# Patient Record
Sex: Female | Born: 1980 | Race: White | Hispanic: No | Smoking: Never smoker
Health system: Southern US, Community
[De-identification: ages and names within clinical notes are randomized; demographics above are authoritative.]

## PROBLEM LIST (undated history)

## (undated) DIAGNOSIS — M722 Plantar fascial fibromatosis: Secondary | ICD-10-CM

## (undated) DIAGNOSIS — R112 Nausea with vomiting, unspecified: Secondary | ICD-10-CM

## (undated) DIAGNOSIS — Z9889 Other specified postprocedural states: Secondary | ICD-10-CM

## (undated) DIAGNOSIS — K5792 Diverticulitis of intestine, part unspecified, without perforation or abscess without bleeding: Secondary | ICD-10-CM

## (undated) DIAGNOSIS — K219 Gastro-esophageal reflux disease without esophagitis: Secondary | ICD-10-CM

## (undated) DIAGNOSIS — N3010 Interstitial cystitis (chronic) without hematuria: Secondary | ICD-10-CM

## (undated) DIAGNOSIS — J45909 Unspecified asthma, uncomplicated: Secondary | ICD-10-CM

## (undated) DIAGNOSIS — Z87440 Personal history of urinary (tract) infections: Secondary | ICD-10-CM

## (undated) DIAGNOSIS — Z8619 Personal history of other infectious and parasitic diseases: Secondary | ICD-10-CM

## (undated) DIAGNOSIS — R17 Unspecified jaundice: Secondary | ICD-10-CM

## (undated) DIAGNOSIS — R519 Headache, unspecified: Secondary | ICD-10-CM

## (undated) DIAGNOSIS — Z8669 Personal history of other diseases of the nervous system and sense organs: Secondary | ICD-10-CM

## (undated) DIAGNOSIS — A1801 Tuberculosis of spine: Secondary | ICD-10-CM

## (undated) DIAGNOSIS — R51 Headache: Secondary | ICD-10-CM

## (undated) DIAGNOSIS — M797 Fibromyalgia: Secondary | ICD-10-CM

## (undated) DIAGNOSIS — M7072 Other bursitis of hip, left hip: Secondary | ICD-10-CM

## (undated) HISTORY — DX: Other bursitis of hip, left hip: M70.72

## (undated) HISTORY — DX: Unspecified jaundice: R17

## (undated) HISTORY — DX: Unspecified asthma, uncomplicated: J45.909

## (undated) HISTORY — DX: Other specified postprocedural states: Z98.890

## (undated) HISTORY — DX: Headache: R51

## (undated) HISTORY — DX: Personal history of urinary (tract) infections: Z87.440

## (undated) HISTORY — PX: CYSTOSCOPY WITH HYDRODISTENSION AND BIOPSY: SHX5127

## (undated) HISTORY — DX: Tuberculosis of spine: A18.01

## (undated) HISTORY — DX: Plantar fascial fibromatosis: M72.2

## (undated) HISTORY — DX: Diverticulitis of intestine, part unspecified, without perforation or abscess without bleeding: K57.92

## (undated) HISTORY — DX: Personal history of other infectious and parasitic diseases: Z86.19

## (undated) HISTORY — DX: Headache, unspecified: R51.9

## (undated) HISTORY — DX: Personal history of other diseases of the nervous system and sense organs: Z86.69

## (undated) HISTORY — DX: Fibromyalgia: M79.7

## (undated) HISTORY — DX: Interstitial cystitis (chronic) without hematuria: N30.10

## (undated) HISTORY — DX: Gastro-esophageal reflux disease without esophagitis: K21.9

---

## 2003-02-16 HISTORY — PX: DIAGNOSTIC LAPAROSCOPY: SUR761

## 2003-12-27 ENCOUNTER — Ambulatory Visit: Payer: Self-pay

## 2004-01-03 ENCOUNTER — Ambulatory Visit: Payer: Self-pay | Admitting: Urology

## 2004-01-03 DIAGNOSIS — N3010 Interstitial cystitis (chronic) without hematuria: Secondary | ICD-10-CM

## 2004-01-03 HISTORY — DX: Interstitial cystitis (chronic) without hematuria: N30.10

## 2004-05-21 ENCOUNTER — Ambulatory Visit: Payer: Self-pay

## 2005-12-16 ENCOUNTER — Ambulatory Visit: Payer: Self-pay

## 2006-01-13 ENCOUNTER — Inpatient Hospital Stay (HOSPITAL_COMMUNITY): Admission: AD | Admit: 2006-01-13 | Discharge: 2006-01-15 | Payer: Self-pay | Admitting: Neurology

## 2006-01-29 ENCOUNTER — Ambulatory Visit: Payer: Self-pay | Admitting: Obstetrics and Gynecology

## 2006-01-29 DIAGNOSIS — Z9889 Other specified postprocedural states: Secondary | ICD-10-CM

## 2006-01-29 HISTORY — DX: Other specified postprocedural states: Z98.890

## 2007-12-14 ENCOUNTER — Ambulatory Visit: Payer: Self-pay | Admitting: Family Medicine

## 2007-12-20 ENCOUNTER — Ambulatory Visit: Payer: Self-pay | Admitting: Nurse Practitioner

## 2008-01-10 ENCOUNTER — Ambulatory Visit: Payer: Self-pay | Admitting: Nurse Practitioner

## 2008-03-06 ENCOUNTER — Ambulatory Visit: Payer: Self-pay | Admitting: Nurse Practitioner

## 2008-05-15 ENCOUNTER — Ambulatory Visit: Payer: Self-pay | Admitting: Nurse Practitioner

## 2008-05-22 ENCOUNTER — Ambulatory Visit: Payer: Self-pay | Admitting: Family Medicine

## 2008-06-12 ENCOUNTER — Ambulatory Visit: Payer: Self-pay | Admitting: Nurse Practitioner

## 2008-10-09 ENCOUNTER — Ambulatory Visit: Payer: Self-pay | Admitting: Family Medicine

## 2008-10-09 ENCOUNTER — Encounter: Payer: Self-pay | Admitting: Family Medicine

## 2008-11-08 ENCOUNTER — Ambulatory Visit: Payer: Self-pay | Admitting: Rheumatology

## 2008-12-25 ENCOUNTER — Ambulatory Visit: Payer: Self-pay | Admitting: Obstetrics & Gynecology

## 2010-04-10 ENCOUNTER — Ambulatory Visit: Payer: Self-pay

## 2010-04-10 DIAGNOSIS — T192XXA Foreign body in vulva and vagina, initial encounter: Secondary | ICD-10-CM

## 2010-05-09 NOTE — Assessment & Plan Note (Signed)
NAME:  Margaret, Silva          ACCOUNT NO.:  [id number]  MEDICAL RECORD NO.:  [id number]           PATIENT TYPE:  LOCATION:  [facility] at [facility]           FACILITY:  PHYSICIAN:  Catalina Antigua, MD          DATE OF BIRTH:  DATE OF SERVICE:  04/10/2010                                 CLINIC NOTE  This is a 30 year old who presents to the office today for removal of a condom.  The patient states that after having intercourse yesterday evening they could not find condom and it seems that it was dropped in the vagina.  The patient was not able to palpate it or feel to remove the condom and thus presents today.  The patient is without any other complaints.  She is using IUD for birth control.  PHYSICAL EXAMINATION:  Blood pressure is 88/66, pulse of 112, weight of 174 pounds.  On pelvic exam, she had normal vaginal mucosa and normal- appearing cervix.  No abnormal bleeding or discharge and condom was visualized in the posterior vaginal fornix and removed with a ring forceps without any difficulty.  The patient tolerated procedure well. The patient is due for her annual exam in August 2012.          ______________________________ Catalina Antigua, MD    PC/MEDQ  D:  04/10/2010  T:  04/11/2010  Job:  [id number]

## 2010-05-26 HISTORY — PX: SIGMOIDOSCOPY: SUR1295

## 2010-07-01 NOTE — Assessment & Plan Note (Signed)
Margaret Silva, FAROOQ          ACCOUNT NO.:  [id number]   MEDICAL RECORD NO.:  [id number]          PATIENT TYPE:  POB   LOCATION:  [facility] at [facility]         FACILITY:  [facility]   PHYSICIAN:  Johnella Moloney, MD        DATE OF BIRTH:  [birth year]   DATE OF SERVICE:  03/06/2008                                  CLINIC NOTE   HISTORY OF PRESENT ILLNESS:  The patient comes to the office today,  followup up on her migraine headaches.  The patient has been going to  integrative therapy approximately 1-2 times per week.  She has yet not  seen a Veterinary surgeon.  She does feel that she has more depression that work  is more stressful and she is more easily irritated with people at work.  She does feel that she has some seasonal depression.  She does not again  see a counselor or psychiatrist.  Approximately 4-6 weeks ago, her  mother called in saying that the patient was having sad headache.  She  was called in prednisone as well as a Vicodin and Phenergan.  The  patient did not respond to that.  Mother called back several times.  The  patient had been out of work for 1 week.  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  She did see Dr.  Corliss Skains.  She did get her liver tests done on February 22, 2008.  She  has not gotten those results.  She feels that the Norflex that she has  been taking for the tension-type headaches are causing her to be  nauseated.  She does feel that they further help with her headaches.  We  did review her medications, which are rather extensive.  I did want to  have a formalized listing of all the people that she sees and  medications that are given by these people.  She sees Dr. Corliss Skains who  gives her Provigil, Depakote, Darvocet, and Vicodin.  She sees Dr. Achilles Dunk  for Urology who gives her the Elmiron.  She sees a PA named Technical sales engineer at [facility] for her IBS and gets the  Cymbalta from her.  She has also  gotten that prescription from me.  She sees Dr. Gwen Pounds at [facility] for the doxycycline.  She sees Dr. Sheran Spine at [facility] for the  Claritin and her allergy injections.  She was being seen at [facility].  She is no longer being seen at [facility].  She was  getting the Provigil from them.  She also sees Dr. Dewaine Oats at  [facility] and gets Ambien and Valtrex.   ASSESSMENT AND PLAN:  1. Depression, worsening.  2. Migraine.  3. Irritable bowel syndrome.  4. Fibromyalgia.  5. Anxiety.  6. Insomnia.  7. Herpes simplex virus.  8. Attention deficit hyperactivity disorder.   ASSESSMENT AND PLAN:  We had a lengthy discussion concerning counseling.  The patient has not made an appointment to see a counselor, though she  realizes that her depression is worsening.  She  has not been seen by a  psychiatrist.  It is my opinion that she needs to be evaluated for a  worsening of her depression and her other psychiatric disorders by a  psychiatrist.  1. She is referred today to Dr. Emerson Monte to be seen as soon as      possible for depression as well as medication review and      management.  2. We will discontinue the Norflex, start her on Robaxin 750 one p.o.      t.i.d. #60 with 2 refills.  3. The patient has denied any short-term disability.  4. We do need to look for those results from her liver test to note      liver toxicity with the Depakote.  The patient is asked to return      in 2 months or sooner.      Remonia Richter, NP    ______________________________  Johnella Moloney, MD    LR/MEDQ  D:  03/06/2008  T:  03/06/2008  Job:  [id number]

## 2010-07-01 NOTE — Assessment & Plan Note (Signed)
NAME:  Margaret Silva, Margaret Silva          ACCOUNT NO.:  [id number]   MEDICAL RECORD NO.:  [id number]          PATIENT TYPE:  POB   LOCATION:  [facility] at [facility]         FACILITY:  [facility]   PHYSICIAN:  Argentina Donovan, MD        DATE OF BIRTH:  [birth year]   DATE OF SERVICE:                                  CLINIC NOTE   HISTORY OF PRESENT ILLNESS:  The patient comes to the office today for  headache consultation.  The patient is currently being seen at [facility].  She is not unhappy with her care there, but there is a  significant time trouble for her.  The patient has been having headaches  since she was approximately 62 or 30 years old.  She does not have aura  with her headache.  She currently has headaches located in her right or  left temple.  They can go to her right or left occipital region and then  over her entire head.  She currently has 5 mild headaches per month, 20  moderate headaches per month, and 4 severe headaches per month.  Coexisting illnesses include occipital neuralgia, muscle spasm, IBS,  fibromyalgia, depression, anxiety, seasonal allergies, insomnia, HSV,  ADD, and ADHD.  The patient triggers as far as she knows are mostly  involved with stress.  She is currently in some financial difficulty of  approximately 30,000 dollars, this is related to past hospitalizations  and she is currently living at home with her parents attempting to pay  off her debt.  She currently works at [employer].  She is divorced.  No  children.  Dating the same man for almost 1 year.  She denies any  problems at home.  In fact, she is doing better now with her living  situation than she has been.  She is currently using Mirena IUD since  December 2007.  Dr. Yetta Barre had put that in.  She does see Dr. Corliss Skains  for her fibromyalgia.  She does believe that she has had recent liver  checks on her Depakote.   CURRENT MEDICATIONS:  The patient is currently taking multiple  medications just to  break this down from the headache perspective.  She  is taking Depakote and Cymbalta and Frova, Vicodin and Darvocet.  For an  IBS standpoint, she takes Librarian, academic which is an over-the-counter medicine.  For her fibromyalgia, she takes Provigil.  For depression, she is on  Cymbalta.  She was taking Wellbutrin and is currently not taking it due  to money issues.  For her allergies, she takes Claritin as well as  allergy shots.  For her acne, she takes doxycycline.  For sleep, she  takes Ambien.  Over-the-counter medicines, she takes 3-4 Tylenol or  ibuprofen two times per day.  Her last Vicodin was on Halloween, she  took one.  She admits that she takes this on a relatively rare basis.  In the past, she has taken Topamax, imipramine, Relpax, and lidocaine  patches.  Medication management has included counseling, icy hot  massage, and exercise.   ALLERGIES:  The patient does not have any allergies to any medications.   MENSTRUAL HISTORY:  The patient is on  IUD.   OBSTETRICAL HISTORY:  G0, P0.   GYN HISTORY:  She has had an abnormal Pap in the past and that was in  2007.  She has had a colposcopy and a repeat Pap.  She does have HSV.   SURGICAL HISTORY:  She has had a colonoscopy.  She has had  hydrodistillation.  She has had a laparoscopy, it looks like twice.   PERSONAL MEDICAL HISTORY:  The patient has had jaundice, kidney  problems, asthma, high cholesterol, fibromyalgia, allergies, migraines,  and IBS.   SOCIAL HISTORY:  She lives at home with her mother, father, younger  sister who is 91 years old, and another sister who is currently  pregnant.  She does work outside of the home.  She does not smoke.  She  drinks one alcohol beverage per week.  She does drink caffeinated  beverages if she gets a migraine that will not go away.  She is not  being sexually abused.   REVIEW OF SYSTEMS:  The patient admits to bruising, muscle aches,  fevers, fatigue, weight gain, frequent headaches,  dizzy spells, cough,  problems with breathing, nausea, vomiting.  The patient did have the flu  last week.   PHYSICAL EXAMINATION:  VITAL SIGNS:  Blood pressure is 118/73, pulse is  98, and weight is 157.   ASSESSMENT:  1. Migraine without aura.  2. Occipital neuralgia.  3. Muscle spasm.  4. Irritable bowel syndrome.  5. Fibromyalgia.  6. Depression.  7. Anxiety.  8. Insomnia.   PLAN:  1. We spent well over 1 hour together today.  We reviewed all of her      above information and made some changes to her medication regime.      We will go upon her Depakote from 500 mg to 75 mg.  She got #45      with 3 refills.  She will contact Dr. Corliss Skains to make sure that      she had a liver panel done with her last blood draw.  2. The patient is asked eliminate all of her over-the-counter      medications.  She will substitute Norflex 100 mg one p.o. b.i.d.      p.r.n. muscle spasm, #60 with 2 refills.  The patient is given a      prescription for a TENS unit to use on her neck for muscle spasm,      this is something that she can use at work.  She is encouraged not      to use it at bedtime.  The patient is also given a prescription to      [facility] to [facility].  At [facility], they will      evaluate and treat her for any of the modalities including      biofeedback and massage or physical therapy.  We did have a      discussion about this patient's anxiety, and appears that she has a      longstanding history of some mental issues including attention      deficit disorder, attention deficit hyperactivity disorder, and      learning disability as a child.  She was asked to revisit her      counselor and consider returning to her Wellbutrin.  She will      return to this office in 2 weeks, and we will consider alternative      therapy at that time.  Remonia Richter, NP    ______________________________  Argentina Donovan, MD    LR/MEDQ  D:  12/20/2007  T:   12/21/2007  Job:  [id number]

## 2010-07-01 NOTE — Assessment & Plan Note (Signed)
NAME:  Margaret Silva, Margaret Silva          ACCOUNT NO.:  [id number]   MEDICAL RECORD NO.:  [id number]          PATIENT TYPE:  POB   LOCATION:  [facility] at [facility]         FACILITY:  [facility]   PHYSICIAN:  Argentina Donovan, MD        DATE OF BIRTH:  1980/09/13   DATE OF SERVICE:                                  CLINIC NOTE   The patient comes to office today for a followup on her migraine  headaches.  The patient has been to the counselor at [facility].  She met with her several times over several weeks.  They  discussed her job and boyfriend and home problems.  She and her  boyfriend did break up for a while related to his drinking, his temper,  and his controlling issues.  She said that things have improved.  She  also feels that things have improved at work.  She has gotten better  hours in general.  She has not seen a psychiatrist.  The patient states  that she is off the Vicodin and Darvocet.  She overall feels better.  She is going to be going out of the country in a few months with her  church, do a medical mission, and needs her tetanus and Twinrix for  hepatitis A, hepatitis B.   PHYSICAL EXAMINATION:  GENERAL:  Well-developed, well-nourished, 30-year-  old Caucasian female in no acute distress.  VITAL SIGNS:  Blood pressure is 113/78, pulse is 111, weight is 162, and  height is 5 feet 4 inches.  HEENT:  Head is normocephalic and atraumatic.  NEUROLOGIC:  The patient is alert and oriented.  She seems less sleepy  today and more alert and functioning   ASSESSMENT:  1. Migraine headache.  The patient is doing well.  She is off the      narcotics, hopefully things will improve for her.  She has not      requested any new medications nor is she asking for refill on her      narcotic.  2. Travel out of the country, the patient today was given tetanus and      Twinrix.  She will return to the office in 3 months and she was      wished good luck with her mission.      Remonia Richter, NP    ______________________________  Argentina Donovan, MD    LR/MEDQ  D:  05/15/2008  T:  05/16/2008  Job:  [id number]

## 2010-07-01 NOTE — Assessment & Plan Note (Signed)
NAME:  Margaret Silva, Margaret Silva          ACCOUNT NO.:  [id number]   MEDICAL RECORD NO.:  [id number]          PATIENT TYPE:  POB   LOCATION:  [facility] at [facility]         FACILITY:  [facility]   PHYSICIAN:  Johnella Moloney, MD        DATE OF BIRTH:  Oct 28, 1980   DATE OF SERVICE:  01/10/2008                                  CLINIC NOTE   The patient comes to the office today for followup on her migraine  headaches.  Since her last office visit, she has done significantly  better.  She has only had 1 severe headache to moderate headaches and no  mild.  She has gotten off all of her over-the-counter medications.  She  has been using the Norflex, which does seem to be helpful.  She has also  gone up on her Depakote to 750 mg and that has been helpful.  She did  get a TENS unit.  She uses that at work and she likes it very much.  She  also went to integrative therapy and they are doing an evaluation and  will start her therapy including counseling 2 times per week.  She has  not yet asked Dr. Dr. Corliss Skains about her liver panel, but she has made  herself a note and she will do that.  She feels overall that she is  doing better with her life stressors.  She met with her parents as well  as a Psychologist, counselling and they did discuss her outstanding  debts and she is on a repayment plan that she feels that she can handle  and she is in emotionally a better place with that.  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  She is asking for a new  prescription for Cymbalta.  Currently, she has 2 prescriptions for  Cymbalta, one for 30 mg, one for 60 mg.  We will combine that for a 1  prescription to read Cymbalta 30 mg 1 p.o. t.i.d. #90 with 11 refills.  The patient was asked to return in 2 months or sooner if need be.      Remonia Richter, NP    ______________________________  Johnella Moloney, MD    LR/MEDQ  D:   01/10/2008  T:  01/11/2008  Job:  [id number]

## 2010-07-01 NOTE — Assessment & Plan Note (Signed)
NAME:  Margaret Silva, Margaret Silva          ACCOUNT NO.:  [id number]   MEDICAL RECORD NO.:  [id number]          PATIENT TYPE:  POB   LOCATION:  [facility]         FACILITY:  [facility]   PHYSICIAN:  Scheryl Darter, MD       DATE OF BIRTH:  1980-04-13   DATE OF SERVICE:  12/25/2008                                  CLINIC NOTE   The patient comes to office today for IUD check.  The patient states  that she is unable to feel the strength.  She has had her IUD placed in  2007 without any difficulties.   PHYSICAL EXAMINATION:  GENERAL:  Well-developed, well-nourished 30-year-  old Caucasian female, in no acute distress.  VITAL SIGNS:  Blood pressure is 118/85, pulse is 76, weight 159, height  is 5 feet 4 inches.  GENITALIA:  Externally, no lesions or discharges.  Internally, there is  a small amount of white vaginal discharge.  The cervix is closed.  The  strings are only apparent by a half an inch.  They are visible though.  Bimanual exam, no cervical motion tenderness.  No adnexal mass.   ALLERGIES:  No known allergies.   ASSESSMENT:  Contraception.   PLAN:  IUD in place.  Follow up as necessary.      Remonia Richter, NP    ______________________________  Scheryl Darter, MD    LR/MEDQ  D:  12/25/2008  T:  12/26/2008  Job:  [id number]

## 2010-07-01 NOTE — Assessment & Plan Note (Signed)
NAME:  Margaret Silva, Margaret Silva          ACCOUNT NO.:  [id number]   MEDICAL RECORD NO.:  [id number]          PATIENT TYPE:  POB   LOCATION:  [facility]         FACILITY:  [facility]   PHYSICIAN:  Tinnie Gens, MD        DATE OF BIRTH:  08/26/1980   DATE OF SERVICE:                                  CLINIC NOTE   The patient comes to the office today for her yearly Pap, pelvic, and  breast exam.  Overall, the patient is doing very well.  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  She is  considering attempting to adopt one of these children.  She and her  boyfriend became engaged upon her return from [place].  She is currently  using the Mirena IUD for birth control.  She will have that changed out  in 2012.  She has not had any complaints of any vaginal discharge or any  difficulties with the Mirena, she does have a menstrual cycle at this  point.  She does have some problems with her sleep.  She has difficulty  falling asleep.  She has been taking Ambien, which does not seem to be  working that well for her.  She was taking Provigil at one point during  the day and her insurance company has denied this as she has not had a  routine sleep study.  With combination of these two issues, she is  requesting a sleep study today.   ALLERGIES:  No known drug allergies.   PHYSICAL EXAMINATION:  VITAL SIGNS:  Blood pressure is 103/71, pulse is  90, weight is 160, height is 5 feet 4 inches.  HEENT:  Head is normocephalic and atraumatic.  Pupils equal, round, and  reactive.  NECK:  Thyroid is not enlarged.  There are no nodules.  CARDIAC:  Regular rate and rhythm.  LUNGS:  Clear bilaterally without rales, rhonchi, wheezes.  BREASTS:  Symmetrical.  There is no skin dimpling.  There is no  discharge from the nipples.  There is no mass appreciated.  ABDOMEN:  Soft, nontender.  No organomegaly noted.  EXTREMITIES:  Warm, dry with good pulses.  GENITALIA:  Externally were are no lesions or discharges.  The mons was  shaved.  Internally, vaginal vault has a scant amount of white discharge  only.  CERVIX:  The IUD strings are present, otherwise negative.   Bimanual Exam:  There is no cervical motion tenderness.  No adnexal  mass.   ASSESSMENT:  1. Yearly Pap, pelvic, and breast exam.  2. Insomnia.  Consider difficulty falling asleep and maintaining      sleep.  3. Migraine headaches.   PLAN:  The patient was encouraged to continue with her routine  medications that she has been placed on in the past for migraine  prevention.  She will also have a sleep study ordered in [place].  She will continue with the Mirena  IUD for contraception.  She will continue to practice good health  habits.  She will return in 1 year or sooner as needed.  Remonia Richter, NP    ______________________________  Tinnie Gens, MD    LR/MEDQ  D:  10/09/2008  T:  10/10/2008  Job:  [id number]

## 2010-07-04 NOTE — Discharge Summary (Signed)
NAMESHERITTA, DEEG          ACCOUNT NO.:  [id number]   MEDICAL RECORD NO.:  [id number]          PATIENT TYPE:  INP   LOCATION:  3004                         FACILITY:  [facility]   PHYSICIAN:  Marlan Palau, M.D.  DATE OF BIRTH:  1980/07/21   DATE OF ADMISSION:  01/13/2006  DATE OF DISCHARGE:                               DISCHARGE SUMMARY   ADMISSION DIAGNOSIS:  1. Status migrainosus.  2. Fibromyalgia.  3. Attention deficit hyperactivity disorder.   DISCHARGE DIAGNOSES:  1. Status migrainosus, resolved with D.H.E. 45 protocol.  2. Interstitial cystitis.  3. Attention deficit hyperactivity disorder.  4. Fibromyalgia.   PROCEDURES:  During this admission included an MRI scan of the brain.  Complications from the above procedures:  None.   HISTORY OF PRESENT ILLNESS:  Margaret Silva is a 30 year old, right-  handed, white female born 05-19-1980, with a history of  intractable headaches.  The patient has had headaches dating back more  than 10 years, but the headaches have been relatively infrequent on  average once a month.  Since the beginning of October2007, the  patient has had converted migraine.  The patient has had daily headache  events.  Headaches are worse when she wakes up from sleep or from a nap.  The patient recently received a Depo-Provera shot.  The patient's  headaches have been persistent associated nausea, occasional vomiting.  The patient has not been able to work.  The patient has been given very  brief trials on Topamax, Depakote, Relpax, prednisone.  The patient only  took medications such as Depakote for 2 days so before stopping them.  The patient denies any focal numbness, weakness on the face, arms, or  legs.  The patient was admitted for D.H.E. 45 protocol.   PAST MEDICAL HISTORY:  1. History of migraine headaches with converted migraine.  2. Interstitial cystitis.  3. ADHD.  4. Fibromyalgia.  5. History of pyelonephritis in the  past.   MEDICATIONS:  1. Wellbutrin 150 mg every other day.  2. Cymbalta 60 mg daily.  3. Claritin 10 mg day.  4. Ambien 10 mg day.  5. Depo-Provera shots every 3 months.  6. Valtrex 500 mg daily.   THE PATIENT HAS AN ALLERGY TO CIPRO.   Does not smoke.  Does drink alcohol on occasion.   Please refer to history and physical dictation summary for social  history, family history, review of systems, physical examination.   LABORATORY VALUES:  Notable for sodium 135, potassium 4.1, chloride 105,  CO2 23, glucose of 82, BUN 12, creatinine 0.9, calcium 9.4, total  protein 6.4, albumin of 3.8, AST of 16, ALT of 33, alkaline phosphatase  of 72, total bili 0.5.  White count of 5.7, hemoglobin 14, hematocrit  40.6, MCV of 93.8, platelets of 269.  TSH 2.497.  Sed rate of 4.   HOSPITAL COURSE:  The patient was admitted [facility].  An IV  was placed.  The patient was started on D.H.E. 45 protocol consisting of  1 mg IV q.8 h for nine doses, pre-medicated with Reglan 10 mg IV 30  minutes prior  to the D.H.E. 45 dosing, and with Solu-Medrol 100 mg IV  q.8 h for nine doses.   The patient was sent for an MRI scan of the brain.  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  Otherwise the brain was normal.  The  patient has gained good improvement with the D.H.E. 45.  Within the  first 24 hours of admission the headache was gone.  The patient has had  no headache now for 18 hours.  At this time, the patient will be  discharged to home.   DISCHARGE MEDICATION:  1. Claritin 10 mg day.  2. Ambien 10 mg at night if needed.  3. Cymbalta 60 mg one at night.  4. Valtrex 500 mg daily.  5. Elmiron 100 mg one three times daily.  6. Depo-Provera shots every 3 months.  7. Wellbutrin 150 mg one every other day.  8. Migranal nasal spray, up to 4 sprays daily if needed.  9. Amitriptyline was started 25 mg, one at night.   The patient will follow up with Dr. Vickey Huger within the next  four to six  weeks.   The patient may return to work if she is feeling well by early December,  around the 3rd of 2007.   The patient will contact our office if she has any further problems.      Marlan Palau, M.D.  Electronically Signed     CKW/MEDQ  D:  01/15/2006  T:  01/15/2006  Job:  [id number]

## 2010-07-04 NOTE — H&P (Signed)
NAMEMIRYAM, MCELHINNEY          ACCOUNT NO.:  [id number]   MEDICAL RECORD NO.:  [id number]          PATIENT TYPE:  INP   LOCATION:  3004                         FACILITY:  [facility]   PHYSICIAN:  Marlan Palau, M.D.  DATE OF BIRTH:  [birth year]   DATE OF ADMISSION:  01/13/2006  DATE OF DISCHARGE:                              HISTORY & PHYSICAL   HISTORY OF PRESENT ILLNESS:  Margaret Silva is a 30 year old right-  handed white female, born 01-05-1981, with a history of  intractable headaches.  This patient has had a history of migraines  dating back greater than a decade, but the headaches generally have been  on average once a month.  Towards the beginning of October, 2007,  patient had converted migraine to daily events.  The headaches generally  are worse when she first wakes up from a nap.  Patient had recently  received a Depo-Provera shot.  Patient's headaches have been persistent,  associated with some nausea, occasional vomiting.  Patient has not been  able to work, and patient has not responded to various medications,  including Topamax, Depakote, Relpax, prednisone.  Patient denies any  focal numbness or weakness on face, arms, or legs.  Has not had a recent  head scan other than a sinus CT evaluation.  Patient is brought in to  the hospital at this point for a DHEA-45 protocol for status  migrainosus.   PAST MEDICAL HISTORY:  Significant for:  1. A history of migraine headaches who have converted to migraine.  2. Interstitial headaches.  3. ADHD.  4. Fibromyalgia.  5. History of pyelonephritis in the past.   MEDICATIONS:  At this time include:  1. Wellbutrin 150 mg every other day.  2. Cymbalta 60 mg daily.  3. Claritin 10 mg a day.  4. Ambien 10 mg at night.  5. Depo-Provera shots every 3 months.  6. Valtrex 500 mg a day.   ALLERGIES:  Patient states an ALLERGY to CIPRO.   Does not currently smoke, drinks alcohol on occasion.   SOCIAL HISTORY:  This  patient is divorced, lives with her mother in [place], currently is on disability from her job  because of the headaches.   FAMILY MEDICAL HISTORY:  Notable for diabetes in a maternal grandmother,  hypertension in father, stroke in a maternal grandfather.   REVIEW OF SYSTEMS:  Notable for no recent fevers, chills.  Patient does  note the headache, dizziness, numbness in the feet at times, slow  thinking, poor memory, difficulty sleeping, blurred vision,  constipation, diarrhea, nausea, stomach upset, fatigue, neck pain,  history of asthma.  Otherwise 12-point review of systems is  unremarkable.   PHYSICAL EXAMINATION:  VITALS:  Blood pressure is 108/76, heart rate is  84 and regular, afebrile.  GENERAL:  This patient is a well-developed white female who is alert and  cooperative at the time of examination.  HEENT:  Head is atraumatic.  Eyes:  Pupils equal, round, react to light.  Disks are flat bilaterally.  NECK:  Supple.  No carotid bruits noted.  RESPIRATORY:  Clear.  CARDIOVASCULAR:  Reveals a regular rate and rhythm.  No obvious murmurs  or rubs noted.  EXTREMITIES:  Without significant edema.  NEUROLOGIC:  Cranial nerves as above.  Facial asymmetry is present.  Patient has good sensation of the face to pinprick, soft touch  bilaterally.  Full extraocular movements are noted.  Visual fields are  full, dull to simultaneous stimulation.  Speech is well enunciated and  not aphasic.  Pupils are round and react to light.  Disks are flat.  Motor test reveals 5/5 strength in all fours.  Good symmetric motor  tones are noted throughout.  Sensory testing intact to pinprick, soft  touch throughout.  Patient has good finger-to-nose-to-finger, heel-to -  shin.  Gait normal.  Tandem gait normal.  Romberg negative.  No evidence  of pronator drift is seen.  Deep tendon reflexes are symmetrical.  Normal toes downgoing bilaterally.  Range of movement in the neck is   fairly full and normal.   IMPRESSION:  1. History of status migrainosus.  2. Fibromyalgia.  3. Interstitial cystitis.   Patient likely has converted migraine.  Will admit this patient for IV  DHEA-45 protocol at this time.  Patient is currently incapacitated from  her headaches and not able to work.   PLAN:  1. Admission to [facility].  2. IV  fluids, normal saline.  3. DHEA-45, 1 mg IV q.8h. for 9 doses.  4. Reglan 10 mg IV prior to the DHEA-45 dose.  5. IV methylprednisolone 100 mg q.8h. for 9 doses.  6. Will obtain an MRI scan of brain during this admission and get      admission blood work.      Marlan Palau, M.D.  Electronically Signed     CKW/MEDQ  D:  01/13/2006  T:  01/14/2006  Job:  [id number]

## 2010-10-29 ENCOUNTER — Ambulatory Visit (INDEPENDENT_AMBULATORY_CARE_PROVIDER_SITE_OTHER): Payer: BC Managed Care – PPO | Admitting: Obstetrics & Gynecology

## 2010-10-29 ENCOUNTER — Encounter: Payer: Self-pay | Admitting: Obstetrics & Gynecology

## 2010-10-29 VITALS — BP 92/69 | HR 92 | Ht 64.0 in | Wt 160.0 lb

## 2010-10-29 DIAGNOSIS — Z30433 Encounter for removal and reinsertion of intrauterine contraceptive device: Secondary | ICD-10-CM

## 2010-10-29 NOTE — Progress Notes (Signed)
Patient here today to have Mirena removed and replaced; placed in 2007 for endometriosis.  Started having increased irregular bleeding and pain even though the Mirena is due to be removed in 01/2011.  She figured she does not have adequate levels of hormones; so she wants it replaced today.  No other symptoms.  Last pap smear was in January 2012 at [facility] and was normal.    The following portions of the patient's history were reviewed and updated as appropriate: allergies, current medications, past family history, past medical history, past social history, past surgical history and problem list.  IUD Procedure Note Patient identified, informed consent performed, signed copy in chart, time out was performed.   Patient was placed  in the dorsal lithotomy position, normal external genitalia was noted.  A speculum was placed in the patient's vagina, normal discharge was noted, no lesions. The multiparous cervix was visualized, no lesions, no abnormal discharge.  The strings of the IUD was grasped and pulled using ring forceps.  The IUD was successfully removed in its entirety.  Patient tolerated the procedure well.    Cervix was cleaned with Betadine x 2.  Grasped anteriorly with a single tooth tenaculum.  Uterus sounded to 7 cm.  Mirena IUD placed per manufacturer's recommendations.  Strings trimmed to 2 cm. Tenaculum was removed, good hemostasis noted.  Patient tolerated procedure well.   Patient given post procedure instructions and Mirena care card with expiration date.  Patient is asked to check IUD strings periodically and follow up in 4-6 weeks for IUD check; or earlier if needed for any concerning symptoms.

## 2010-12-03 ENCOUNTER — Encounter: Payer: Self-pay | Admitting: Family Medicine

## 2010-12-03 ENCOUNTER — Ambulatory Visit (INDEPENDENT_AMBULATORY_CARE_PROVIDER_SITE_OTHER): Payer: BC Managed Care – PPO | Admitting: Family Medicine

## 2010-12-03 DIAGNOSIS — I951 Orthostatic hypotension: Secondary | ICD-10-CM

## 2010-12-03 DIAGNOSIS — K5792 Diverticulitis of intestine, part unspecified, without perforation or abscess without bleeding: Secondary | ICD-10-CM | POA: Insufficient documentation

## 2010-12-03 DIAGNOSIS — N809 Endometriosis, unspecified: Secondary | ICD-10-CM | POA: Insufficient documentation

## 2010-12-03 DIAGNOSIS — G2581 Restless legs syndrome: Secondary | ICD-10-CM | POA: Insufficient documentation

## 2010-12-03 DIAGNOSIS — IMO0001 Reserved for inherently not codable concepts without codable children: Secondary | ICD-10-CM

## 2010-12-03 DIAGNOSIS — K5732 Diverticulitis of large intestine without perforation or abscess without bleeding: Secondary | ICD-10-CM

## 2010-12-03 DIAGNOSIS — K5289 Other specified noninfective gastroenteritis and colitis: Secondary | ICD-10-CM

## 2010-12-03 DIAGNOSIS — R000 Tachycardia, unspecified: Secondary | ICD-10-CM

## 2010-12-03 DIAGNOSIS — I498 Other specified cardiac arrhythmias: Secondary | ICD-10-CM | POA: Insufficient documentation

## 2010-12-03 DIAGNOSIS — K589 Irritable bowel syndrome without diarrhea: Secondary | ICD-10-CM | POA: Insufficient documentation

## 2010-12-03 DIAGNOSIS — G43909 Migraine, unspecified, not intractable, without status migrainosus: Secondary | ICD-10-CM

## 2010-12-03 DIAGNOSIS — M797 Fibromyalgia: Secondary | ICD-10-CM | POA: Insufficient documentation

## 2010-12-03 DIAGNOSIS — N3010 Interstitial cystitis (chronic) without hematuria: Secondary | ICD-10-CM | POA: Insufficient documentation

## 2010-12-03 DIAGNOSIS — K529 Noninfective gastroenteritis and colitis, unspecified: Secondary | ICD-10-CM

## 2010-12-03 DIAGNOSIS — G43109 Migraine with aura, not intractable, without status migrainosus: Secondary | ICD-10-CM | POA: Insufficient documentation

## 2010-12-03 MED ORDER — NORETHIN-ETH ESTRAD BIPHASIC 0.5-35/1-35 MG-MCG PO TABS: 1.0000 | ORAL_TABLET | Freq: Every day | ORAL | Status: DC

## 2010-12-03 NOTE — Patient Instructions (Signed)
Endometriosis  Cramps, Pain and Infertility     Up to ten per cent of women in child bearing years have endometriosis. Endometriosis is a disease that occurs when the endometrium (lining of the uterus) is misplaced outside of its normal location. It may occur in many locations close to the uterus (womb), but commonly on the ovaries, fallopian tubes, vagina (birth canal) and bowel located close to the uterus. Because the uterus sloughs (expels) its lining every month (menses), there is bleeding where ever the endometrial tissue is located.     SYMPTOMS  Often there are no symptoms (problems); however because blood is irritating to tissues not normally exposed to it, when symptoms occur they vary with the location of the misplaced endometrium. Symptoms often include back and abdominal (belly) pain. Periods may be heavier and intercourse may be painful. Infertility may be present. You may have all of these symptoms at one time or another or you may have months with no symptoms at all. Although the symptoms occur mainly during menses, they can occur mid-cycle as well, and usually terminate with menopause.     DIAGNOSIS  You will have to be seen by your caregiver for a diagnosis (learning what is wrong). Your caregiver may recommend a blood test and urine test (urinalysis) to help rule out other conditions. Another common test is ultrasound, a painless procedure that uses sound waves to make a sonogram "picture" of abnormal tissue that could be endometriosis. If your bowel movements are painful around your periods, your caregiver may advise a  barium enema (an x-ray of the lower bowel), to try to find the source of your pain. This is sometimes confirmed by laparoscopy. Laparoscopy is a procedure where your caregiver looks into your abdomen with a laparoscope (a small pencil sized telescope). Your caregiver may take a tiny piece of tissue (biopsy) from any abnormal tissue to confirm or document your problem. These tissues  are sent to the lab and a pathologist looks at them under the microscope to give a microscopic diagnoses.     TREATMENT  Once the diagnosis is made, it can be treated by destruction of the misplaced endometrial tissue using heat (diathermy), laser, cutting (excision), or chemical means. It may also be treated with hormonal therapy. When using hormonal therapy menses are eliminated, therefore eliminating the monthly exposure to blood by the misplaced endometrial tissue. Only in severe cases is it necessary to perform a hysterectomy with removal of the tubes, uterus and ovaries.     HOME CARE INSTRUCTIONS   Only take over-the-counter or prescription medicines for pain, discomfort, or fever as directed by your caregiver.    Avoid activities that produce pain, including a physical sexual relationship.   Do not take aspirin as this may increase bleeding when not on hormonal therapy.   See your caregiver for pain or problems not controlled with treatment.     SEEK IMMEDIATE MEDICAL CARE IF:   Your pain is severe and is not responding to pain medication.   You develop severe nausea and vomiting or can't keep foods down.   Your pain localizes to the right lower part of your abdomen (possible appendicitis).   There is swelling or increasing pain in the abdomen.    An unexplained oral temperature above 102 F (38.9 C) develops, or as your caregiver suggests.   You see blood in your stool.     MAKE SURE YOU:    Understand these instructions.    Will watch your 

## 2010-12-03 NOTE — Progress Notes (Signed)
  Subjective:    Patient ID: Margaret Silva, female    DOB: [birth year], 30 y.o.   MRN: [id number]  HPI  Here for IUD string check.  Reports some spotting which is causing pain.  Would like to stop.  Prev. On Mirena with amenorrhea and therefore controlled pain.  Review of Systems  Constitutional: Negative for chills, activity change and fatigue.  Respiratory: Negative for shortness of breath.   Cardiovascular: Negative for chest pain and leg swelling.  Gastrointestinal: Positive for abdominal pain. Negative for diarrhea and constipation.  Genitourinary: Positive for pelvic pain.       Objective:   Physical Exam  Vitals reviewed. Constitutional: She appears well-developed and well-nourished.  HENT:  Head: Normocephalic.  Cardiovascular: Normal rate and regular rhythm.   Pulmonary/Chest: Effort normal.  Abdominal: Soft.  Genitourinary: Vagina normal.       IUD strings are seen.  Neurological: She is alert.  Skin: Skin is warm.          Assessment & Plan:  IUD in place. Trial of OC's to make bleeding stop and pain improve.

## 2011-08-19 ENCOUNTER — Ambulatory Visit: Payer: BC Managed Care – PPO | Admitting: Obstetrics & Gynecology

## 2012-03-21 ENCOUNTER — Emergency Department: Payer: Self-pay | Admitting: Emergency Medicine

## 2012-03-21 LAB — COMPREHENSIVE METABOLIC PANEL
Albumin: 3.7 g/dL (ref 3.4–5.0)
Alkaline Phosphatase: 89 U/L (ref 50–136)
Anion Gap: 6 — ABNORMAL LOW (ref 7–16)
BUN: 13 mg/dL (ref 7–18)
Bilirubin,Total: 0.3 mg/dL (ref 0.2–1.0)
Calcium, Total: 8.3 mg/dL — ABNORMAL LOW (ref 8.5–10.1)
Chloride: 111 mmol/L — ABNORMAL HIGH (ref 98–107)
Co2: 23 mmol/L (ref 21–32)
EGFR (Non-African Amer.): >60
Glucose: 117 mg/dL — ABNORMAL HIGH (ref 65–99)
Potassium: 4.1 mmol/L (ref 3.5–5.1)
SGOT(AST): 13 U/L — ABNORMAL LOW (ref 15–37)
SGPT (ALT): 18 U/L (ref 12–78)

## 2012-03-21 LAB — CBC
HCT: 40.7 % (ref 35.0–47.0)
MCH: 32.7 pg (ref 26.0–34.0)
Platelet: 260 10*3/uL (ref 150–440)
RDW: 13.4 % (ref 11.5–14.5)
WBC: 12.1 10*3/uL — ABNORMAL HIGH (ref 3.6–11.0)

## 2012-03-21 LAB — URINALYSIS, COMPLETE
Bacteria: NONE SEEN
Blood: NEGATIVE
Glucose,UR: NEGATIVE mg/dL (ref 0–75)
Leukocyte Esterase: NEGATIVE
Nitrite: NEGATIVE
Protein: NEGATIVE
RBC,UR: 1 /HPF (ref 0–5)
Specific Gravity: 1.02 (ref 1.003–1.030)
Squamous Epithelial: 2
WBC UR: <1 /HPF (ref 0–5)

## 2012-10-21 ENCOUNTER — Encounter: Payer: Self-pay | Admitting: Adult Health

## 2012-10-21 ENCOUNTER — Ambulatory Visit (INDEPENDENT_AMBULATORY_CARE_PROVIDER_SITE_OTHER): Payer: BC Managed Care – PPO | Admitting: Adult Health

## 2012-10-21 VITALS — BP 110/60 | HR 96 | Temp 98.4°F | Resp 14 | Ht 64.25 in | Wt 167.5 lb

## 2012-10-21 DIAGNOSIS — G43909 Migraine, unspecified, not intractable, without status migrainosus: Secondary | ICD-10-CM

## 2012-10-21 DIAGNOSIS — K589 Irritable bowel syndrome without diarrhea: Secondary | ICD-10-CM

## 2012-10-21 MED ORDER — DULOXETINE HCL 30 MG PO CPEP: 90.0000 mg | ORAL_CAPSULE | Freq: Every day | ORAL | Status: DC

## 2012-10-21 MED ORDER — ZOLPIDEM TARTRATE 10 MG PO TABS: 10.0000 mg | ORAL_TABLET | Freq: Every day | ORAL | Status: DC

## 2012-10-21 MED ORDER — VALACYCLOVIR HCL 500 MG PO TABS: 500.0000 mg | ORAL_TABLET | Freq: Every day | ORAL | Status: DC

## 2012-10-21 MED ORDER — DIVALPROEX SODIUM 500 MG PO DR TAB: 500.0000 mg | DELAYED_RELEASE_TABLET | Freq: Every day | ORAL | Status: DC

## 2012-10-21 NOTE — Assessment & Plan Note (Signed)
Well-controlled on current medication regimen. Patient reports occasional migraines. Continue to follow.

## 2012-10-21 NOTE — Assessment & Plan Note (Signed)
Symptoms are currently well controlled. She reports that certain foods trigger symptoms; although, a certain food that has been a trigger in the past may not produce symptoms at other times. Patient has had colonoscopy 2004.

## 2012-10-21 NOTE — Progress Notes (Signed)
Subjective:    Patient ID: Margaret Silva, female    DOB: [birth year], 32 y.o.   MRN: [id number]  HPI  Patient is a pleasant 32 year old female who presents to clinic to establish care. She was previously followed by Dr. Alison Murray who has moved out of the area. Patient has no concerns today. She reports having a complete physical either in January or February including a Pap smear. Will request medical records.   Past Medical History  Diagnosis Date  . History of colonoscopy 11/04 and April 06  . History of laparoscopy 12/04 and 12/07  . Interstitial cystitis 01/03/2004    hydrodistilation  . 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 History of laparoscopy 01/29/2006    Dr. Toya Smothers Endometriosis  . Endometriosis   . Asthma   . History of chicken pox   . Diverticulitis     h/o  . GERD (gastroesophageal reflux disease)   . Hx of migraines   . Jaundice     h/o  . Hx: UTI (urinary tract infection)      Past Surgical History  Procedure Laterality Date  . Diagnostic laparoscopy  02/16/2003  . Sigmoidoscopy  05/26/10  . Cystoscopy with hydrodistension and biopsy       Family History  Problem Relation Age of Onset  . Heart attack Paternal Grandfather   . Cancer Paternal Grandfather     Pancreatic/ Bladder Cancer/prostate  . Arthritis Paternal Grandfather   . Heart disease Paternal Grandfather   . Cancer Paternal Grandmother     Liver cancer/lung cancer  . Diabetes Paternal Grandmother   . Diabetes Maternal Grandmother   . Arthritis Maternal Grandmother   . Kidney disease Maternal Grandmother   . Heart attack Maternal Grandfather   . Heart disease Maternal Grandfather   . Hypertension Maternal Grandfather   . Mental illness Maternal Grandfather   . Hypertension Father   . Hyperlipidemia Father      History   Social History  . Marital Status: Divorced    Spouse Name: N/A    Number of Children: N/A  . Years of Education:  N/A   Occupational History  . Not on file.   Social History Main Topics  . Smoking status: Never Smoker   . Smokeless tobacco: Never Used  . Alcohol Use: Yes     Comment: occasionally  . Drug Use: No  . Sexual Activity: Yes    Birth Control/ Protection: Implant   Other Topics Concern  . Not on file   Social History Narrative  . No narrative on file     Review of Systems  Constitutional: Negative.   Eyes: Negative.   Respiratory: Negative.   Cardiovascular: Negative.   Gastrointestinal: Positive for diarrhea and constipation.       History of IBS  Genitourinary: Negative.        History of interstitial cystitis  Musculoskeletal: Negative.   Skin: Negative.   Neurological: Positive for headaches.       History of migraine headaches  Hematological: Negative.   Psychiatric/Behavioral: Negative.      BP 110/60  Pulse 96  Temp(Src) 98.4 F (36.9 C) (Oral)  Resp 14  Ht 5' 4.25" (1.632 m)  Wt 167 lb 8 oz (75.978 kg)  BMI 28.53 kg/m2  SpO2 97%     Objective:   Physical Exam  Constitutional: She is oriented to person, place, and time. She appears well-developed and well-nourished.  HENT:  Head:  Normocephalic and atraumatic.  Right Ear: External ear normal.  Left Ear: External ear normal.  Nose: Nose normal.  Mouth/Throat: Oropharynx is clear and moist.  Eyes: Conjunctivae and EOM are normal. Pupils are equal, round, and reactive to light.  Neck: Normal range of motion. Neck supple. No tracheal deviation present. No thyromegaly present.  Cardiovascular: Normal rate, regular rhythm, normal heart sounds and intact distal pulses.  Exam reveals no gallop and no friction rub.   No murmur heard. Pulmonary/Chest: Effort normal and breath sounds normal. No respiratory distress. She has no wheezes. She has no rales. She exhibits no tenderness.  Abdominal: Soft. Bowel sounds are normal. She exhibits no distension and no mass. There is no tenderness. There is no rebound and  no guarding.  Musculoskeletal: Normal range of motion.  Lymphadenopathy:    She has no cervical adenopathy.  Neurological: She is alert and oriented to person, place, and time. She has normal reflexes. No cranial nerve deficit. Coordination normal.  Skin: Skin is warm and dry.  Psychiatric: She has a normal mood and affect. Her behavior is normal. Judgment and thought content normal.      Assessment & Plan:

## 2012-10-21 NOTE — Patient Instructions (Addendum)
   Thank you for choosing [facility] for your health care needs.  I will request your medical records from previous provider.  Please remember to activate your MyChart account. The activation code is located at the end of this form.

## 2012-10-31 ENCOUNTER — Other Ambulatory Visit: Payer: Self-pay | Admitting: *Deleted

## 2012-10-31 MED ORDER — OMEPRAZOLE 20 MG PO CPDR: 20.0000 mg | DELAYED_RELEASE_CAPSULE | Freq: Every day | ORAL | Status: DC

## 2013-02-23 ENCOUNTER — Encounter: Payer: BC Managed Care – PPO | Admitting: Adult Health

## 2013-02-27 ENCOUNTER — Other Ambulatory Visit: Payer: Self-pay | Admitting: Adult Health

## 2013-02-27 NOTE — Telephone Encounter (Signed)
Last Rx was printed at her OV, 30 day supply with 3 refills. So, daily use would make it time for refill. She is also coming in for appt 03/02/13

## 2013-02-27 NOTE — Telephone Encounter (Signed)
Becky, When I looked at her chart it shows that I sent this to her mail order pharmacy. Can you look into this for me? Thanks

## 2013-02-27 NOTE — Telephone Encounter (Signed)
Okay to refill? Pt coming in on 03/02/13 (rescheduled CPE)

## 2013-02-28 ENCOUNTER — Other Ambulatory Visit: Payer: Self-pay | Admitting: Adult Health

## 2013-02-28 NOTE — Telephone Encounter (Signed)
See my previous note

## 2013-02-28 NOTE — Telephone Encounter (Signed)
Pt called checking on her rx for ambein  Please advise pt Pt is completely out of meds She stated [facility] [address] [facility] information and they have not heard from you.  Pt has appointment thursday  [phone] pt phone

## 2013-03-01 NOTE — Telephone Encounter (Signed)
Rx called to pharmacy

## 2013-03-02 ENCOUNTER — Encounter: Payer: Self-pay | Admitting: Adult Health

## 2013-03-02 ENCOUNTER — Encounter (INDEPENDENT_AMBULATORY_CARE_PROVIDER_SITE_OTHER): Payer: Self-pay

## 2013-03-02 ENCOUNTER — Ambulatory Visit (INDEPENDENT_AMBULATORY_CARE_PROVIDER_SITE_OTHER): Payer: BC Managed Care – PPO | Admitting: Adult Health

## 2013-03-02 VITALS — BP 102/66 | HR 84 | Resp 12 | Ht 64.25 in | Wt 153.5 lb

## 2013-03-02 DIAGNOSIS — B360 Pityriasis versicolor: Secondary | ICD-10-CM | POA: Insufficient documentation

## 2013-03-02 DIAGNOSIS — Z0000 Encounter for general adult medical examination without abnormal findings: Secondary | ICD-10-CM | POA: Insufficient documentation

## 2013-03-02 NOTE — Progress Notes (Signed)
Subjective:    Patient ID: Margaret Silva, female    DOB: 06-Mar-1980, 33 y.o.   MRN: [id number]  HPI  Margaret Silva presents to clinic for her yearly physical. Overall, she is feeling well. She brings with her a form to become a foster parent. She is requesting that I fill this out for her. She is also leaving for [place] on a mission trip tomorrow afternoon. She is excited and looking forward to that.    Past Medical History  Diagnosis Date  . History of colonoscopy 11/04 and April 06  . History of laparoscopy 12/04 and 12/07  . Interstitial cystitis 01/03/2004    hydrodistilation  . 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 History of laparoscopy 01/29/2006    Dr. Georga Bora Endometriosis  . Endometriosis   . Asthma   . History of chicken pox   . Diverticulitis     h/o  . GERD (gastroesophageal reflux disease)   . Hx of migraines   . Jaundice     h/o  . Hx: UTI (urinary tract infection)      Past Surgical History  Procedure Laterality Date  . Diagnostic laparoscopy  02/16/2003  . Sigmoidoscopy  05/26/10  . Cystoscopy with hydrodistension and biopsy       Family History  Problem Relation Age of Onset  . Heart attack Paternal Grandfather   . Cancer Paternal Grandfather     Pancreatic/ Bladder Cancer/prostate  . Arthritis Paternal Grandfather   . Heart disease Paternal Grandfather   . Cancer Paternal Grandmother     Liver cancer/lung cancer  . Diabetes Paternal Grandmother   . Diabetes Maternal Grandmother   . Arthritis Maternal Grandmother   . Kidney disease Maternal Grandmother   . Heart attack Maternal Grandfather   . Heart disease Maternal Grandfather   . Hypertension Maternal Grandfather   . Mental illness Maternal Grandfather   . Hypertension Father   . Hyperlipidemia Father      History   Social History  . Marital Status: Divorced    Spouse Name: N/A    Number of Children: N/A  . Years of Education: N/A    Occupational History  . Not on file.   Social History Main Topics  . Smoking status: Never Smoker   . Smokeless tobacco: Never Used  . Alcohol Use: Yes     Comment: occasionally  . Drug Use: No  . Sexual Activity: Yes    Birth Control/ Protection: Implant   Other Topics Concern  . Not on file   Social History Narrative  . No narrative on file     Current Outpatient Prescriptions on File Prior to Visit  Medication Sig Dispense Refill  . divalproex (DEPAKOTE) 500 MG DR tablet Take 1 tablet (500 mg total) by mouth daily.  90 tablet  4  . DULoxetine (CYMBALTA) 30 MG capsule Take 3 capsules (90 mg total) by mouth daily.  270 capsule  4  . ibuprofen (ADVIL,MOTRIN) 800 MG tablet Take 1 tablet by mouth as needed.       Marland Kitchen levonorgestrel (MIRENA) 20 MCG/24HR IUD 1 each by Intrauterine route once.        Marland Kitchen omeprazole (PRILOSEC) 20 MG capsule Take 1 capsule (20 mg total) by mouth daily.  90 capsule  3  . SUMAtriptan (IMITREX) 100 MG tablet Take 100 mg by mouth every 2 (two) hours as needed for migraine.      . valACYclovir (VALTREX)  500 MG tablet Take 1 tablet (500 mg total) by mouth daily.  90 tablet  4  . zolpidem (AMBIEN) 10 MG tablet TAKE 1 TABLET BY MOUTH EVERY DAY  30 tablet  3   No current facility-administered medications on file prior to visit.    Review of Systems  Constitutional: Negative.   HENT: Negative.   Eyes: Negative.   Respiratory: Negative.   Cardiovascular: Negative.   Gastrointestinal: Negative.   Endocrine: Negative.   Genitourinary: Negative.   Musculoskeletal: Negative.   Skin: Negative.   Allergic/Immunologic: Negative.   Neurological: Negative.   Hematological: Negative.   Psychiatric/Behavioral: Negative.        Objective:   Physical Exam  Constitutional: She is oriented to person, place, and time. She appears well-developed and well-nourished. No distress.  HENT:  Head: Normocephalic and atraumatic.  Right Ear: External ear normal.  Left  Ear: External ear normal.  Nose: Nose normal.  Mouth/Throat: Oropharynx is clear and moist.  Eyes: Conjunctivae and EOM are normal. Pupils are equal, round, and reactive to light.  Neck: Normal range of motion. Neck supple. No tracheal deviation present. No thyromegaly present.  Cardiovascular: Normal rate, regular rhythm, normal heart sounds and intact distal pulses.  Exam reveals no gallop and no friction rub.   No murmur heard. Pulmonary/Chest: Effort normal and breath sounds normal. No respiratory distress. She has no wheezes. She has no rales. Right breast exhibits no inverted nipple, no mass, no nipple discharge, no skin change and no tenderness. Left breast exhibits no inverted nipple, no mass, no nipple discharge, no skin change and no tenderness. Breasts are symmetrical.  Abdominal: Soft. Bowel sounds are normal. She exhibits no distension and no mass. There is no tenderness. There is no rebound and no guarding.  Genitourinary:  Breast exam normal. No palpable masses. No lymphadenopathy.  Musculoskeletal: Normal range of motion. She exhibits no edema and no tenderness.  Lymphadenopathy:    She has no cervical adenopathy.  Neurological: She is alert and oriented to person, place, and time. She has normal reflexes. No cranial nerve deficit. Coordination normal.  Skin: Skin is warm and dry. Rash noted.     Tinea versicolor on upper portion of back  Psychiatric: She has a normal mood and affect. Her behavior is normal. Judgment and thought content normal.          Assessment & Plan:

## 2013-03-02 NOTE — Assessment & Plan Note (Signed)
She is followed by Derm who prescribed a shampoo for her skin. She has not been using. Encouraged her to apply. Appears to be spreading

## 2013-03-02 NOTE — Progress Notes (Signed)
Pre visit review using our clinic review tool, if applicable. No additional management support is needed unless otherwise documented below in the visit note. 

## 2013-03-02 NOTE — Assessment & Plan Note (Signed)
Normal physical exam including breast exam. Exam excluded pelvic/PAP. Labs: cbc w/diff, cmet, lipids

## 2013-03-02 NOTE — Patient Instructions (Signed)
  Please return for your fasting labs in the morning.  We will notify you of results once they are available.  Please call with any question or concerns.

## 2013-03-03 ENCOUNTER — Other Ambulatory Visit (INDEPENDENT_AMBULATORY_CARE_PROVIDER_SITE_OTHER): Payer: BC Managed Care – PPO

## 2013-03-03 DIAGNOSIS — Z0000 Encounter for general adult medical examination without abnormal findings: Secondary | ICD-10-CM

## 2013-03-03 LAB — CBC WITH DIFFERENTIAL/PLATELET
Basophils Absolute: 0 10*3/uL (ref 0.0–0.1)
Basophils Relative: 0.3 % (ref 0.0–3.0)
Eosinophils Absolute: 0.1 10*3/uL (ref 0.0–0.7)
Eosinophils Relative: 1.4 % (ref 0.0–5.0)
HEMATOCRIT: 39.6 % (ref 36.0–46.0)
Hemoglobin: 13.4 g/dL (ref 12.0–15.0)
LYMPHS ABS: 1.9 10*3/uL (ref 0.7–4.0)
LYMPHS PCT: 30.3 % (ref 12.0–46.0)
MCHC: 33.7 g/dL (ref 30.0–36.0)
MCV: 95.9 fl (ref 78.0–100.0)
MONOS PCT: 9.2 % (ref 3.0–12.0)
Monocytes Absolute: 0.6 10*3/uL (ref 0.1–1.0)
NEUTROS ABS: 3.7 10*3/uL (ref 1.4–7.7)
NEUTROS PCT: 58.8 % (ref 43.0–77.0)
PLATELETS: 232 10*3/uL (ref 150.0–400.0)
RBC: 4.13 Mil/uL (ref 3.87–5.11)
RDW: 13.5 % (ref 11.5–14.6)
WBC: 6.4 10*3/uL (ref 4.5–10.5)

## 2013-03-03 LAB — COMPREHENSIVE METABOLIC PANEL
ALK PHOS: 59 U/L (ref 39–117)
ALT: 11 U/L (ref 0–35)
AST: 13 U/L (ref 0–37)
Albumin: 4 g/dL (ref 3.5–5.2)
BILIRUBIN TOTAL: 0.5 mg/dL (ref 0.3–1.2)
BUN: 18 mg/dL (ref 6–23)
CHLORIDE: 106 meq/L (ref 96–112)
CO2: 26 mEq/L (ref 19–32)
CREATININE: 0.9 mg/dL (ref 0.4–1.2)
Calcium: 9.3 mg/dL (ref 8.4–10.5)
GFR: 75.19 mL/min (ref >60.00)
GLUCOSE: 85 mg/dL (ref 70–99)
Potassium: 4.8 mEq/L (ref 3.5–5.1)
Sodium: 141 mEq/L (ref 135–145)
TOTAL PROTEIN: 6.7 g/dL (ref 6.0–8.3)

## 2013-03-03 LAB — LIPID PANEL
Cholesterol: 169 mg/dL (ref 0–200)
HDL: 38.4 mg/dL — ABNORMAL LOW (ref >39.00)
LDL Cholesterol: 116 mg/dL — ABNORMAL HIGH (ref 0–99)
TRIGLYCERIDES: 73 mg/dL (ref 0.0–149.0)
Total CHOL/HDL Ratio: 4
VLDL: 14.6 mg/dL (ref 0.0–40.0)

## 2013-03-06 ENCOUNTER — Encounter: Payer: Self-pay | Admitting: *Deleted

## 2013-08-03 ENCOUNTER — Encounter: Payer: Self-pay | Admitting: Adult Health

## 2013-08-03 ENCOUNTER — Ambulatory Visit (INDEPENDENT_AMBULATORY_CARE_PROVIDER_SITE_OTHER): Payer: BC Managed Care – PPO | Admitting: Adult Health

## 2013-08-03 VITALS — BP 108/66 | HR 106 | Resp 16 | Ht 64.25 in | Wt 154.2 lb

## 2013-08-03 DIAGNOSIS — Z79899 Other long term (current) drug therapy: Secondary | ICD-10-CM

## 2013-08-03 MED ORDER — DIVALPROEX SODIUM 500 MG PO DR TAB: 500.0000 mg | DELAYED_RELEASE_TABLET | Freq: Every day | ORAL | Status: DC

## 2013-08-03 MED ORDER — OMEPRAZOLE 20 MG PO CPDR: 20.0000 mg | DELAYED_RELEASE_CAPSULE | Freq: Every day | ORAL | Status: DC

## 2013-08-03 MED ORDER — VALACYCLOVIR HCL 500 MG PO TABS: 500.0000 mg | ORAL_TABLET | Freq: Every day | ORAL | Status: DC

## 2013-08-03 MED ORDER — ZOLPIDEM TARTRATE 10 MG PO TABS: ORAL_TABLET | ORAL | Status: DC

## 2013-08-03 NOTE — Progress Notes (Signed)
Pre visit review using our clinic review tool, if applicable. No additional management support is needed unless otherwise documented below in the visit note. 

## 2013-08-03 NOTE — Progress Notes (Signed)
Patient ID: Margaret Silva, female   DOB: Nov 22, 1980, 32 y.o.   MRN: [id number]   Subjective:    Patient ID: Margaret Silva, female    DOB: 29-Jun-1980, 33 y.o.   MRN: [id number]  HPI  Patient is a pleasant 33 year old female who presents to clinic for medication management and refills. She is changing and plan and and is concerned about their 60 day waiting period and that she may run out of medications. She is feeling well. She is very excited about her new job.  Past Medical History  Diagnosis Date  . History of colonoscopy 11/04 and April 06  . History of laparoscopy 12/04 and 12/07  . Interstitial cystitis 01/03/2004    hydrodistilation  . 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 History of laparoscopy 01/29/2006    Dr. Georga Bora Endometriosis  . Endometriosis   . Asthma   . History of chicken pox   . Diverticulitis     h/o  . GERD (gastroesophageal reflux disease)   . Hx of migraines   . Jaundice     h/o  . Hx: UTI (urinary tract infection)     Current Outpatient Prescriptions on File Prior to Visit  Medication Sig Dispense Refill  . DULoxetine (CYMBALTA) 30 MG capsule Take 3 capsules (90 mg total) by mouth daily.  270 capsule  4  . fluticasone (FLONASE) 50 MCG/ACT nasal spray Place 2 sprays into both nostrils daily.       Marland Kitchen levonorgestrel (MIRENA) 20 MCG/24HR IUD 1 each by Intrauterine route once.        . ciclesonide (ALVESCO) 80 MCG/ACT inhaler Inhale 1 puff into the lungs 2 (two) times daily.      Marland Kitchen ibuprofen (ADVIL,MOTRIN) 800 MG tablet Take 1 tablet by mouth as needed.       . methocarbamol (ROBAXIN) 500 MG tablet 1/2 - 1 tab at 7 AM, and 2 PM prn      . SUMAtriptan (IMITREX) 100 MG tablet Take 100 mg by mouth every 2 (two) hours as needed for migraine.      . VENTOLIN HFA 108 (90 BASE) MCG/ACT inhaler        No current facility-administered medications on file prior to visit.     Review of Systems  Constitutional:  Negative.   Eyes: Negative.   Respiratory: Negative.   Cardiovascular: Negative.   Gastrointestinal: Negative.   Endocrine: Negative.   Genitourinary: Negative.   Musculoskeletal: Negative.   Skin: Negative.   Allergic/Immunologic: Negative.   Neurological: Positive for headaches (hx of migraine - controlled with depakote).  Hematological: Negative.   Psychiatric/Behavioral: Positive for sleep disturbance (controlled with medication).       Objective:  BP 108/66  Pulse 106  Resp 16  Ht 5' 4.25" (1.632 m)  Wt 154 lb 4 oz (69.967 kg)  BMI 26.27 kg/m2  SpO2 95%   Physical Exam  Constitutional: She is oriented to person, place, and time. She appears well-developed and well-nourished. No distress.  Cardiovascular: Normal rate and regular rhythm.   Pulmonary/Chest: Effort normal. No respiratory distress.  Musculoskeletal: Normal range of motion.  Neurological: She is alert and oriented to person, place, and time.  Skin: Skin is warm and dry.  Psychiatric: She has a normal mood and affect. Her behavior is normal. Judgment and thought content normal.      Assessment & Plan:   1. Medication management Reviewed medications requiring refills with pt. She takes  depakote low dose for migraine prevention and this has been working for her. She takes [place] every night since age 56. Discussed about this medication being habit forming. She will not be able to sleep unless she has this medication. Does not want to make any changes at this time. Take 5 mg at bedtime. Refills sent to her mail order pharmacy except for [facility] which was given to her to fill locally. RTC in 6 month or sooner if necessary.

## 2013-10-03 ENCOUNTER — Other Ambulatory Visit: Payer: Self-pay | Admitting: Adult Health

## 2013-10-27 ENCOUNTER — Other Ambulatory Visit: Payer: Self-pay | Admitting: Adult Health

## 2014-01-26 ENCOUNTER — Ambulatory Visit (INDEPENDENT_AMBULATORY_CARE_PROVIDER_SITE_OTHER): Payer: BC Managed Care – PPO | Admitting: Nurse Practitioner

## 2014-01-26 ENCOUNTER — Encounter (INDEPENDENT_AMBULATORY_CARE_PROVIDER_SITE_OTHER): Payer: Self-pay

## 2014-01-26 ENCOUNTER — Encounter: Payer: Self-pay | Admitting: Nurse Practitioner

## 2014-01-26 ENCOUNTER — Telehealth: Payer: Self-pay | Admitting: *Deleted

## 2014-01-26 VITALS — BP 113/78 | HR 76 | Temp 98.0°F | Resp 12 | Ht 64.25 in | Wt 177.8 lb

## 2014-01-26 DIAGNOSIS — K589 Irritable bowel syndrome without diarrhea: Secondary | ICD-10-CM

## 2014-01-26 DIAGNOSIS — R0982 Postnasal drip: Secondary | ICD-10-CM

## 2014-01-26 DIAGNOSIS — J019 Acute sinusitis, unspecified: Secondary | ICD-10-CM

## 2014-01-26 DIAGNOSIS — J309 Allergic rhinitis, unspecified: Secondary | ICD-10-CM | POA: Insufficient documentation

## 2014-01-26 MED ORDER — ZOLPIDEM TARTRATE 10 MG PO TABS: ORAL_TABLET | ORAL | Status: DC

## 2014-01-26 MED ORDER — DULOXETINE HCL 30 MG PO CPEP: 90.0000 mg | ORAL_CAPSULE | Freq: Every day | ORAL | Status: DC

## 2014-01-26 MED ORDER — METHYLPREDNISOLONE 4 MG PO KIT: PACK | ORAL | Status: DC

## 2014-01-26 NOTE — Assessment & Plan Note (Addendum)
Pt on cymbalta for GI symptoms she reports. Sees [facility] C[facility] (unsure of spelling) for GI and is taking 3 capsules daily for relief. Stable. Refill given.

## 2014-01-26 NOTE — Assessment & Plan Note (Signed)
Pt already tried on her own Cipro without relief. Discussed with pt that it is probably viral in nature and will continue to improve over the next few weeks. Pt instructed use Claritin, Benadryl once at night, Sudafed PE as needed for congestion, Delsym for cough, and saline nasal rinse. FU prn if symptoms worsen, fail to improve, or fever 101 or greater.   *Pt called later because of no order for abx. It was explained once again that because you tried and abx and it was not successful it is probably viral in nature. Prednisone taper called in for symptom relief.

## 2014-01-26 NOTE — Progress Notes (Signed)
Pre visit review using our clinic review tool, if applicable. No additional management support is needed unless otherwise documented below in the visit note. 

## 2014-01-26 NOTE — Patient Instructions (Signed)
Your cough may be coming from reflux, allergies, or post nasal drip (PND).  PND and allergies can be treated with benadryl,  Allegra, Zyrtec and claritin. Benadryl is the most effective for drying you up but it is also the most sedating,  So try taking it at night and use one of the others in the daytime  Add Sudafed PE as needed for congestion  "DM" stands for dextromethorphan which is a cough suppressant.  The best/strongest available OTC cough suppressant is Delsym.  If the cough does not improve  With these measures,  Add the prevacid back in,  Consider using simply Saline to flush your sinuses twice daily when you have congestion to prevent sinus infections   Follow up as needed.

## 2014-01-26 NOTE — Progress Notes (Signed)
Subjective:    Patient ID: Margaret Silva Silva, female    DOB: 01-Apr-1980, 33 y.o.   MRN: [id number]  HPI  Margaret Silva Silva is a 33 yo female with a CC of nasal congestion and medication refill.   1) Onset: 13 days ago. Nasal Congestion, facial pain, frontal > maxillary, headache, green nasal discharge. Pt tried flonase- not helpful, had past script for cipro twice daily for 7 days and took this without medical consult and without relief.   2) Sleep problems- Ambien still working well for pt   3) Cymbalta- for IBS. [facility]   3 capsules daily working for pt  Review of Systems  Constitutional: Positive for fatigue. Negative for fever, chills and diaphoresis.  HENT: Positive for postnasal drip, rhinorrhea, sinus pressure, sneezing and tinnitus. Negative for ear discharge, ear pain, sore throat, trouble swallowing and voice change.        Tinnitus bilaterally  Eyes: Negative for pain, redness and visual disturbance.  Respiratory: Positive for cough. Negative for chest tightness and wheezing.   Cardiovascular: Negative for chest pain, palpitations and leg swelling.  Gastrointestinal: Negative for nausea, vomiting, diarrhea and constipation.  Musculoskeletal: Positive for myalgias. Negative for neck pain and neck stiffness.  Skin: Negative for pallor.  Neurological: Positive for headaches.  Hematological: Positive for adenopathy.  Psychiatric/Behavioral: Negative for suicidal ideas.   Past Medical History  Diagnosis Date  . History of colonoscopy 11/04 and April 06  . History of laparoscopy 12/04 and 12/07  . Interstitial cystitis 01/03/2004    hydrodistilation  . 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 History of laparoscopy 01/29/2006    Dr. Georga Bora Endometriosis  . Endometriosis   . Asthma   . History of chicken pox   . Diverticulitis     h/o  . GERD (gastroesophageal reflux disease)   . Hx of migraines   . Jaundice       h/o  . Hx: UTI (urinary tract infection)     History   Social History  . Marital Status: Divorced    Spouse Name: N/A    Number of Children: N/A  . Years of Education: N/A   Occupational History  . Not on file.   Social History Main Topics  . Smoking status: Never Smoker   . Smokeless tobacco: Never Used  . Alcohol Use: Yes     Comment: occasionally  . Drug Use: No  . Sexual Activity: Yes    Birth Control/ Protection: Implant   Other Topics Concern  . Not on file   Social History Narrative  . No narrative on file    Past Surgical History  Procedure Laterality Date  . Diagnostic laparoscopy  02/16/2003  . Sigmoidoscopy  05/26/10  . Cystoscopy with hydrodistension and biopsy      Family History  Problem Relation Age of Onset  . Heart attack Paternal Grandfather   . Cancer Paternal Grandfather     Pancreatic/ Bladder Cancer/prostate  . Arthritis Paternal Grandfather   . Heart disease Paternal Grandfather   . Cancer Paternal Grandmother     Liver cancer/lung cancer  . Diabetes Paternal Grandmother   . Diabetes Maternal Grandmother   . Arthritis Maternal Grandmother   . Kidney disease Maternal Grandmother   . Heart attack Maternal Grandfather   . Heart disease Maternal Grandfather   . Hypertension Maternal Grandfather   . Mental illness Maternal Grandfather   . Hypertension  Father   . Hyperlipidemia Father     Allergies  Allergen Reactions  . Trazodone And Nefazodone Other (See Comments)    irritability    Current Outpatient Prescriptions on File Prior to Visit  Medication Sig Dispense Refill  . Biotin 5000 MCG CAPS Take 1 capsule by mouth daily.    . cholecalciferol (VITAMIN D) 1000 UNITS tablet Take 1,000 Units by mouth daily.    . ciclesonide (ALVESCO) 80 MCG/ACT inhaler Inhale 1 puff into the lungs 2 (two) times daily.    . divalproex (DEPAKOTE) 500 MG DR tablet Take 1 tablet (500 mg total) by mouth daily. 90 tablet 3  . DULoxetine (CYMBALTA) 30  MG capsule TAKE 3 CAPSULES BY MOUTH EVERY DAY 270 capsule 0  . fluticasone (FLONASE) 50 MCG/ACT nasal spray Place 2 sprays into both nostrils daily.     Marland Kitchen ibuprofen (ADVIL,MOTRIN) 800 MG tablet Take 1 tablet by mouth as needed.     Marland Kitchen levonorgestrel (MIRENA) 20 MCG/24HR IUD 1 each by Intrauterine route once.      . loratadine (CLARITIN) 10 MG tablet Take 10 mg by mouth daily.    . methocarbamol (ROBAXIN) 500 MG tablet 1/2 - 1 tab at 7 AM, and 2 PM prn    . omeprazole (PRILOSEC) 20 MG capsule TAKE 1 CAPSULE DAILY 90 capsule 3  . SUMAtriptan (IMITREX) 100 MG tablet Take 100 mg by mouth every 2 (two) hours as needed for migraine.    . valACYclovir (VALTREX) 500 MG tablet Take 1 tablet (500 mg total) by mouth daily. 90 tablet 3  . VENTOLIN HFA 108 (90 BASE) MCG/ACT inhaler     . zolpidem (AMBIEN) 10 MG tablet Take 1/2 tablet at bedtime at needed. 15 tablet 5   No current facility-administered medications on file prior to visit.        Objective:   Physical Exam  Constitutional: She is oriented to person, place, and time. She appears well-developed and well-nourished. No distress.  HENT:  Head: Normocephalic and atraumatic.  Right Ear: External ear normal.  Left Ear: External ear normal.  Mouth/Throat: No oropharyngeal exudate.  TMs clear bilat  Eyes: Conjunctivae and EOM are normal. Pupils are equal, round, and reactive to light. Right eye exhibits no discharge. Left eye exhibits no discharge. No scleral icterus.  Cardiovascular: Normal rate and regular rhythm.   Pulmonary/Chest: Effort normal and breath sounds normal. No respiratory distress. She has no wheezes. She has no rales. She exhibits no tenderness.  Neurological: She is alert and oriented to person, place, and time.  Skin: Skin is warm and dry. No rash noted. She is not diaphoretic.  Psychiatric: She has a normal mood and affect. Her behavior is normal. Judgment and thought content normal.    BP 113/78 mmHg  Pulse 76   Temp(Src) 98 F (36.7 C)  Resp 12  Ht 5' 4.25" (1.632 m)  Wt 177 lb 12.8 oz (80.65 kg)  BMI 30.28 kg/m2  SpO2 99%     Assessment & Plan:  Declined flu shot

## 2014-01-26 NOTE — Telephone Encounter (Signed)
Pt called back after her appointment, requesting an antibiotic. She states she has already done everything on the AVS and she wants and antibiotic. [facility] notified. Returned pt call and left message, per Morey Hummingbird, antibiotic not indicated since pt had already taken Cipro. Advised she has sent in a Rx for prednisone for her symptoms. Advised to call back with any worsening or persistent symptoms.

## 2014-03-09 LAB — BASIC METABOLIC PANEL
BUN: 21 mg/dL (ref 4–21)
CREATININE: 0.9 mg/dL (ref 0.5–1.1)
GLUCOSE: 85 mg/dL
POTASSIUM: 5.2 mmol/L (ref 3.4–5.3)
Sodium: 140 mmol/L (ref 137–147)

## 2014-03-09 LAB — CBC AND DIFFERENTIAL
HEMATOCRIT: 42 % (ref 36–46)
Hemoglobin: 14.4 g/dL (ref 12.0–16.0)
Neutrophils Absolute: 4 /uL
Platelets: 374 10*3/uL (ref 150–399)
WBC: 7 10*3/mL

## 2014-03-09 LAB — LIPID PANEL
CHOLESTEROL: 210 mg/dL — AB (ref 0–200)
HDL: 49 mg/dL (ref 35–70)
LDL Cholesterol: 133 mg/dL
Triglycerides: 142 mg/dL (ref 40–160)

## 2014-03-09 LAB — TSH: TSH: 3.73 u[IU]/mL (ref 0.41–5.90)

## 2014-03-09 LAB — HEPATIC FUNCTION PANEL
ALT: 14 U/L (ref 7–35)
AST: 17 U/L (ref 13–35)
Alkaline Phosphatase: 77 U/L (ref 25–125)
BILIRUBIN, TOTAL: 0.2 mg/dL

## 2014-03-12 ENCOUNTER — Other Ambulatory Visit: Payer: Self-pay

## 2014-04-20 ENCOUNTER — Other Ambulatory Visit: Payer: Self-pay | Admitting: Nurse Practitioner

## 2014-08-06 ENCOUNTER — Other Ambulatory Visit: Payer: Self-pay | Admitting: Nurse Practitioner

## 2014-08-07 NOTE — Telephone Encounter (Signed)
Last Ov 12.11.15.  Please advise refill

## 2014-08-08 ENCOUNTER — Encounter: Payer: Self-pay | Admitting: Family Medicine

## 2014-08-08 ENCOUNTER — Ambulatory Visit (INDEPENDENT_AMBULATORY_CARE_PROVIDER_SITE_OTHER): Payer: BLUE CROSS/BLUE SHIELD | Admitting: Family Medicine

## 2014-08-08 VITALS — BP 112/76 | HR 104 | Temp 98.1°F | Resp 18 | Ht 64.0 in | Wt 178.6 lb

## 2014-08-08 DIAGNOSIS — K589 Irritable bowel syndrome without diarrhea: Secondary | ICD-10-CM | POA: Diagnosis not present

## 2014-08-08 DIAGNOSIS — F3340 Major depressive disorder, recurrent, in remission, unspecified: Secondary | ICD-10-CM

## 2014-08-08 DIAGNOSIS — F5104 Psychophysiologic insomnia: Secondary | ICD-10-CM | POA: Insufficient documentation

## 2014-08-08 DIAGNOSIS — L731 Pseudofolliculitis barbae: Secondary | ICD-10-CM

## 2014-08-08 DIAGNOSIS — G4700 Insomnia, unspecified: Secondary | ICD-10-CM

## 2014-08-08 DIAGNOSIS — F325 Major depressive disorder, single episode, in full remission: Secondary | ICD-10-CM | POA: Insufficient documentation

## 2014-08-08 MED ORDER — ZOLPIDEM TARTRATE 10 MG PO TABS: 10.0000 mg | ORAL_TABLET | Freq: Every day | ORAL | Status: DC

## 2014-08-08 MED ORDER — DULOXETINE HCL 30 MG PO CPEP: 90.0000 mg | ORAL_CAPSULE | Freq: Every day | ORAL | Status: DC

## 2014-08-08 NOTE — Progress Notes (Signed)
Name: Margaret Silva   MRN: [id number]    DOB: 1980/10/15   Date:08/08/2014       Progress Note  Subjective  Chief Complaint  Chief Complaint  Patient presents with  . Establish Care  . Medication Refill    HPI  Has some lesions in genital area not sure if it is in grown hair or active herpes lesions. Using Valtrex which helps but lesions do return. Located inner buttock left sided. Needs refills of Duloxetine 30mg  three a day, and ambien 10mg  one a day. Previously diagnosed with IBS, Interstitial Cystitis, Depression, Fibromyalgia, Insomnia, Endometriosis, POTS, Migraines.   Patient Active Problem List   Diagnosis Date Noted  . Major depressive disorder in remission 08/08/2014  . Chronic insomnia 08/08/2014  . Allergic rhinitis with postnasal drip 01/26/2014  . Tinea versicolor 03/02/2013  . POTS (postural orthostatic tachycardia syndrome) 12/03/2010  . Endometriosis 12/03/2010  . Fibromyalgia 12/03/2010  . Restless leg syndrome 12/03/2010  . 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 IBS (irritable bowel syndrome) 12/03/2010  . Migraine with aura and without status migrainosus, not intractable 12/03/2010    History  Substance Use Topics  . Smoking status: Never Smoker   . Smokeless tobacco: Never Used  . Alcohol Use: 0.0 oz/week    0 Standard drinks or equivalent per week     Comment: occasionally     Current outpatient prescriptions:  .  Biotin 5000 MCG CAPS, Take 1 capsule by mouth daily., Disp: , Rfl:  .  cholecalciferol (VITAMIN D) 1000 UNITS tablet, Take 1,000 Units by mouth daily., Disp: , Rfl:  .  ciclesonide (ALVESCO) 80 MCG/ACT inhaler, Inhale 1 puff into the lungs 2 (two) times daily., Disp: , Rfl:  .  divalproex (DEPAKOTE) 500 MG DR tablet, Take 1 tablet (500 mg total) by mouth daily., Disp: 90 tablet, Rfl: 3 .  DULoxetine (CYMBALTA) 30 MG capsule, Take 3 capsules (90 mg total) by mouth daily., Disp: 270 capsule, Rfl: 2 .   fluticasone (FLONASE) 50 MCG/ACT nasal spray, Place 2 sprays into both nostrils daily. , Disp: , Rfl:  .  ibuprofen (ADVIL,MOTRIN) 800 MG tablet, Take 1 tablet by mouth as needed. , Disp: , Rfl:  .  levonorgestrel (MIRENA) 20 MCG/24HR IUD, 1 each by Intrauterine route once.  , Disp: , Rfl:  .  loratadine (CLARITIN) 10 MG tablet, Take 10 mg by mouth daily., Disp: , Rfl:  .  methocarbamol (ROBAXIN) 500 MG tablet, 1/2 - 1 tab at 7 AM, and 2 PM prn, Disp: , Rfl:  .  methylPREDNISolone (MEDROL DOSEPAK) 4 MG tablet, follow package directions, Disp: 21 tablet, Rfl: 0 .  Multiple Vitamins-Minerals (MULTIVITAMIN ADULT) TABS, Take by mouth., Disp: , Rfl:  .  mupirocin ointment (BACTROBAN) 2 %, APPLY A SMALL AMOUNT TO THE AFFECTED AREA BY TOPICAL ROUTE 3 TIMES PER DAY FOR 7 DAYS, Disp: , Rfl: 0 .  omeprazole (PRILOSEC) 20 MG capsule, TAKE 1 CAPSULE DAILY, Disp: 90 capsule, Rfl: 3 .  SUMAtriptan (IMITREX) 100 MG tablet, Take 100 mg by mouth every 2 (two) hours as needed for migraine., Disp: , Rfl:  .  valACYclovir (VALTREX) 500 MG tablet, Take 1 tablet (500 mg total) by mouth daily., Disp: 90 tablet, Rfl: 3 .  VENTOLIN HFA 108 (90 BASE) MCG/ACT inhaler, , Disp: , Rfl:  .  zolpidem (AMBIEN) 10 MG tablet, Take 1 tablet (10 mg total) by mouth at bedtime. Take 1/2 tablet at bedtime at needed., Disp: 30 tablet,  Rfl: 5  Past Surgical History  Procedure Laterality Date  . Diagnostic laparoscopy  02/16/2003  . Sigmoidoscopy  05/26/10  . Cystoscopy with hydrodistension and biopsy      Family History  Problem Relation Age of Onset  . Heart attack Paternal Grandfather   . Cancer Paternal Grandfather     Pancreatic/ Bladder Cancer/prostate  . Arthritis Paternal Grandfather   . Heart disease Paternal Grandfather   . Cancer Paternal Grandmother     Liver cancer/lung cancer  . Diabetes Paternal Grandmother   . Diabetes Maternal Grandmother   . Arthritis Maternal Grandmother   . Kidney disease Maternal  Grandmother   . Heart attack Maternal Grandfather   . Heart disease Maternal Grandfather   . Hypertension Maternal Grandfather   . Mental illness Maternal Grandfather   . Hypertension Father   . Hyperlipidemia Father     Allergies  Allergen Reactions  . Trazodone And Nefazodone Other (See Comments)    irritability     Review of Systems  Ten systems reviewed and is negative except as mentioned in HPI.   Objective  BP 112/76 mmHg  Pulse 104  Temp(Src) 98.1 F (36.7 C) (Oral)  Resp 18  Ht 5\' 4"  (1.626 m)  Wt 178 lb 9.6 oz (81.012 kg)  BMI 30.64 kg/m2  SpO2 96%  Physical Exam  Constitutional: Patient appears well-developed and well-nourished. In no distress.  Neck: Normal range of motion. Neck supple. No JVD present. No thyromegaly present.  Cardiovascular: Normal rate, regular rhythm and normal heart sounds.  No murmur heard.  Pulmonary/Chest: Effort normal and breath sounds normal. No respiratory distress. Musculoskeletal: Normal range of motion bilateral UE and LE, no joint effusions. Peripheral vascular: Bilateral LE no edema. External genitalia: well healed blemishes left inner buttock w/o ulcerations. Skin: Skin is warm and dry. No rash noted. No erythema.  Psychiatric: Patient has a normal mood and affect. Behavior is normal in office today. Judgment and thought content normal in office today.   Assessment & Plan  1. IBS (irritable bowel syndrome) Stable  2. Major depressive disorder, recurrent, in remission Refilled  - DULoxetine (CYMBALTA) 30 MG capsule; Take 3 capsules (90 mg total) by mouth daily.  Dispense: 270 capsule; Refill: 2  3. Chronic insomnia Refilled Zolpidem 10mg  she is using 1/2 to 1 tablet at bedtime almost every night. I have expressed to the patient that chronic use of this medication can result in ineffective response in the long run.  - zolpidem (AMBIEN) 10 MG tablet; Take 1 tablet (10 mg total) by mouth at bedtime.  Dispense: 30  tablet; Refill: 5  4. Ingrown hair Reassured patient, lesions do not resemble herpes lesions. May use topical triple abx on the area, avoid shaving if symptoms reoccur.

## 2014-08-14 ENCOUNTER — Other Ambulatory Visit: Payer: Self-pay

## 2014-09-04 ENCOUNTER — Ambulatory Visit (INDEPENDENT_AMBULATORY_CARE_PROVIDER_SITE_OTHER): Payer: BLUE CROSS/BLUE SHIELD | Admitting: Family Medicine

## 2014-09-04 ENCOUNTER — Encounter: Payer: Self-pay | Admitting: Family Medicine

## 2014-09-04 VITALS — BP 110/78 | HR 96 | Temp 98.7°F | Resp 18 | Ht 64.0 in | Wt 177.5 lb

## 2014-09-04 DIAGNOSIS — Z0000 Encounter for general adult medical examination without abnormal findings: Secondary | ICD-10-CM

## 2014-09-04 DIAGNOSIS — M270 Developmental disorders of jaws: Secondary | ICD-10-CM

## 2014-09-04 DIAGNOSIS — Z124 Encounter for screening for malignant neoplasm of cervix: Secondary | ICD-10-CM

## 2014-09-04 DIAGNOSIS — Z975 Presence of (intrauterine) contraceptive device: Secondary | ICD-10-CM

## 2014-09-04 NOTE — Progress Notes (Signed)
Name: Margaret Silva   MRN: [id number]    DOB: 08-10-1980   Date:09/04/2014       Progress Note  Subjective  Chief Complaint  Chief Complaint  Patient presents with  . Annual Exam    HPI  Patient is here today for a Complete Female Physical Exam:  The patient has has no unusual complaints. Overall feels healthy. Diet is well balanced. In general does exercise regularly such as swimming with her niece. Sees dentist regularly and addresses vision concerns with ophthalmologist if applicable. In regards to sexual activity the patient is currently sexually active. Currently is not concerned about exposure to any STDs.   Menstrual history is regular periods every 30 days and she has Mirena IUD in place.   Past Medical History  Diagnosis Date  . History of colonoscopy 11/04 and April 06  . History of laparoscopy 12/04 and 12/07  . Interstitial cystitis 01/03/2004    hydrodistilation  . 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 History of laparoscopy 01/29/2006    Dr. Georga Bora Endometriosis  . Endometriosis   . Asthma   . History of chicken pox   . Diverticulitis     h/o  . GERD (gastroesophageal reflux disease)   . Hx of migraines   . Jaundice     h/o  . Hx: UTI (urinary tract infection)     Past Surgical History  Procedure Laterality Date  . Diagnostic laparoscopy  02/16/2003  . Sigmoidoscopy  05/26/10  . Cystoscopy with hydrodistension and biopsy      Family History  Problem Relation Age of Onset  . Heart attack Paternal Grandfather   . Cancer Paternal Grandfather     Pancreatic/ Bladder Cancer/prostate  . Arthritis Paternal Grandfather   . Heart disease Paternal Grandfather   . Cancer Paternal Grandmother     Liver cancer/lung cancer  . Diabetes Paternal Grandmother   . Diabetes Maternal Grandmother   . Arthritis Maternal Grandmother   . Kidney disease Maternal Grandmother   . Heart attack Maternal Grandfather   . Heart  disease Maternal Grandfather   . Hypertension Maternal Grandfather   . Mental illness Maternal Grandfather   . Hypertension Father   . Hyperlipidemia Father     History   Social History  . Marital Status: Divorced    Spouse Name: N/A  . Number of Children: N/A  . Years of Education: N/A   Occupational History  . Not on file.   Social History Main Topics  . Smoking status: Never Smoker   . Smokeless tobacco: Never Used  . Alcohol Use: 0.0 oz/week    0 Standard drinks or equivalent per week     Comment: occasionally  . Drug Use: No  . Sexual Activity:    Partners: Male    Birth Control/ Protection: Implant   Other Topics Concern  . Not on file   Social History Narrative     Current outpatient prescriptions:  .  Biotin 5000 MCG CAPS, Take 1 capsule by mouth daily., Disp: , Rfl:  .  cholecalciferol (VITAMIN D) 1000 UNITS tablet, Take 1,000 Units by mouth daily., Disp: , Rfl:  .  ciclesonide (ALVESCO) 80 MCG/ACT inhaler, Inhale 1 puff into the lungs 2 (two) times daily., Disp: , Rfl:  .  divalproex (DEPAKOTE) 500 MG DR tablet, Take 1 tablet (500 mg total) by mouth daily., Disp: 90 tablet, Rfl: 3 .  DULoxetine (CYMBALTA) 30 MG capsule,  Take 3 capsules (90 mg total) by mouth daily., Disp: 270 capsule, Rfl: 2 .  fluticasone (FLONASE) 50 MCG/ACT nasal spray, Place 2 sprays into both nostrils daily. , Disp: , Rfl:  .  ibuprofen (ADVIL,MOTRIN) 800 MG tablet, Take 1 tablet by mouth as needed. , Disp: , Rfl:  .  levonorgestrel (MIRENA) 20 MCG/24HR IUD, 1 each by Intrauterine route once.  , Disp: , Rfl:  .  loratadine (CLARITIN) 10 MG tablet, Take 10 mg by mouth daily., Disp: , Rfl:  .  methocarbamol (ROBAXIN) 500 MG tablet, 1/2 - 1 tab at 7 AM, and 2 PM prn, Disp: , Rfl:  .  methylPREDNISolone (MEDROL DOSEPAK) 4 MG tablet, follow package directions, Disp: 21 tablet, Rfl: 0 .  Multiple Vitamins-Minerals (MULTIVITAMIN ADULT) TABS, Take by mouth., Disp: , Rfl:  .  mupirocin ointment  (BACTROBAN) 2 %, APPLY A SMALL AMOUNT TO THE AFFECTED AREA BY TOPICAL ROUTE 3 TIMES PER DAY FOR 7 DAYS, Disp: , Rfl: 0 .  omeprazole (PRILOSEC) 20 MG capsule, TAKE 1 CAPSULE DAILY, Disp: 90 capsule, Rfl: 3 .  SUMAtriptan (IMITREX) 100 MG tablet, Take 100 mg by mouth every 2 (two) hours as needed for migraine., Disp: , Rfl:  .  valACYclovir (VALTREX) 500 MG tablet, Take 1 tablet (500 mg total) by mouth daily., Disp: 90 tablet, Rfl: 3 .  VENTOLIN HFA 108 (90 BASE) MCG/ACT inhaler, , Disp: , Rfl:  .  zolpidem (AMBIEN) 10 MG tablet, Take 1 tablet (10 mg total) by mouth at bedtime., Disp: 30 tablet, Rfl: 5  Allergies  Allergen Reactions  . Coffee Bean Extract  [Coffea Arabica] Nausea Only  . Trazodone And Nefazodone Other (See Comments)    irritability    ROS  CONSTITUTIONAL: No significant weight changes, fever, chills, weakness or fatigue.  HEENT:  - Eyes: No visual changes.  - Ears: No auditory changes. No pain.  - Nose: No sneezing, congestion, runny nose. - Throat: No sore throat. No changes in swallowing. SKIN: No rash or itching.  CARDIOVASCULAR: No chest pain, chest pressure or chest discomfort. No palpitations or edema.  RESPIRATORY: No shortness of breath, cough or sputum.  GASTROINTESTINAL: No anorexia, nausea, vomiting. No changes in bowel habits. No abdominal pain or blood.  GENITOURINARY: No dysuria. No frequency. No discharge.  NEUROLOGICAL: No headache, dizziness, syncope, paralysis, ataxia, numbness or tingling in the extremities. No memory changes. No change in bowel or bladder control.  MUSCULOSKELETAL: No joint pain. No muscle pain. HEMATOLOGIC: No anemia, bleeding or bruising.  LYMPHATICS: No enlarged lymph nodes.  PSYCHIATRIC: No change in mood. No change in sleep pattern.  ENDOCRINOLOGIC: No reports of sweating, cold or heat intolerance. No polyuria or polydipsia.   Objective  Filed Vitals:   09/04/14 1434  BP: 110/78  Pulse: 96  Temp: 98.7 F (37.1 C)   TempSrc: Oral  Resp: 18  Height: 5\' 4"  (1.626 m)  Weight: 177 lb 8 oz (80.513 kg)  SpO2: 95%   Body mass index is 30.45 kg/(m^2).  Depression screen PHQ 2/9 08/08/2014  Decreased Interest 0  Down, Depressed, Hopeless 0  PHQ - 2 Score 0    Physical Exam  Constitutional: Patient appears well-developed and well-nourished. In no distress.  HEENT:  - Head: Normocephalic and atraumatic.  - Ears: Bilateral TMs gray, no erythema or effusion - Nose: Nasal mucosa moist - Mouth/Throat: Oropharynx is clear and moist. No tonsillar hypertrophy or erythema. No post nasal drainage. Uniform torus palatinus midline. - Eyes: Conjunctivae  clear, EOM movements normal. PERRLA. No scleral icterus.  Neck: Normal range of motion. Neck supple. No JVD present. No thyromegaly present.  Cardiovascular: Normal rate, regular rhythm and normal heart sounds.  No murmur heard.  Pulmonary/Chest: Effort normal and breath sounds normal. No respiratory distress. Abdominal: Soft. Bowel sounds are normal, no distension. There is no tenderness. no masses BREAST: Bilateral breast exam normal with no masses, skin changes or nipple discharge FEMALE GENITALIA:  External genitalia normal External urethra normal Vaginal vault normal without discharge or lesions Cervix normal without discharge or lesions, IUD strings visualized Bimanual exam normal without masses RECTAL: no rectal masses or hemorrhoids Musculoskeletal: Normal range of motion bilateral UE and LE, no joint effusions. Peripheral vascular: Bilateral LE no edema. Neurological: CN II-XII grossly intact with no focal deficits. Alert and oriented to person, place, and time. Coordination, balance, strength, speech and gait are normal.  Skin: Skin is warm and dry. No rash noted. No erythema.  Psychiatric: Patient has a normal mood and affect. Behavior is normal in office today. Judgment and thought content normal in office today.   Assessment & Plan  1. Annual  physical exam Normal female exam, stable chronic medical issues.  2. IUD contraception IUD check normal.  3. Cervical cancer screening PAP done.  - Pap IG w/ reflex to HPV when ASC-U  4. Torus palatinus Normal variant for patient.

## 2014-09-07 LAB — PAP IG W/ RFLX HPV ASCU: PAP Smear Comment: 0

## 2014-09-14 ENCOUNTER — Other Ambulatory Visit: Payer: Self-pay | Admitting: Family Medicine

## 2014-09-14 DIAGNOSIS — B009 Herpesviral infection, unspecified: Secondary | ICD-10-CM

## 2014-10-24 ENCOUNTER — Other Ambulatory Visit: Payer: Self-pay | Admitting: Family Medicine

## 2014-10-24 DIAGNOSIS — G43009 Migraine without aura, not intractable, without status migrainosus: Secondary | ICD-10-CM

## 2014-11-21 ENCOUNTER — Other Ambulatory Visit: Payer: Self-pay | Admitting: Family Medicine

## 2015-02-28 ENCOUNTER — Other Ambulatory Visit: Payer: Self-pay | Admitting: Family Medicine

## 2015-03-25 ENCOUNTER — Encounter: Payer: Self-pay | Admitting: Family Medicine

## 2015-03-25 ENCOUNTER — Ambulatory Visit (INDEPENDENT_AMBULATORY_CARE_PROVIDER_SITE_OTHER): Payer: BLUE CROSS/BLUE SHIELD | Admitting: Family Medicine

## 2015-03-25 VITALS — BP 110/74 | HR 72 | Temp 98.1°F | Resp 16 | Wt 172.0 lb

## 2015-03-25 DIAGNOSIS — M7062 Trochanteric bursitis, left hip: Secondary | ICD-10-CM

## 2015-03-25 MED ORDER — LIDOCAINE HCL (PF) 1 % IJ SOLN
2.0000 mL | Freq: Once | INTRAMUSCULAR | Status: AC
  Administered 2015-03-25: 2 mL via INTRADERMAL

## 2015-03-25 MED ORDER — TRIAMCINOLONE ACETONIDE 40 MG/ML IJ SUSP
40.0000 mg | Freq: Once | INTRAMUSCULAR | Status: AC
Start: 2015-03-25 — End: 2015-03-25
  Administered 2015-03-25: 40 mg via INTRAMUSCULAR

## 2015-03-25 NOTE — Progress Notes (Signed)
Name: Margaret Silva   MRN: [id number]    DOB: 1980-05-30   Date:03/25/2015       Progress Note  Subjective  Chief Complaint  Chief Complaint  Patient presents with  . Leg Pain    patient has had left leg pain that radiates to her hip fot the past couple of weeks    HPI  Trochanteric bursa: she was on a medical mission's trip January 14th, 2017 and developed acute left outer hip pain a few days after her arrival. She describes the pain as dull aching with episodes of sharp pain with pressure and positional changes. No fever or chills. No redness on the site. She aches all over from [facility] but this pain is different.  Aggravated by walking or laying on left side.   Patient Active Problem List   Diagnosis Date Noted  . IUD contraception 09/04/2014  . 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 Chronic insomnia 08/08/2014  . Allergic rhinitis with postnasal drip 01/26/2014  . Tinea versicolor 03/02/2013  . POTS (postural orthostatic tachycardia syndrome) 12/03/2010  . Endometriosis 12/03/2010  . Fibromyalgia 12/03/2010  . Restless leg syndrome 12/03/2010  . 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 IBS (irritable bowel syndrome) 12/03/2010  . Migraine with aura and without status migrainosus, not intractable 12/03/2010    Past Surgical History  Procedure Laterality Date  . Diagnostic laparoscopy  02/16/2003  . Sigmoidoscopy  05/26/10  . Cystoscopy with hydrodistension and biopsy      Family History  Problem Relation Age of Onset  . Heart attack Paternal Grandfather   . Cancer Paternal Grandfather     Pancreatic/ Bladder Cancer/prostate  . Arthritis Paternal Grandfather   . Heart disease Paternal Grandfather   . Cancer Paternal Grandmother     Liver cancer/lung cancer  . Diabetes Paternal Grandmother   . Diabetes Maternal Grandmother   . Arthritis Maternal Grandmother   . Kidney disease Maternal  Grandmother   . Heart attack Maternal Grandfather   . Heart disease Maternal Grandfather   . Hypertension Maternal Grandfather   . Mental illness Maternal Grandfather   . Hypertension Father   . Hyperlipidemia Father     Social History   Social History  . Marital Status: Divorced    Spouse Name: N/A  . Number of Children: N/A  . Years of Education: N/A   Occupational History  . Not on file.   Social History Main Topics  . Smoking status: Never Smoker   . Smokeless tobacco: Never Used  . Alcohol Use: 0.0 oz/week    0 Standard drinks or equivalent per week     Comment: occasionally  . Drug Use: No  . Sexual Activity:    Partners: Male    Birth Control/ Protection: Implant   Other Topics Concern  . Not on file   Social History Narrative     Current outpatient prescriptions:  .  Biotin 5000 MCG CAPS, Take 1 capsule by mouth daily., Disp: , Rfl:  .  cholecalciferol (VITAMIN D) 1000 UNITS tablet, Take 1,000 Units by mouth daily., Disp: , Rfl:  .  divalproex (DEPAKOTE) 500 MG DR tablet, TAKE 1 TABLET (500 MG TOTAL) BY MOUTH DAILY., Disp: 90 tablet, Rfl: 2 .  DULoxetine (CYMBALTA) 30 MG capsule, Take 3 capsules (90 mg total) by mouth daily., Disp: 270 capsule, Rfl: 2 .  fluticasone (FLONASE) 50 MCG/ACT nasal spray, Place 2 sprays into both nostrils daily. ,  Disp: , Rfl:  .  ibuprofen (ADVIL,MOTRIN) 800 MG tablet, Take 1 tablet by mouth as needed. , Disp: , Rfl:  .  levonorgestrel (MIRENA) 20 MCG/24HR IUD, 1 each by Intrauterine route once.  , Disp: , Rfl:  .  loratadine (CLARITIN) 10 MG tablet, Take 10 mg by mouth daily., Disp: , Rfl:  .  methocarbamol (ROBAXIN) 500 MG tablet, 1/2 - 1 tab at 7 AM, and 2 PM prn, Disp: , Rfl:  .  Multiple Vitamins-Minerals (MULTIVITAMIN ADULT) TABS, Take by mouth., Disp: , Rfl:  .  omeprazole (PRILOSEC) 20 MG capsule, TAKE 1 CAPSULE BY MOUTH DAILY, Disp: 90 capsule, Rfl: 3 .  SUMAtriptan (IMITREX) 100 MG tablet, Take 100 mg by mouth every 2  (two) hours as needed for migraine., Disp: , Rfl:  .  valACYclovir (VALTREX) 500 MG tablet, TAKE 1 TABLET (500 MG TOTAL) BY MOUTH DAILY., Disp: 90 tablet, Rfl: 2 .  VENTOLIN HFA 108 (90 BASE) MCG/ACT inhaler, , Disp: , Rfl:  .  zolpidem (AMBIEN) 10 MG tablet, TAKE 1 TABLET BY MOUTH AT BEDTIME, Disp: 30 tablet, Rfl: 5  Allergies  Allergen Reactions  . Coffee Bean Extract  [Coffea Arabica] Nausea Only  . Trazodone And Nefazodone Other (See Comments)    irritability     ROS  Ten systems reviewed and is negative except as mentioned in HPI   Objective  Filed Vitals:   03/25/15 1525  BP: 110/74  Pulse: 72  Temp: 98.1 F (36.7 C)  TempSrc: Oral  Resp: 16  Weight: 172 lb (78.019 kg)  SpO2: 97%    Body mass index is 29.51 kg/(m^2).  Physical Exam  Constitutional: Patient appears well-developed and well-nourished. Obese  No distress.  HEENT: head atraumatic, normocephalic, pupils equal and reactive to light, , neck supple, throat within normal limits Cardiovascular: Normal rate, regular rhythm and normal heart sounds.  No murmur heard. No BLE edema. Pulmonary/Chest: Effort normal and breath sounds normal. No respiratory distress. Abdominal: Soft.  There is no tenderness. Psychiatric: Patient has a normal mood and affect. behavior is normal. Judgment and thought content normal. Muscular Skeletal: pain during palpation of left out hip, normal rom of both hips, she has positive trigger points.   PHQ2/9: Depression screen [facility] 2/9 03/25/2015 08/08/2014  Decreased Interest 0 0  Down, Depressed, Hopeless 0 0  PHQ - 2 Score 0 0     Fall Risk: Fall Risk  03/25/2015 08/08/2014  Falls in the past year? No Yes  Number falls in past yr: - 2 or more  Injury with Fall? - No  Follow up - Education provided;Falls prevention discussed     Functional Status Survey: Is the patient deaf or have difficulty hearing?: No Does the patient have difficulty seeing, even when wearing  glasses/contacts?: No Does the patient have difficulty concentrating, remembering, or making decisions?: No Does the patient have difficulty walking or climbing stairs?: Yes (due to leg pain) Does the patient have difficulty dressing or bathing?: No Does the patient have difficulty doing errands alone such as visiting a doctor's office or shopping?: No    Assessment & Plan   1. Trochanteric bursitis of left hip  - triamcinolone acetonide (KENALOG-40) injection 40 mg; Inject 1 mL (40 mg total) into the muscle once. - lidocaine (PF) (XYLOCAINE) 1 % injection 2 mL; Inject 2 mLs into the skin once.  Patient signed consent form  Area prepped with betadine and alcohol  Left/right trochanteric bursa injected with 1 % lidocaine  no epi and 40mg  /ml   No complications  Patient tolerated procedure well  Dressing applied

## 2015-04-01 ENCOUNTER — Telehealth: Payer: Self-pay | Admitting: Family Medicine

## 2015-04-01 ENCOUNTER — Other Ambulatory Visit: Payer: Self-pay | Admitting: Family Medicine

## 2015-04-01 DIAGNOSIS — M25552 Pain in left hip: Secondary | ICD-10-CM

## 2015-04-01 NOTE — Telephone Encounter (Signed)
Patient states she does not need a referral due to seeing [place] already on April 25, 2015 but will call to see if she could move it up. You can put the referral as done since patient already sees a Orthopedic and will call to schedule appointment due to leg pain. Thanks

## 2015-04-01 NOTE — Telephone Encounter (Signed)
DR Nadine Counts PATIENT: saw dr Ancil Boozer on last Monday with leg pain and was given a shot, as of today patient is still not able to walk having a lot of leg pain. Would like to know what is another option. Should she schedule another appointment or should she get a referral to a specialist.

## 2015-04-01 NOTE — Telephone Encounter (Signed)
Sending a referral to Ortho

## 2015-04-02 NOTE — Addendum Note (Signed)
Addended by: Steele Sizer F on: 04/02/2015 03:23 PM   Modules accepted: Level of Service

## 2015-05-02 ENCOUNTER — Other Ambulatory Visit: Payer: Self-pay | Admitting: Family Medicine

## 2015-06-21 ENCOUNTER — Other Ambulatory Visit: Payer: Self-pay

## 2015-06-21 DIAGNOSIS — B009 Herpesviral infection, unspecified: Secondary | ICD-10-CM

## 2015-06-21 MED ORDER — VALACYCLOVIR HCL 500 MG PO TABS: 500.0000 mg | ORAL_TABLET | Freq: Every day | ORAL | Status: DC

## 2015-06-21 NOTE — Telephone Encounter (Signed)
Rx approved; last seen Feb 2017

## 2015-07-16 ENCOUNTER — Other Ambulatory Visit: Payer: Self-pay

## 2015-07-16 MED ORDER — DULOXETINE HCL 30 MG PO CPEP: 90.0000 mg | ORAL_CAPSULE | Freq: Every day | ORAL | Status: DC

## 2015-07-16 NOTE — Telephone Encounter (Signed)
Fibromyalgia in problem list; last pulse and BP okay; Rx approved

## 2015-07-24 ENCOUNTER — Telehealth: Payer: Self-pay

## 2015-07-24 NOTE — Telephone Encounter (Signed)
She needs an appt for this; this is a controlled substance

## 2015-07-25 ENCOUNTER — Other Ambulatory Visit: Payer: Self-pay

## 2015-07-25 DIAGNOSIS — G43009 Migraine without aura, not intractable, without status migrainosus: Secondary | ICD-10-CM

## 2015-07-25 DIAGNOSIS — E785 Hyperlipidemia, unspecified: Secondary | ICD-10-CM | POA: Insufficient documentation

## 2015-07-25 DIAGNOSIS — Z5181 Encounter for therapeutic drug level monitoring: Secondary | ICD-10-CM

## 2015-07-25 MED ORDER — DIVALPROEX SODIUM 500 MG PO DR TAB: 500.0000 mg | DELAYED_RELEASE_TABLET | Freq: Every day | ORAL | Status: DC

## 2015-07-25 NOTE — Telephone Encounter (Signed)
Pt has an appt 07/31/15

## 2015-07-25 NOTE — Telephone Encounter (Signed)
I approved limited amount of depakote; no liver enzymes since Jan 2016, no depakote level that I could find ----------------------- Roselyn Reef, Please ask patient to have labs drawn in the next few days prior to appointment so we'll have them to discuss at her visit; thank you

## 2015-07-26 ENCOUNTER — Other Ambulatory Visit: Payer: Self-pay

## 2015-07-26 DIAGNOSIS — Z5181 Encounter for therapeutic drug level monitoring: Secondary | ICD-10-CM

## 2015-07-26 DIAGNOSIS — E785 Hyperlipidemia, unspecified: Secondary | ICD-10-CM

## 2015-07-26 NOTE — Telephone Encounter (Signed)
I called patient as requested Patient says she is really frustrated that the previous doctor left and she can't get this medicine refilled She is upset with the front staff and doesn't understand why she's been waiting since Monday to get this refilled I explained that this is a controlled substance and the decision to not refill was my decision, not anything to do with the front staff so this is not on them at all, this was solely my decision to not refill her medicine She said not to worry about it and hung up on me

## 2015-07-26 NOTE — Telephone Encounter (Signed)
I'm denying the ambien; controlled substance, needs an appt to discuss, plan to substitute with something else

## 2015-07-26 NOTE — Telephone Encounter (Signed)
I informed patient that no medication would be given to her until she is at least seem.  I did offer to put patient on a cancellation for today, but she informed me she was the only one at her office today.  Patient would like to speak with you even though she was informed that she needed to be seen.

## 2015-07-26 NOTE — Telephone Encounter (Signed)
Patient wants know if she could get enough ambien to last until her appointment on Wed. 07/31/15?  Patient's call back number is [phone].

## 2015-07-26 NOTE — Telephone Encounter (Signed)
Pt notified and is not happy!

## 2015-07-26 NOTE — Telephone Encounter (Signed)
Pt also needs ambien refilled

## 2015-07-26 NOTE — Telephone Encounter (Signed)
No, I'm sorry

## 2015-07-31 ENCOUNTER — Encounter: Payer: Self-pay | Admitting: Family Medicine

## 2015-07-31 ENCOUNTER — Ambulatory Visit (INDEPENDENT_AMBULATORY_CARE_PROVIDER_SITE_OTHER): Payer: BLUE CROSS/BLUE SHIELD | Admitting: Family Medicine

## 2015-07-31 VITALS — BP 112/72 | HR 96 | Temp 98.8°F | Resp 14 | Wt 179.0 lb

## 2015-07-31 DIAGNOSIS — G43109 Migraine with aura, not intractable, without status migrainosus: Secondary | ICD-10-CM | POA: Diagnosis not present

## 2015-07-31 DIAGNOSIS — F5104 Psychophysiologic insomnia: Secondary | ICD-10-CM

## 2015-07-31 DIAGNOSIS — Z79899 Other long term (current) drug therapy: Secondary | ICD-10-CM | POA: Diagnosis not present

## 2015-07-31 DIAGNOSIS — G2581 Restless legs syndrome: Secondary | ICD-10-CM | POA: Diagnosis not present

## 2015-07-31 DIAGNOSIS — G4700 Insomnia, unspecified: Secondary | ICD-10-CM | POA: Diagnosis not present

## 2015-07-31 MED ORDER — ZOLPIDEM TARTRATE 5 MG PO TABS: 5.0000 mg | ORAL_TABLET | Freq: Every evening | ORAL | Status: DC | PRN

## 2015-07-31 NOTE — Assessment & Plan Note (Addendum)
Long discussion about risks of Margaret Silva; she believes benefits outweigh risks; advised [facility], list of providers given; she ws given the controlled substance contract, but apparently did not agree with it and would not submit to urine drug testing and left the office per my [place]; I had the Rx for Ambien already printed and signed, but without submitting to UDS, she will not be able to received controlled substances; she is certainly welcome to reach out to her neurologist for treatment of insomnia

## 2015-07-31 NOTE — Assessment & Plan Note (Addendum)
Discussed risk of controlled substances including possible unintentional overdose, especially if mixed with alcohol or other pills; typical speech given including illegal to share, even out of the goodness of patient's heart, always keep in the original bottle, safeguard medicine, do NOT mix with alcohol, other pain pills, "nerve" or anxiety pills, or sleeping pills; I am not obligated to approve of early refill or give new prescription if medicine is lost, stolen, or destroyed even with a police report, etc.; patient agrees with plan initially, and I left the room to ask my CMA to get her UDS collected; the controlled substance contract was apparently NOT signed by patient, as she did not agree with it and refused to have urine drug screen testing and left the office upset

## 2015-07-31 NOTE — Assessment & Plan Note (Addendum)
Patient to see neurologist

## 2015-07-31 NOTE — Progress Notes (Signed)
BP 112/72 mmHg  Pulse 96  Temp(Src) 98.8 F (37.1 C) (Oral)  Resp 14  Wt 179 lb (81.194 kg)  SpO2 96%   Subjective:    Patient ID: Margaret Silva, female    DOB: 03/30/1980, 35 y.o.   MRN: [id number]  HPI: VENNESSA Silva is a 35 y.o. female  Chief Complaint  Patient presents with  . Medication Refill   Patient is new to me; her previous provider left this practice; see phone note prior to our visit dated 07/24/15  She has insomnia and says she has been on something since age 28 for sleep; took benadryl since mid-teens; melatonin; sister does too; sister had EDS and fibro and POTS; sister on time-released ambien Patient has seen a sleep specialist, did not have OSA; does have restless leg syndrome She has tried several different things; most recent medicine is [place]; she tried trazodone but it made her ill She wakes up every hour if she doesn't have something Still wakes up on the [place], but apparently not as often She has been on wellbutrin did not help No evidence of cooking or driving or anything in her sleep (no somnambulism) Constant problem She also says she has ADHD; she moves 24/7 Father has an issue with too much iron Consider in future requip She complains of migraine headaches;   Depression screen [facility] 2/9 07/31/2015 03/25/2015 08/08/2014  Decreased Interest 0 0 0  Down, Depressed, Hopeless 0 0 0  PHQ - 2 Score 0 0 0   Relevant past medical, surgical, family and social history reviewed Past Medical History  Diagnosis Date  . History of colonoscopy 11/04 and April 06  . History of laparoscopy 12/04 and 12/07  . Interstitial cystitis 01/03/2004    hydrodistilation  . 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 History of laparoscopy 01/29/2006    Dr. Georga Bora Endometriosis  . Endometriosis   . Asthma   . History of chicken pox   . Diverticulitis     h/o  . GERD (gastroesophageal reflux disease)   . Hx of  migraines   . Jaundice     h/o  . Hx: UTI (urinary tract infection)   . Plantar fasciitis, bilateral   . Bursitis of left hip   . Headache    Past Surgical History  Procedure Laterality Date  . Diagnostic laparoscopy  02/16/2003  . Sigmoidoscopy  05/26/10  . Cystoscopy with hydrodistension and biopsy     Family History  Problem Relation Age of Onset  . Heart attack Paternal Grandfather   . Cancer Paternal Grandfather     Pancreatic/ Bladder Cancer/prostate  . Arthritis Paternal Grandfather   . Heart disease Paternal Grandfather   . Cancer Paternal Grandmother     Liver cancer/lung cancer  . Diabetes Paternal Grandmother   . Diabetes Maternal Grandmother   . Arthritis Maternal Grandmother   . Kidney disease Maternal Grandmother   . Heart attack Maternal Grandfather   . Heart disease Maternal Grandfather   . Hypertension Maternal Grandfather   . Mental illness Maternal Grandfather   . Hypertension Father   . Hyperlipidemia Father   . Fibromyalgia Mother   . Migraines Mother   . Ehlers-Danlos syndrome Sister   . Fibromyalgia Sister    Social History  Substance Use Topics  . Smoking status: Never Smoker   . Smokeless tobacco: Never Used  . Alcohol Use: 0.0 oz/week    0 Standard  drinks or equivalent per week     Comment: occasionally    Interim medical history since last visit reviewed. Allergies and medications reviewed  Review of Systems Per HPI unless specifically indicated above     Objective:    BP 112/72 mmHg  Pulse 96  Temp(Src) 98.8 F (37.1 C) (Oral)  Resp 14  Wt 179 lb (81.194 kg)  SpO2 96%  Wt Readings from Last 3 Encounters:  08/12/15 178 lb (80.74 kg)  07/31/15 179 lb (81.194 kg)  03/25/15 172 lb (78.019 kg)    Physical Exam  Constitutional: She appears well-developed and well-nourished. No distress.  HENT:  Head: Normocephalic and atraumatic.  Eyes: EOM are normal. No scleral icterus.  Neck: No thyromegaly present.  Cardiovascular:  Normal rate.   Pulmonary/Chest: Effort normal.  Abdominal: She exhibits no distension.  Musculoskeletal: She exhibits no edema.  Neurological: She is alert. She exhibits normal muscle tone.  Skin: Skin is warm and dry. She is not diaphoretic. No pallor.  Psychiatric: She has a normal mood and affect. Her behavior is normal.  She became irritated and left the office without submitting a urine for drug screening, and therefore did not receive the Rx for ambien       Assessment & Plan:   Problem List Items Addressed This Visit      Cardiovascular and Mediastinum   Migraine with aura and without status migrainosus, not intractable    Patient to see neurologist        Other   Restless leg syndrome (Chronic)    Could consider requip; patient says she already had a sleep study      Controlled substance agreement signed    Discussed risk of controlled substances including possible unintentional overdose, especially if mixed with alcohol or other pills; typical speech given including illegal to share, even out of the goodness of patient's heart, always keep in the original bottle, safeguard medicine, do NOT mix with alcohol, other pain pills, "nerve" or anxiety pills, or sleeping pills; I am not obligated to approve of early refill or give new prescription if medicine is lost, stolen, or destroyed even with a police report, etc.; patient agrees with plan initially, and I left the room to ask my CMA to get her UDS collected; the controlled substance contract was apparently NOT signed by patient, as she did not agree with it and refused to have urine drug screen testing and left the office upset      Chronic insomnia - Primary    Long discussion about risks of Lorrin Mais; she believes benefits outweigh risks; advised CBT-I, list of providers given; she ws given the controlled substance contract, but apparently did not agree with it and would not submit to urine drug testing and left the office per my  [place]; I had the Rx for Ambien already printed and signed, but without submitting to UDS, she will not be able to received controlled substances; she is certainly welcome to reach out to her neurologist for treatment of insomnia         Follow up plan: Return in about 3 months (around 10/31/2015) for follow-up.  An after-visit summary was printed and given to the patient at [facility].  Please see the patient instructions which may contain other information and recommendations beyond what is mentioned above in the assessment and plan.  Meds ordered this encounter  Medications  . DISCONTD: zolpidem (AMBIEN) 5 MG tablet    Sig: Take 1 tablet (5 mg total)  by mouth at bedtime as needed for sleep.    Dispense:  30 tablet    Refill:  2

## 2015-07-31 NOTE — Patient Instructions (Addendum)
Check with your father to see if he has hereditary hemochromatosis  Check out cognitive behavioral therapy for insomnia [email or web address]-[email or web address]-[email or web address]  I encourage you to start working with a therapist to help you sleep I encourage you to take a "drug holiday" once a week to help the medicine work better on the other nights  Return in 3 months for next refill and follow-up  Insomnia Insomnia is a sleep disorder that makes it difficult to fall asleep or to stay asleep. Insomnia can cause tiredness (fatigue), low energy, difficulty concentrating, mood swings, and poor performance at work or school.  There are three different ways to classify insomnia:  Difficulty falling asleep.  Difficulty staying asleep.  Waking up too early in the morning. Any type of insomnia can be long-term (chronic) or short-term (acute). Both are common. Short-term insomnia usually lasts for three months or less. Chronic insomnia occurs at least three times a week for longer than three months. CAUSES  Insomnia may be caused by another condition, situation, or substance, such as:  Anxiety.  Certain medicines.  Gastroesophageal reflux disease (GERD) or other gastrointestinal conditions.  Asthma or other breathing conditions.  Restless legs syndrome, sleep apnea, or other sleep disorders.  Chronic pain.  Menopause. This may include hot flashes.  Stroke.  Abuse of alcohol, tobacco, or illegal drugs.  Depression.  Caffeine.   Neurological disorders, such as Alzheimer disease.  An overactive thyroid (hyperthyroidism). The cause of insomnia may not be known. RISK FACTORS Risk factors for insomnia include:  Gender. Women are more commonly affected than men.  Age. Insomnia is more common as you get older.  Stress. This may involve your professional or personal life.  Income. Insomnia is more common in people with lower income.  Lack of exercise.    Irregular work schedule or night shifts.  Traveling between different time zones. SIGNS AND SYMPTOMS If you have insomnia, trouble falling asleep or trouble staying asleep is the main symptom. This may lead to other symptoms, such as:  Feeling fatigued.  Feeling nervous about going to sleep.  Not feeling rested in the morning.  Having trouble concentrating.  Feeling irritable, anxious, or depressed. TREATMENT  Treatment for insomnia depends on the cause. If your insomnia is caused by an underlying condition, treatment will focus on addressing the condition. Treatment may also include:   Medicines to help you sleep.  Counseling or therapy.  Lifestyle adjustments. HOME CARE INSTRUCTIONS   Take medicines only as directed by your health care provider.  Keep regular sleeping and waking hours. Avoid naps.  Keep a sleep diary to help you and your health care provider figure out what could be causing your insomnia. Include:   When you sleep.  When you wake up during the night.  How well you sleep.   How rested you feel the next day.  Any side effects of medicines you are taking.  What you eat and drink.   Make your bedroom a comfortable place where it is easy to fall asleep:  Put up shades or special blackout curtains to block light from outside.  Use a white noise machine to block noise.  Keep the temperature cool.   Exercise regularly as directed by your health care provider. Avoid exercising right before bedtime.  Use relaxation techniques to manage stress. Ask your health care provider to suggest some techniques that may work well for you. These may include:  Breathing exercises.  Routines to release muscle tension.  Visualizing peaceful scenes.  Cut  back on alcohol, caffeinated beverages, and cigarettes, especially close to bedtime. These can disrupt your sleep.  Do not overeat or eat spicy foods right before bedtime. This can lead to digestive  discomfort that can make it hard for you to sleep.  Limit screen use before bedtime. This includes:  Watching TV.  Using your smartphone, tablet, and computer.  Stick to a routine. This can help you fall asleep faster. Try to do a quiet activity, brush your teeth, and go to bed at the same time each night.  Get out of bed if you are still awake after 15 minutes of trying to sleep. Keep the lights down, but try reading or doing a quiet activity. When you feel sleepy, go back to bed.  Make sure that you drive carefully. Avoid driving if you feel very sleepy.  Keep all follow-up appointments as directed by your health care provider. This is important. SEEK MEDICAL CARE IF:   You are tired throughout the day or have trouble in your daily routine due to sleepiness.  You continue to have sleep problems or your sleep problems get worse. SEEK IMMEDIATE MEDICAL CARE IF:   You have serious thoughts about hurting yourself or someone else.   This information is not intended to replace advice given to you by your health care provider. Make sure you discuss any questions you have with your health care provider.   Document Released: 01/31/2000 Document Revised: 10/24/2014 Document Reviewed: 11/03/2013 Elsevier Interactive Patient Education [employer].

## 2015-08-12 ENCOUNTER — Encounter: Payer: Self-pay | Admitting: Neurology

## 2015-08-12 ENCOUNTER — Ambulatory Visit (INDEPENDENT_AMBULATORY_CARE_PROVIDER_SITE_OTHER): Payer: BLUE CROSS/BLUE SHIELD | Admitting: Neurology

## 2015-08-12 VITALS — BP 115/78 | HR 79 | Ht 64.0 in | Wt 178.0 lb

## 2015-08-12 DIAGNOSIS — G43709 Chronic migraine without aura, not intractable, without status migrainosus: Secondary | ICD-10-CM

## 2015-08-12 DIAGNOSIS — G43909 Migraine, unspecified, not intractable, without status migrainosus: Secondary | ICD-10-CM

## 2015-08-12 DIAGNOSIS — G4489 Other headache syndrome: Secondary | ICD-10-CM | POA: Diagnosis not present

## 2015-08-12 MED ORDER — ZOLMITRIPTAN 5 MG NA SOLN: 1.0000 | NASAL | Status: DC | PRN

## 2015-08-12 MED ORDER — KETOROLAC TROMETHAMINE 60 MG/2ML IM SOLN
60.0000 mg | Freq: Once | INTRAMUSCULAR | Status: AC
  Administered 2015-08-12: 60 mg via INTRAMUSCULAR

## 2015-08-12 MED ORDER — CIPROFLOXACIN HCL 500 MG PO TABS: 500.0000 mg | ORAL_TABLET | Freq: Two times a day (BID) | ORAL | Status: DC

## 2015-08-12 MED ORDER — AMITRIPTYLINE HCL 25 MG PO TABS: 50.0000 mg | ORAL_TABLET | Freq: Every day | ORAL | Status: DC

## 2015-08-12 MED ORDER — ONDANSETRON 4 MG PO TBDP: 4.0000 mg | ORAL_TABLET | Freq: Three times a day (TID) | ORAL | Status: DC | PRN

## 2015-08-12 NOTE — Patient Instructions (Addendum)
Remember to drink plenty of fluid, eat healthy meals and do not skip any meals. Try to eat protein with a every meal and eat a healthy snack such as fruit or nuts in between meals. Try to keep a regular sleep-wake schedule and try to exercise daily, particularly in the form of walking, 20-30 minutes a day, if you can.   As far as your medications are concerned, I would like to suggest:  Amitriptyline 25mg  at night for 2 weeks can increase to 50mg .Every night At onset of migraine takes zomig spray. May take with Cambia and zofran. zofran for nausea. Botox approval   I would like to see you back for botox, sooner if we need to. Please call us with any interim questions, concerns, problems, updates or refill requests.   Our phone number is [phone]. We also have an after hours call service for urgent matters and there is a physician on-call for urgent questions. For any emergencies you know to call 911 or go to the nearest emergency room  Zolmitriptan Nasal Spray  What is this medicine? ZOLMITRIPTAN (zohl mi TRIP tan) is used to treat migraines with or without aura. An aura is a strange feeling or visual disturbance that warns you of an attack. It is not used to prevent migraines. This medicine may be used for other purposes; ask your health care provider or pharmacist if you have questions. What should I tell my health care provider before I take this medicine? They need to know if you have any of these conditions: -bowel disease, colitis or bloody diarrhea -diabetes -family history of heart disease -fast or irregular heartbeat -headaches that are different from your usual migraine -heart or blood vessel disease, angina (chest pain), or previous heart attack -high blood pressure -high cholesterol -history of stroke, transient ischemic attacks (TIAs or mini-strokes), or intracranial bleeding -kidney or liver disease -overweight -postmenopausal or surgical removal of uterus and  ovaries -seizure disorder -tobacco smoker -an unusual or allergic reaction to zolmitriptan, other medicines, foods, dyes, or preservatives -pregnant or trying to get pregnant -breast-feeding How should I use this medicine? This medicine is for use in the nose. Follow the directions on the prescription label. This medicine is taken at the first symptoms of a migraine. It is not for everyday use. Do not take your medicine more often than directed. Talk to your pediatrician regarding the use of this medicine in children. While this drug may be prescribed for children as young as 12 years for selected conditions, precautions do apply. Overdosage: If you think you have taken too much of this medicine contact a poison control center or emergency room at once. NOTE: This medicine is only for you. Do not share this medicine with others. What if I miss a dose? This does not apply; this medicine is not for regular use. What may interact with this medicine? Do not take this medicine with any of the following medicines: -amphetamine or cocaine -dihydroergotamine, ergotamine, ergoloid mesylates, methysergide, or ergot-type medication - do not take within 24 hours of taking zolmitriptan -feverfew -MAOIs like Carbex, Eldepryl, Marplan, Nardil, and Parnate - do not take zolmitriptan within 2 weeks of stopping MAOI therapy -other migraine medicines like almotriptan, eletriptan, naratriptan, rizatriptan, sumatriptan - do not take within 24 hours of taking zolmitriptan -tryptophan This medicine may also interact with the following medications: -cimetidine -birth control pills -medicines for mental depression, anxiety or mood problems -propranolol This list may not describe all possible interactions. Give your health care  provider a list of all the medicines, herbs, non-prescription drugs, or dietary supplements you use. Also tell them if you smoke, drink alcohol, or use illegal drugs. Some items may interact  with your medicine. What should I watch for while using this medicine? Only take this medicine for a migraine headache. Take it if you get warning symptoms or at the start of a migraine attack. It is not for regular use to prevent migraine attacks. You may get drowsy or dizzy. Do not drive, use machinery, or do anything that needs mental alertness until you know how this medicine affects you. To reduce dizzy or fainting spells, do not sit or stand up quickly, especially if you are an older patient. Alcohol can increase drowsiness, dizziness and flushing. Avoid alcoholic drinks. Smoking cigarettes may increase the risk of heart-related side effects from using this medicine. If you take migraine medicines for 10 or more days a month, your migraines may get worse. Keep a diary of headache days and medicine use. Contact your healthcare professional if your migraine attacks occur more frequently. What side effects may I notice from receiving this medicine? Side effects that you should report to your doctor or health care professional as soon as possible: -allergic reactions like skin rash, itching or hives, swelling of the face, lips, or tongue -fast, slow, or irregular heart beat -increased blood pressure -palpitations -severe stomach pain and cramping, bloody diarrhea -signs and symptoms of a blood clot such as breathing problems; changes in vision; chest pain; severe, sudden headache; pain, swelling, warmth in the leg; trouble speaking; sudden numbness or weakness of the face, arm or leg -tingling, pain, or numbness in the face, hands, or feet Side effects that usually do not require medical attention (report to your doctor or health care professional if they continue or are bothersome): -dry mouth -feeling warm, flushing, or redness of the face -headache -muscle cramps, pain -nausea, vomiting -unusually tired or weak This list may not describe all possible side effects. Call your doctor for  medical advice about side effects. You may report side effects to FDA at 1-800-FDA-1088. Where should I keep my medicine? Keep out of the reach of children. Store at room temperature between 20 and 25 degrees C (68 and 77 degrees F). Throw away any unused medicine after the expiration date. NOTE: This sheet is a summary. It may not cover all possible information. If you have questions about this medicine, talk to your doctor, pharmacist, or health care provider.    2016, [employer]. (2013-08-17 15:36:46)    Diclofenac powder for oral solution What is this medicine? DICLOFENAC (dye KLOE fen ak) is a non-steroidal anti-inflammatory drug (NSAID). It is used to treat migraine pain. This medicine may be used for other purposes; ask your health care provider or pharmacist if you have questions. What should I tell my health care provider before I take this medicine? They need to know if you have any of these conditions: -asthma, especially aspirin sensitive asthma -coronary artery bypass graft (CABG) surgery within the past 2 weeks -drink more than 3 alcohol-containing drinks a day -heart disease or circulation problems like heart failure or leg edema (fluid retention) -high blood pressure -kidney disease -liver disease -phenylketonuria -stomach problems -an unusual or allergic reaction to diclofenac, aspirin, other NSAIDs, other medicines, foods, dyes, or preservatives -pregnant or trying to get pregnant -breast-feeding How should I use this medicine? Mix this medicine with 1 to 2 ounces of water. Drink the medicine and water together.  Follow the directions on the prescription label. Do not take your medicine more often than directed. Long-term, continuous use may increase the risk of heart attack or stroke. A special MedGuide will be given to you by the pharmacist with each prescription and refill. Be sure to read this information carefully each time. Talk to your pediatrician  regarding the use of this medicine in children. Special care may be needed. Elderly patients over 34 years old may have a stronger reaction and need a smaller dose. Overdosage: If you think you have taken too much of this medicine contact a poison control center or emergency room at once. NOTE: This medicine is only for you. Do not share this medicine with others. What if I miss a dose? This does not apply. What may interact with this medicine? Do not take this medicine with any of the following medications: -cidofovir -ketorolac -methotrexate This medicine may also interact with the following medications: -alcohol -aspirin and aspirin-like medicines -cyclosporine -diuretics -lithium -medicines for blood pressure -medicines for osteoporosis -medicines that affect platelets -medicines that treat or prevent blood clots like warfarin -NSAIDs, medicines for pain and inflammation, like ibuprofen or naproxen -pemetrexed -steroid medicines like prednisone or cortisone This list may not describe all possible interactions. Give your health care provider a list of all the medicines, herbs, non-prescription drugs, or dietary supplements you use. Also tell them if you smoke, drink alcohol, or use illegal drugs. Some items may interact with your medicine. What should I watch for while using this medicine? Tell your doctor or health care professional if your pain does not get better. Talk to your doctor before taking another medicine for pain. Do not treat yourself. This medicine does not prevent heart attack or stroke. In fact, this medicine may increase the chance of a heart attack or stroke. The chance may increase with longer use of this medicine and in people who have heart disease. If you take aspirin to prevent heart attack or stroke, talk with your doctor or health care professional. Do not take medicines such as ibuprofen and naproxen with this medicine. Side effects such as stomach upset,  nausea, or ulcers may be more likely to occur. Many medicines available without a prescription should not be taken with this medicine. This medicine can cause ulcers and bleeding in the stomach and intestines at any time during treatment. Do not smoke cigarettes or drink alcohol. These increase irritation to your stomach and can make it more susceptible to damage from this medicine. Ulcers and bleeding can happen without warning symptoms and can cause death. You may get drowsy or dizzy. Do not drive, use machinery, or do anything that needs mental alertness until you know how this medicine affects you. Do not stand or sit up quickly, especially if you are an older patient. This reduces the risk of dizzy or fainting spells. This medicine can cause you to bleed more easily. Try to avoid damage to your teeth and gums when you brush or floss your teeth. If you take migraine medicines for 10 or more days a month, your migraines may get worse. Keep a diary of headache days and medicine use. Contact your healthcare professional if your migraine attacks occur more frequently. What side effects may I notice from receiving this medicine? Side effects that you should report to your doctor or health care professional as soon as possible: -allergic reactions like skin rash, itching or hives, swelling of the face, lips, or tongue -black or bloody stools, blood  in the urine or vomit -blurred vision -chest pain -difficulty breathing or wheezing -nausea or vomiting -fever -redness, blistering, peeling or loosening of the skin, including inside the mouth -slurred speech or weakness on one side of the body -trouble passing urine or change in the amount of urine -unexplained weight gain or swelling -unusually weak or tired -yellowing of eyes or skin Side effects that usually do not require medical attention (Report these to your doctor or health care professional if they continue or are  bothersome.): -constipation -diarrhea -dizziness -headache -heartburn This list may not describe all possible side effects. Call your doctor for medical advice about side effects. You may report side effects to FDA at 1-800-FDA-1088. Where should I keep my medicine? Keep out of the reach of children. Store at room temperature between 15 and 30 degrees C (59 and 86 degrees F). Throw away any unused medicine after the expiration date. NOTE: This sheet is a summary. It may not cover all possible information. If you have questions about this medicine, talk to your doctor, pharmacist, or health care provider.    2016, [employer] Standard. (2012-10-04 10:55:34)  Amitriptyline tablets What is this medicine? AMITRIPTYLINE (a mee TRIP ti leen) is used to treat depression. This medicine may be used for other purposes; ask your health care provider or pharmacist if you have questions. What should I tell my health care provider before I take this medicine? They need to know if you have any of these conditions: -an alcohol problem -asthma, difficulty breathing -bipolar disorder or schizophrenia -difficulty passing urine, prostate trouble -glaucoma -heart disease or previous heart attack -liver disease -over active thyroid -seizures -thoughts or plans of suicide, a previous suicide attempt, or family history of suicide attempt -an unusual or allergic reaction to amitriptyline, other medicines, foods, dyes, or preservatives -pregnant or trying to get pregnant -breast-feeding How should I use this medicine? Take this medicine by mouth with a drink of water. Follow the directions on the prescription label. You can take the tablets with or without food. Take your medicine at regular intervals. Do not take it more often than directed. Do not stop taking this medicine suddenly except upon the advice of your doctor. Stopping this medicine too quickly may cause serious side effects or your condition  may worsen. A special MedGuide will be given to you by the pharmacist with each prescription and refill. Be sure to read this information carefully each time. Talk to your pediatrician regarding the use of this medicine in children. Special care may be needed. Overdosage: If you think you have taken too much of this medicine contact a poison control center or emergency room at once. NOTE: This medicine is only for you. Do not share this medicine with others. What if I miss a dose? If you miss a dose, take it as soon as you can. If it is almost time for your next dose, take only that dose. Do not take double or extra doses. What may interact with this medicine? Do not take this medicine with any of the following medications: -arsenic trioxide -certain medicines used to regulate abnormal heartbeat or to treat other heart conditions -cisapride -droperidol -halofantrine -linezolid -MAOIs like Carbex, Eldepryl, Marplan, Nardil, and Parnate -methylene blue -other medicines for mental depression -phenothiazines like perphenazine, thioridazine and chlorpromazine -pimozide -probucol -procarbazine -sparfloxacin -St. John's Wort -ziprasidone This medicine may also interact with the following medications: -atropine and related drugs like hyoscyamine, scopolamine, tolterodine and others -barbiturate medicines for inducing sleep or  treating seizures, like phenobarbital -cimetidine -disulfiram -ethchlorvynol -thyroid hormones such as levothyroxine This list may not describe all possible interactions. Give your health care provider a list of all the medicines, herbs, non-prescription drugs, or dietary supplements you use. Also tell them if you smoke, drink alcohol, or use illegal drugs. Some items may interact with your medicine. What should I watch for while using this medicine? Tell your doctor if your symptoms do not get better or if they get worse. Visit your doctor or health care professional  for regular checks on your progress. Because it may take several weeks to see the full effects of this medicine, it is important to continue your treatment as prescribed by your doctor. Patients and their families should watch out for new or worsening thoughts of suicide or depression. Also watch out for sudden changes in feelings such as feeling anxious, agitated, panicky, irritable, hostile, aggressive, impulsive, severely restless, overly excited and hyperactive, or not being able to sleep. If this happens, especially at the beginning of treatment or after a change in dose, call your health care professional. Dennis Bast may get drowsy or dizzy. Do not drive, use machinery, or do anything that needs mental alertness until you know how this medicine affects you. Do not stand or sit up quickly, especially if you are an older patient. This reduces the risk of dizzy or fainting spells. Alcohol may interfere with the effect of this medicine. Avoid alcoholic drinks. Do not treat yourself for coughs, colds, or allergies without asking your doctor or health care professional for advice. Some ingredients can increase possible side effects. Your mouth may get dry. Chewing sugarless gum or sucking hard candy, and drinking plenty of water will help. Contact your doctor if the problem does not go away or is severe. This medicine may cause dry eyes and blurred vision. If you wear contact lenses you may feel some discomfort. Lubricating drops may help. See your eye doctor if the problem does not go away or is severe. This medicine can cause constipation. Try to have a bowel movement at least every 2 to 3 days. If you do not have a bowel movement for 3 days, call your doctor or health care professional. This medicine can make you more sensitive to the sun. Keep out of the sun. If you cannot avoid being in the sun, wear protective clothing and use sunscreen. Do not use sun lamps or tanning beds/booths. What side effects may I  notice from receiving this medicine? Side effects that you should report to your doctor or health care professional as soon as possible: -allergic reactions like skin rash, itching or hives, swelling of the face, lips, or tongue -abnormal production of milk in females -breast enlargement in both males and females -breathing problems -confusion, hallucinations -fast, irregular heartbeat -fever with increased sweating -muscle stiffness, or spasms -pain or difficulty passing urine, loss of bladder control -seizures -suicidal thoughts or other mood changes -swelling of the testicles -tingling, pain, or numbness in the feet or hands -yellowing of the eyes or skin Side effects that usually do not require medical attention (report to your doctor or health care professional if they continue or are bothersome): -change in sex drive or performance -constipation or diarrhea -nausea, vomiting -weight gain or loss This list may not describe all possible side effects. Call your doctor for medical advice about side effects. You may report side effects to FDA at 1-800-FDA-1088. Where should I keep my medicine? Keep out of the reach of  children. Store at room temperature between 20 and 25 degrees C (68 and 77 degrees F). Throw away any unused medicine after the expiration date. NOTE: This sheet is a summary. It may not cover all possible information. If you have questions about this medicine, talk to your doctor, pharmacist, or health care provider.    2016, [employer]. (2011-06-22 13:50:32)

## 2015-08-12 NOTE — Progress Notes (Signed)
[facility]    Provider:  Dr Jaynee Eagles Referring Provider: Bobetta Lime, MD Primary Care Physician:  Bobetta Lime, MD  CC:  headaches HPI:  Margaret Silva is a 35 y.o. female here as a referral from Dr. Nadine Counts for headaches. Past medical history migraines, fibromyalgia, interstitial cystitis, diverticulitis, endometriosis, ADHD, dyslexia, restless legs, bursitis, plantar fasciitis, IBS. Migraines Since a teenager. She is on Cymbalta which helps. She works around a lot of dyes which have exacerbated her migraines. Migraines are on the right side. It is sharp, pounding, going up the neck on the back, sometimes the other side. Lots of sound and light sensitivity. Lots of nausea. Occurs several times a week, lasts all day. She has headaches every day, 30 headache days a month, At least 8 are bad migraine days. Has tried TCA, verapamil,propanolol. She is in the lab now which may be better as less triggers. Cymbalta was just increased. She takes Tylenol 3-4x a week only, no medication overuse . Shutting herslef off in a dark room helps. No aura. Computer overuse makes it worse. She takes nothing for the nausea. Sumatriptan used to work not anymore. Zomig nasal and the cambia helped. Sometimes the migraines are immediate but usually a more gradual increase in severity. The low-level headaches are usually as she go to work every day. The headaches are at the base of the skull, all day long, 1-2 level, She does not take the robaxin. No other focal neurologic deficits or complaints.  Currently: cymbalta, imitrex, on Depakote, topamax.  Has tried verapamil, propranolol, amitriptyline in the past. She has also tried naproxen, ibuprofen, indomethacin.  Reviewed notes, labs and imaging from outside physicians, which showed: Reviewed notes from [facility] orthopedics rheumatology. Patient has fibromyalgia syndrome. Fibromyalgia has been active. Her generalized pain scale of 0-10 has  been about 6-8 in her fatigue has been about 7. She has plantar fasciitis in her left foot going for a few months now. She had cortisone injections, podiatrist. She also had physical therapy which she has not done yet that she has been doing some splinting at night also some exercises without much result. She went on a mission trip to [place] and since then has been having pain in her left hip and bilateral knee joints without joint swelling. She has a history of morning stiffness, discomfort walking and doing routine activities. She is 16 out of 18 points positive on her fibromyalgia. Her fibromyalgia is active with generalized pain and positive tender points. She has mild osteoarthritis. She is overweight. Weight loss, diet, exercise and muscle strengthening exercises were discussed. IT band exercises and muscle strengthening exercises were discussed. She was referred to physical therapy for plantar fasciitis and left trochanteric bursitis. She also has insomnia, history of IBS, bilateral trapezius muscle spasms.  Reviewed image reports. X-rays of left hip joint 2 views, were within normal limits. X-rays of bilateral knee joints, 2 views, showed mild medial compartment narrowing and some lateral osteophytes with mild patellofemoral narrowing without any chondrocalcinosis.  Reviewed lab reports, CMP was normal with normal creatinine 0.94. This was taken on January 2016.  Cholesterol high T10, LDL elevated at 133, TSH within normal limits, CBC normal.  Review of Systems: Patient complains of symptoms per HPI as well as the following symptoms: Fatigue, chest pain, palpitations, blurred vision, shortness of breath, wheezing, snoring, feeling cold, joint pain, aching muscles, allergies, frequent infection, headache, numbness, weakness, dizziness, insomnia, sleepiness, restless legs. Pertinent negatives per HPI. All others negative.  Social History   Social History  . Marital Status: Divorced    Spouse  Name: N/A  . Number of Children: 0  . Years of Education: 12    Occupational History  . [employer]     Social History Main Topics  . Smoking status: Never Smoker   . Smokeless tobacco: Never Used  . Alcohol Use: 0.0 oz/week    0 Standard drinks or equivalent per week     Comment: occasionally  . Drug Use: No  . Sexual Activity:    Partners: Male    Birth Control/ Protection: Implant   Other Topics Concern  . Not on file   Social History Narrative   Lives with dog   Caffeine use:  Every week     Family History  Problem Relation Age of Onset  . Heart attack Paternal Grandfather   . Cancer Paternal Grandfather     Pancreatic/ Bladder Cancer/prostate  . Arthritis Paternal Grandfather   . Heart disease Paternal Grandfather   . Cancer Paternal Grandmother     Liver cancer/lung cancer  . Diabetes Paternal Grandmother   . Diabetes Maternal Grandmother   . Arthritis Maternal Grandmother   . Kidney disease Maternal Grandmother   . Heart attack Maternal Grandfather   . Heart disease Maternal Grandfather   . Hypertension Maternal Grandfather   . Mental illness Maternal Grandfather   . Hypertension Father   . Hyperlipidemia Father   . Fibromyalgia Mother   . Migraines Mother   . Ehlers-Danlos syndrome Sister   . Fibromyalgia Sister     Past Medical History  Diagnosis Date  . History of colonoscopy 11/04 and April 06  . History of laparoscopy 12/04 and 12/07  . Interstitial cystitis 01/03/2004    hydrodistilation  . 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 History of laparoscopy 01/29/2006    Dr. Georga Bora Endometriosis  . Endometriosis   . Asthma   . History of chicken pox   . Diverticulitis     h/o  . GERD (gastroesophageal reflux disease)   . Hx of migraines   . Jaundice     h/o  . Hx: UTI (urinary tract infection)   . Plantar fasciitis, bilateral   . Bursitis of left hip   . Headache     Past Surgical  History  Procedure Laterality Date  . Diagnostic laparoscopy  02/16/2003  . Sigmoidoscopy  05/26/10  . Cystoscopy with hydrodistension and biopsy      Current Outpatient Prescriptions  Medication Sig Dispense Refill  . cholecalciferol (VITAMIN D) 1000 UNITS tablet Take 1,000 Units by mouth daily.    . divalproex (DEPAKOTE) 500 MG DR tablet Take 1 tablet (500 mg total) by mouth daily. 30 tablet 0  . DULoxetine (CYMBALTA) 30 MG capsule Take 3 capsules (90 mg total) by mouth daily. 90 capsule 0  . fluticasone (FLONASE) 50 MCG/ACT nasal spray Place 2 sprays into both nostrils daily.     Marland Kitchen gentamicin cream (GARAMYCIN) 0.1 % Apply 1 application topically as needed (for nose).    Marland Kitchen ibuprofen (ADVIL,MOTRIN) 800 MG tablet Take 1 tablet by mouth as needed.     Marland Kitchen levonorgestrel (MIRENA) 20 MCG/24HR IUD 1 each by Intrauterine route once.      . loratadine (CLARITIN) 10 MG tablet Take 10 mg by mouth daily.    . methocarbamol (ROBAXIN) 500 MG tablet 1/2 - 1 tab at 7 AM, and 2  PM prn    . omeprazole (PRILOSEC) 20 MG capsule TAKE 1 CAPSULE BY MOUTH DAILY 90 capsule 3  . OVER THE COUNTER MEDICATION Apply 1 application topically as needed. Topical cream for foot fungus    . SUMAtriptan (IMITREX) 100 MG tablet Take 100 mg by mouth every 2 (two) hours as needed for migraine.    . valACYclovir (VALTREX) 500 MG tablet Take 1 tablet (500 mg total) by mouth daily. 90 tablet 1  . VENTOLIN HFA 108 (90 BASE) MCG/ACT inhaler     . zolpidem (AMBIEN) 10 MG tablet Take 10 mg by mouth at bedtime.  5  . zolpidem (AMBIEN) 5 MG tablet Take 1 tablet (5 mg total) by mouth at bedtime as needed for sleep. 30 tablet 2  . amitriptyline (ELAVIL) 25 MG tablet Take 2 tablets (50 mg total) by mouth at bedtime. 30 tablet 3  . ciprofloxacin (CIPRO) 500 MG tablet Take 1 tablet (500 mg total) by mouth 2 (two) times daily. 28 tablet 0  . ondansetron (ZOFRAN-ODT) 4 MG disintegrating tablet Take 1 tablet (4 mg total) by mouth every 8 (eight)  hours as needed for nausea. 30 tablet 12  . zolmitriptan (ZOMIG) 5 MG nasal solution Place 1 spray into the nose as needed for migraine. 12 Units 12   No current facility-administered medications for this visit.    Allergies as of 08/12/2015 - Review Complete 08/12/2015  Allergen Reaction Noted  . Coffee bean extract  [coffea arabica] Nausea Only 09/04/2014  . Other  08/12/2015  . Trazodone and nefazodone Other (See Comments) 08/03/2013    Vitals: BP 115/78 mmHg  Pulse 79  Ht 5\' 4"  (1.626 m)  Wt 178 lb (80.74 kg)  BMI 30.54 kg/m2  SpO2 98% Last Weight:  Wt Readings from Last 1 Encounters:  08/12/15 178 lb (80.74 kg)   Last Height:   Ht Readings from Last 1 Encounters:  08/12/15 5\' 4"  (1.626 m)   Physical exam: Exam: Gen: NAD, conversant, well nourised, obese, well groomed                     CV: RRR, no MRG. No Carotid Bruits. No peripheral edema, warm, nontender Eyes: Conjunctivae clear without exudates or hemorrhage  Neuro: Detailed Neurologic Exam  Speech:    Speech is normal; fluent and spontaneous with normal comprehension.  Cognition:    The patient is oriented to person, place, and time;     recent and remote memory intact;     language fluent;     normal attention, concentration,     fund of knowledge Cranial Nerves:    The pupils are equal, round, and reactive to light. The fundi are normal and spontaneous venous pulsations are present. Visual fields are full to finger confrontation. Extraocular movements are intact. Trigeminal sensation is intact and the muscles of mastication are normal. The face is symmetric. The palate elevates in the midline. Hearing intact. Voice is normal. Shoulder shrug is normal. The tongue has normal motion without fasciculations.   Coordination:    Normal finger to nose and heel to shin. Normal rapid alternating movements.   Gait:    Heel-toe and tandem gait are normal.   Motor Observation:    No asymmetry, no atrophy, and no  involuntary movements noted. Tone:    Normal muscle tone.    Posture:    Posture is normal. normal erect    Strength:    Strength is V/V in the upper and  lower limbs.      Sensation: intact to LT     Reflex Exam:  DTR's:    Deep tendon reflexes in the upper and lower extremities are normal bilaterally.   Toes:    The toes are downgoing bilaterally.   Clonus:    Clonus is absent.       Assessment/Plan:  35 year old with chronic migraines, non-intractable, without status migrainosus, without aura.  Acute management migraines: Zomig, cambia, zofran Amitriptyline at night, may help with migraines as well as fibromyalgia and insomnia.  As far as your medications are concerned, I would like to suggest:  Amitriptyline 25mg  at night for 2 weeks can increase to 50mg .Every night At onset of migraine takes zomig spray. May take with Cambia and zofran. zofran for nausea. Suggest Botox for migraine  To prevent or relieve headaches, try the following: Cool Compress. Lie down and place a cool compress on your head.  Avoid headache triggers. If certain foods or odors seem to have triggered your migraines in the past, avoid them. A headache diary might help you identify triggers.  Include physical activity in your daily routine. Try a daily walk or other moderate aerobic exercise.  Manage stress. Find healthy ways to cope with the stressors, such as delegating tasks on your to-do list.  Practice relaxation techniques. Try deep breathing, yoga, massage and visualization.  Eat regularly. Eating regularly scheduled meals and maintaining a healthy diet might help prevent headaches. Also, drink plenty of fluids.  Follow a regular sleep schedule. Sleep deprivation might contribute to headaches Consider biofeedback. With this mind-body technique, you learn to control certain bodily functions - such as muscle tension, heart rate and blood pressure - to prevent headaches or reduce headache pain.     Proceed to emergency room if you experience new or worsening symptoms or symptoms do not resolve, if you have new neurologic symptoms or if headache is severe, or for any concerning symptom.   Cc: Dr. Irine Seal, [facility] [address] [place], [place] [address]  Phone [phone] Fax [phone]

## 2015-08-12 NOTE — Progress Notes (Signed)
Gave Toradol 60mg/2ml IM in right deltoid. Cleaned with alcohol wipe prior to injection. Band-aid applied. Pt tolerated well.   

## 2015-08-14 ENCOUNTER — Encounter: Payer: Self-pay | Admitting: Neurology

## 2015-08-14 DIAGNOSIS — G43709 Chronic migraine without aura, not intractable, without status migrainosus: Secondary | ICD-10-CM | POA: Insufficient documentation

## 2015-08-15 ENCOUNTER — Other Ambulatory Visit: Payer: Self-pay | Admitting: Family Medicine

## 2015-08-15 ENCOUNTER — Encounter: Payer: Self-pay | Admitting: *Deleted

## 2015-08-15 ENCOUNTER — Telehealth: Payer: Self-pay | Admitting: Family Medicine

## 2015-08-15 NOTE — Telephone Encounter (Signed)
Please see if patient has transferred care I see that she has filled two prescriptions for ambien since leaving here We are sure that her new provider will be able to refill her cymbalta Thank you

## 2015-08-15 NOTE — Progress Notes (Signed)
Received update from [facility] that they received prescription request and they are processing.

## 2015-08-15 NOTE — Progress Notes (Signed)
Faxed cambia enrollment form to [facility]. Fax: [phone]. Received confirmation.

## 2015-08-23 ENCOUNTER — Institutional Professional Consult (permissible substitution): Payer: Self-pay | Admitting: Physician Assistant

## 2015-08-27 ENCOUNTER — Telehealth: Payer: Self-pay | Admitting: Neurology

## 2015-08-27 NOTE — Telephone Encounter (Signed)
Spoke w/ Dr Jaynee Eagles. We received PA form from [email or web address] today. We will fill this out and complete. Faxed completed form to [phone].

## 2015-08-27 NOTE — Telephone Encounter (Signed)
Melia/Avella [phone] called sts Cathren Harsh is requesting more information for PA. [facility] [phone] told her request has been faxed a couple of times, she doesn't want this to go to appeals. If provider could call [employer] to expedite.

## 2015-08-28 NOTE — Telephone Encounter (Addendum)
Received fax confirmation, waiting for determination.

## 2015-08-29 ENCOUNTER — Telehealth: Payer: Self-pay | Admitting: *Deleted

## 2015-08-29 NOTE — Telephone Encounter (Signed)
Called and spoke to [place] at [facility] re: Cambia denial. Stating "Your claims history do not show that you have tried sumatriptan. Please speak to your doctor about generic sumatriptan" I advised Estill Bamberg that I put that pt tried sumatriptan on form. She stated we would have to file an appeal. Advised I would like a reconsideration of information. She will put a note in for claims department to contact us tomorrow. Advised I will not be in the office tomorrow but someone should be here to take message. She verbalized understanding.

## 2015-09-01 NOTE — Assessment & Plan Note (Signed)
Could consider requip; patient says she already had a sleep study

## 2015-09-11 ENCOUNTER — Other Ambulatory Visit: Payer: Self-pay | Admitting: *Deleted

## 2015-09-11 NOTE — Telephone Encounter (Signed)
Call patient and let her know that her cambia was denied.We can try again at a later date.

## 2015-09-11 NOTE — Telephone Encounter (Signed)
Called Margaret Silva. Relayed Cambia denied by her insurance company. Advised we will try at a later time to try and get it approved via insurance per Dr Jaynee Eagles.  Margaret Silva also stated her PCP left the practice she was at and no longer has PCP. She cannot get appt until September. She stated she was advised to go to walk-in clinic and get refills on meds, but they would not provide refills. I recommended she call PCP office and see if they can provide her refills until her scheduled appt since PCP left practice. I also recommended she ask to be put on their cx list to be seen sooner. She verbalized understanding. She wanted to know if Dr Jaynee Eagles was willing to refill her Depakote at least because this is part of her migraine prevention. Advised I will speak to Dr Jaynee Eagles and if ok, I will send to her pharmacy if ok. If not, I will call her back. She verbalized understanding.

## 2015-09-11 NOTE — Telephone Encounter (Signed)
Dr Jaynee Eagles- please advise. PA denied for cambia. States she must try diclofenac first.

## 2015-09-11 NOTE — Telephone Encounter (Signed)
Dr Ahern- please advise 

## 2015-09-11 NOTE — Telephone Encounter (Signed)
Benita Gutter, RN resubmitted Dr Cathren Laine last OV note last week while I was out of the office. Received PA determination that: "PA DENIED, no evidence of successful trial of diclofenac found that would justify trying the dissolving powder form" Ticket [phone]

## 2015-09-12 NOTE — Telephone Encounter (Signed)
Called and left a message asked her to call back. Left message regarding teratogenicity of Depakote, I will prescribe but I need her to understand that Depakote can cause serious birth defects and she should not get pregnant on this medication, she should use multiple forms of birth control if she is sexually active. If she calls back you can discuss with her and document conversation please. I usually don't put young women on Depakote, so I need her to understand the serious risks including  a variety of major and minor malformations, including a 20-fold increase in neural tube defects, cleft lip and palate, cardiovascular abnormalities, genitourinary defects, developmental delay, endocrinological disorders, limb defects, and autism.  Please try calling back patient and let her know the above, if she acknowledges understanding and still wants to proceed with Depakote I can refill it since she has been on it already but again this is something I do not recommend for young women of child-bearing age. thanks

## 2015-09-13 ENCOUNTER — Other Ambulatory Visit: Payer: Self-pay | Admitting: *Deleted

## 2015-09-13 DIAGNOSIS — G43009 Migraine without aura, not intractable, without status migrainosus: Secondary | ICD-10-CM

## 2015-09-13 MED ORDER — DIVALPROEX SODIUM 500 MG PO DR TAB: 500.0000 mg | DELAYED_RELEASE_TABLET | Freq: Every day | ORAL | 11 refills | Status: DC

## 2015-09-13 NOTE — Telephone Encounter (Signed)
Called and spoke to pt. Relayed Dr Jaynee Eagles message below in detail. Pt verbalized understanding and aware of risk factors associated with depakote. She states "I cannot get pregnant, so I am okay".  Advised Dr Jaynee Eagles will go ahead and refill her depakote at this time. She verbalized understanding.

## 2015-09-13 NOTE — Telephone Encounter (Signed)
Okay per Dr Jaynee Eagles to provide 11 refills on rx depakote.

## 2015-09-30 ENCOUNTER — Ambulatory Visit (INDEPENDENT_AMBULATORY_CARE_PROVIDER_SITE_OTHER): Payer: BLUE CROSS/BLUE SHIELD | Admitting: Family Medicine

## 2015-09-30 ENCOUNTER — Encounter: Payer: Self-pay | Admitting: Family Medicine

## 2015-09-30 VITALS — BP 112/76 | HR 91 | Temp 98.4°F | Wt 190.2 lb

## 2015-09-30 DIAGNOSIS — F5104 Psychophysiologic insomnia: Secondary | ICD-10-CM

## 2015-09-30 DIAGNOSIS — K625 Hemorrhage of anus and rectum: Secondary | ICD-10-CM

## 2015-09-30 DIAGNOSIS — G4700 Insomnia, unspecified: Secondary | ICD-10-CM

## 2015-09-30 DIAGNOSIS — K589 Irritable bowel syndrome without diarrhea: Secondary | ICD-10-CM

## 2015-09-30 DIAGNOSIS — A6000 Herpesviral infection of urogenital system, unspecified: Secondary | ICD-10-CM | POA: Insufficient documentation

## 2015-09-30 DIAGNOSIS — B009 Herpesviral infection, unspecified: Secondary | ICD-10-CM

## 2015-09-30 DIAGNOSIS — K219 Gastro-esophageal reflux disease without esophagitis: Secondary | ICD-10-CM | POA: Insufficient documentation

## 2015-09-30 MED ORDER — DULOXETINE HCL 30 MG PO CPEP: 90.0000 mg | ORAL_CAPSULE | Freq: Every day | ORAL | 3 refills | Status: DC

## 2015-09-30 MED ORDER — ZOLPIDEM TARTRATE 5 MG PO TABS: 5.0000 mg | ORAL_TABLET | Freq: Every day | ORAL | 0 refills | Status: DC

## 2015-09-30 MED ORDER — VALACYCLOVIR HCL 500 MG PO TABS: 500.0000 mg | ORAL_TABLET | Freq: Every day | ORAL | 1 refills | Status: DC

## 2015-09-30 MED ORDER — OMEPRAZOLE 20 MG PO CPDR: 20.0000 mg | DELAYED_RELEASE_CAPSULE | Freq: Every day | ORAL | 3 refills | Status: DC

## 2015-09-30 NOTE — Assessment & Plan Note (Signed)
On Cymbalta for GI symptoms per her report. Has been stable for several years. Has had issues for over a decade. Continue Cymbalta.

## 2015-09-30 NOTE — Assessment & Plan Note (Signed)
Patient with reflux that is well controlled though when she does not take her medicine she gets reflux. No prior EGD. Does note some blood in her stool. Patient deferred rectal exam today. We'll have her complete stool cards. Obtain a CBC. Refer to GI for evaluation of blood in her stool and GERD. Continue Prilosec. Given return precautions.

## 2015-09-30 NOTE — Progress Notes (Signed)
Tommi Rumps, MD Phone: [phone]  Margaret Silva is a 35 y.o. female who presents today for follow-up and to reestablish care at our office.  GERD: Patient notes this is well controlled on Prilosec. Notes if she does not take it she gets a fair amount of burning discomfort. Worsened by acidic foods. No symptoms while on Prilosec. Has seen GI previously but never had an EGD. Occasionally notes some blood in her stool when she wipes and attributes this to hemorrhoids. Notes it is a small amount on the stool and when she wipes. No lightheadedness, chest pain, or shortness of breath, or palpitations.  Genital herpes: Takes Valtrex 500 mg daily for this. Has an outbreak about once a year. Last outbreak was September of last year. No symptoms at this time.  Insomnia: Has had issues with this since she was 35 years old intermittently. Has been on Ambien intermittently since that time. Sleeps well with it. If she is off the Ambien she wakes up about every hour. Only takes during the week. Does not make her drowsy.  Reports she is on Cymbalta for fibromyalgia and IBS. Notes intermittent issues with constipation. Notes in the past she had fairly frequent abdominal discomfort surrounding her periods though that is significantly improved to about 1 day a month. Has an IUD in place. No abdominal pain at this time. Notes the Cymbalta helped significantly with her constipation, fibromyalgia, and abdominal discomfort.  PMH: nonsmoker.   ROS see history of present illness  Objective  Physical Exam Vitals:   09/30/15 1325  BP: 112/76  Pulse: 91  Temp: 98.4 F (36.9 C)    BP Readings from Last 3 Encounters:  09/30/15 112/76  08/12/15 115/78  07/31/15 112/72   Wt Readings from Last 3 Encounters:  09/30/15 190 lb 3.2 oz (86.3 kg)  08/12/15 178 lb (80.7 kg)  07/31/15 179 lb (81.2 kg)    Physical Exam  Constitutional: No distress.  HENT:  Head: Normocephalic and atraumatic.    Mouth/Throat: Oropharynx is clear and moist.  Eyes: Conjunctivae are normal. Pupils are equal, round, and reactive to light.  Cardiovascular: Normal rate, regular rhythm and normal heart sounds.   Pulmonary/Chest: Effort normal and breath sounds normal.  Abdominal: Soft. Bowel sounds are normal. She exhibits no distension. There is no tenderness. There is no rebound and no guarding.  Neurological: She is alert. Gait normal.  Skin: Skin is warm and dry. She is not diaphoretic.     Assessment/Plan: Please see individual problem list.  Chronic insomnia Chronic issue. Discussed one month refill of Ambien 5 mg daily and following up in a month.  GERD (gastroesophageal reflux disease) Patient with reflux that is well controlled though when she does not take her medicine she gets reflux. No prior EGD. Does note some blood in her stool. Patient deferred rectal exam today. We'll have her complete stool cards. Obtain a CBC. Refer to GI for evaluation of blood in her stool and GERD. Continue Prilosec. Given return precautions.  Genital herpes Refill Valtrex for suppression.  IBS (irritable bowel syndrome) On Cymbalta for GI symptoms per her report. Has been stable for several years. Has had issues for over a decade. Continue Cymbalta.   Orders Placed This Encounter  Procedures  . CBC  . Ambulatory referral to Gastroenterology    Referral Priority:   Routine    Referral Type:   Consultation    Referral Reason:   Specialty Services Required    Number of Visits  Requested:   1    Meds ordered this encounter  Medications  . zolpidem (AMBIEN) 5 MG tablet    Sig: Take 1 tablet (5 mg total) by mouth at bedtime.    Dispense:  30 tablet    Refill:  0  . valACYclovir (VALTREX) 500 MG tablet    Sig: Take 1 tablet (500 mg total) by mouth daily.    Dispense:  90 tablet    Refill:  1  . omeprazole (PRILOSEC) 20 MG capsule    Sig: Take 1 capsule (20 mg total) by mouth daily.    Dispense:  90  capsule    Refill:  3  . DULoxetine (CYMBALTA) 30 MG capsule    Sig: Take 3 capsules (90 mg total) by mouth daily.    Dispense:  90 capsule    Refill:  [place], MD [place]

## 2015-09-30 NOTE — Assessment & Plan Note (Signed)
Chronic issue. Discussed one month refill of Ambien 5 mg daily and following up in a month.

## 2015-09-30 NOTE — Progress Notes (Signed)
Pre visit review using our clinic review tool, if applicable. No additional management support is needed unless otherwise documented below in the visit note. 

## 2015-09-30 NOTE — Assessment & Plan Note (Addendum)
Refill Valtrex for suppression.

## 2015-09-30 NOTE — Patient Instructions (Signed)
Nice to meet you. We will refill your medications. If you become too drowsy with the Ambien please stop and let us know. We'll refer you to GI for evaluation of your reflux and the blood in her stool. If you develop persistent abdominal discomfort, increased bleeding from your rectum, light headedness, or any new or changing symptoms please seek medical attention.

## 2015-10-01 NOTE — Telephone Encounter (Signed)
COMPLETE

## 2015-10-02 ENCOUNTER — Encounter: Payer: Self-pay | Admitting: Family Medicine

## 2015-10-22 ENCOUNTER — Ambulatory Visit (INDEPENDENT_AMBULATORY_CARE_PROVIDER_SITE_OTHER): Payer: BLUE CROSS/BLUE SHIELD | Admitting: Family Medicine

## 2015-10-22 ENCOUNTER — Encounter: Payer: Self-pay | Admitting: Family Medicine

## 2015-10-22 ENCOUNTER — Other Ambulatory Visit: Payer: Self-pay | Admitting: Neurology

## 2015-10-22 DIAGNOSIS — Z30433 Encounter for removal and reinsertion of intrauterine contraceptive device: Secondary | ICD-10-CM | POA: Diagnosis not present

## 2015-10-22 MED ORDER — LEVONORGESTREL 18.6 MCG/DAY IU IUD
INTRAUTERINE_SYSTEM | Freq: Once | INTRAUTERINE | Status: AC
  Administered 2015-10-22: 17:00:00 via INTRAUTERINE

## 2015-10-22 NOTE — Patient Instructions (Signed)
Intrauterine Device Insertion Most often, an intrauterine device (IUD) is inserted into the uterus to prevent pregnancy. There are 2 types of IUDs available:  Copper IUD--This type of IUD creates an environment that is not favorable to sperm survival. The mechanism of action of the copper IUD is not known for certain. It can stay in place for 10 years.  Hormone IUD--This type of IUD contains the hormone progestin (synthetic progesterone). The progestin thickens the cervical mucus and prevents sperm from entering the uterus, and it also thins the uterine lining. There is no evidence that the hormone IUD prevents implantation. One hormone IUD can stay in place for up to 5 years, and a different hormone IUD can stay in place for up to 3 years. An IUD is the most cost-effective birth control if left in place for the full duration. It may be removed at any time. LET YOUR HEALTH CARE PROVIDER KNOW ABOUT:  Any allergies you have.  All medicines you are taking, including vitamins, herbs, eye drops, creams, and over-the-counter medicines.  Previous problems you or members of your family have had with the use of anesthetics.  Any blood disorders you have.  Previous surgeries you have had.  Possibility of pregnancy.  Medical conditions you have. RISKS AND COMPLICATIONS  Generally, intrauterine device insertion is a safe procedure. However, as with any procedure, complications can occur. Possible complications include:  Accidental puncture (perforation) of the uterus.  Accidental placement of the IUD either in the muscle layer of the uterus (myometrium) or outside the uterus. If this happens, the IUD can be found essentially floating around the bowels and must be taken out surgically.  The IUD may fall out of the uterus (expulsion). This is more common in women who have recently had a child.   Pregnancy in the fallopian tube (ectopic).  Pelvic inflammatory disease (PID), which is infection of  the uterus and fallopian tubes. The risk of PID is slightly increased in the first 20 days after the IUD is placed, but the overall risk is still very low. BEFORE THE PROCEDURE  Schedule the IUD insertion for when you will have your menstrual period or right after, to make sure you are not pregnant. Placement of the IUD is better tolerated shortly after a menstrual cycle.  You may need to take tests or be examined to make sure you are not pregnant.  You may be required to take a pregnancy test.  You may be required to get checked for sexually transmitted infections (STIs) prior to placement. Placing an IUD in someone who has an infection can make the infection worse.  You may be given a pain reliever to take 1 or 2 hours before the procedure.  An exam will be performed to determine the size and position of your uterus.  Ask your health care provider about changing or stopping your regular medicines. PROCEDURE   A tool (speculum) is placed in the vagina. This allows your health care provider to see the lower part of the uterus (cervix).  The cervix is prepped with a medicine that lowers the risk of infection.  You may be given a medicine to numb each side of the cervix (intracervical or paracervical block). This is used to block and control any discomfort with insertion.  A tool (uterine sound) is inserted into the uterus to determine the length of the uterine cavity and the direction the uterus may be tilted.  A slim instrument (IUD inserter) is inserted through the cervical   canal and into your uterus.  The IUD is placed in the uterine cavity and the insertion device is removed.  The nylon string that is attached to the IUD and used for eventual IUD removal is trimmed. It is trimmed so that it lays high in the vagina, just outside the cervix. AFTER THE PROCEDURE  You may have bleeding after the procedure. This is normal. It varies from light spotting for a few days to menstrual-like  bleeding.  You may have mild cramping.   This information is not intended to replace advice given to you by your health care provider. Make sure you discuss any questions you have with your health care provider.   Document Released: 10/01/2010 Document Revised: 11/23/2012 Document Reviewed: 07/24/2012 Elsevier Interactive Patient Education 2016 Elsevier Inc.  

## 2015-10-22 NOTE — Progress Notes (Signed)
   Subjective: Patient here for IUD change.  Has been in for 5 years and due to be changed. She has no cycles and desires to continue IUD for contraception.  Objective: Patient identified, informed consent performed, signed copy in chart, time out was performed  Procedure: Speculum placed inside vagina.  Cervix visualized.  Strings grasped with ring forceps.  IUD removed intact.  Procedure: Cervix visualized.  Cleaned with Betadine x 2.  Grasped anteriourly with a single tooth tenaculum.  Uterus sounded to 8 cm.  Liletta IUD placed per manufacturer's recommendations.  Strings trimmed to 3 cm.   Patient given post procedure instructions and Liletta care card with expiration date.  Patient is asked to check IUD strings periodically and follow up in 4-6 weeks for IUD check.  Impression: Encounter for IUD removal and reinsertion   Plan: Continue IUD for 5 years. Return in about 4 weeks (around 11/19/2015) for iud check.

## 2015-11-06 ENCOUNTER — Encounter: Payer: Self-pay | Admitting: Family Medicine

## 2015-11-06 ENCOUNTER — Ambulatory Visit (INDEPENDENT_AMBULATORY_CARE_PROVIDER_SITE_OTHER): Payer: BLUE CROSS/BLUE SHIELD | Admitting: Family Medicine

## 2015-11-06 DIAGNOSIS — F32A Depression, unspecified: Secondary | ICD-10-CM | POA: Insufficient documentation

## 2015-11-06 DIAGNOSIS — G4700 Insomnia, unspecified: Secondary | ICD-10-CM

## 2015-11-06 DIAGNOSIS — G43109 Migraine with aura, not intractable, without status migrainosus: Secondary | ICD-10-CM | POA: Diagnosis not present

## 2015-11-06 DIAGNOSIS — F5104 Psychophysiologic insomnia: Secondary | ICD-10-CM

## 2015-11-06 DIAGNOSIS — F419 Anxiety disorder, unspecified: Secondary | ICD-10-CM | POA: Diagnosis not present

## 2015-11-06 MED ORDER — ZOLPIDEM TARTRATE ER 6.25 MG PO TBCR: 6.2500 mg | EXTENDED_RELEASE_TABLET | Freq: Every evening | ORAL | 0 refills | Status: DC | PRN

## 2015-11-06 NOTE — Assessment & Plan Note (Signed)
Related to recent life stressors. She is already on nortriptyline and Cymbalta which could be of some benefit for this. Offered support and offered contact information for therapist though she declined at this time. She'll continue to monitor and if worsens she'll let us know.

## 2015-11-06 NOTE — Assessment & Plan Note (Signed)
Stable. She'll continue with neurology.

## 2015-11-06 NOTE — Patient Instructions (Signed)
Nice to see you. We will try extended-release Ambien for your sleep. Please continue to follow with neurology for your migraines. Please monitor for worsening of her anxiety and if you would like to speak with somebody regarding this please let us know. Please get the labs that we ordered at her last visit.

## 2015-11-06 NOTE — Assessment & Plan Note (Signed)
Chronic issue. Unchanged with 5 mg of Ambien. We'll try Ambien continuous release. If no improvement would consider other treatments.

## 2015-11-06 NOTE — Progress Notes (Signed)
Pre visit review using our clinic review tool, if applicable. No additional management support is needed unless otherwise documented below in the visit note. 

## 2015-11-06 NOTE — Progress Notes (Signed)
  Tommi Rumps, MD Phone: [phone]  Margaret Silva is a 35 y.o. female who presents today for f/u.  Insomnia: patient notes this is not appreciably better. Sometimes she wakes up several times a night and other times she can't fall asleep. Some nights she sleeps well if she is particularly exhausted from her day. Notes the Ambien sometimes helps. Has been on Lunesta previously with little benefit. She thinks this is her baseline insomnia. She does not believe this is related to anxiety.  Anxiety: Patient notes she has felt quite anxious recently given that her mother was recently diagnosed with breast cancer and one of her friend's daughters is currently hospitalized. She is able to talk to her mother and her friend regarding these things. She notes no depression.  Migraines: Followed by neurology. Currently on Depakote, Imitrex, and Zomig. Gets migraines once a week. Typically frontal in nature. Gets photophobia and phonophobia with them. Notes her migraines are much better controlled than previously. No numbness or weakness with this.  PMH: nonsmoker.   ROS see history of present illness  Objective  Physical Exam Vitals:   11/06/15 1004  BP: 104/66  Pulse: 97  Temp: 98.3 F (36.8 C)    BP Readings from Last 3 Encounters:  11/06/15 104/66  09/30/15 112/76  08/12/15 115/78   Wt Readings from Last 3 Encounters:  11/06/15 190 lb 12.8 oz (86.5 kg)  09/30/15 190 lb 3.2 oz (86.3 kg)  08/12/15 178 lb (80.7 kg)    Physical Exam  Constitutional: No distress.  HENT:  Mouth/Throat: Oropharynx is clear and moist. No oropharyngeal exudate.  Eyes: Conjunctivae are normal. Pupils are equal, round, and reactive to light.  Cardiovascular: Normal rate, regular rhythm and normal heart sounds.   Pulmonary/Chest: Effort normal and breath sounds normal.  Neurological: She is alert.  CN 2-12 intact, 5/5 strength in bilateral biceps, triceps, grip, quads, hamstrings, plantar and  dorsiflexion, sensation to light touch intact in bilateral UE and LE, normal gait, 2+ patellar reflexes  Skin: Skin is warm and dry. She is not diaphoretic.  Psychiatric:  Mood anxious, affect anxious     Assessment/Plan: Please see individual problem list.  Migraine with aura and without status migrainosus, not intractable Stable. She'll continue with neurology.  Chronic insomnia Chronic issue. Unchanged with 5 mg of Ambien. We'll try Ambien continuous release. If no improvement would consider other treatments.  Anxiety Related to recent life stressors. She is already on nortriptyline and Cymbalta which could be of some benefit for this. Offered support and offered contact information for therapist though she declined at this time. She'll continue to monitor and if worsens she'll let us know.   No orders of the defined types were placed in this encounter.   Meds ordered this encounter  Medications  . zolpidem (AMBIEN CR) 6.25 MG CR tablet    Sig: Take 1 tablet (6.25 mg total) by mouth at bedtime as needed for sleep.    Dispense:  14 tablet    Refill:  0    Patient has an appointment with GI on 11/11/15. She does not remember what time. I advised her to stop and check with our referral coordinator.   Tommi Rumps, MD [place]

## 2015-11-18 ENCOUNTER — Telehealth: Payer: Self-pay | Admitting: Neurology

## 2015-11-18 ENCOUNTER — Encounter: Payer: Self-pay | Admitting: Obstetrics and Gynecology

## 2015-11-18 ENCOUNTER — Ambulatory Visit (INDEPENDENT_AMBULATORY_CARE_PROVIDER_SITE_OTHER): Payer: BLUE CROSS/BLUE SHIELD | Admitting: Obstetrics and Gynecology

## 2015-11-18 VITALS — BP 130/85 | HR 83 | Wt 190.4 lb

## 2015-11-18 DIAGNOSIS — Z30431 Encounter for routine checking of intrauterine contraceptive device: Secondary | ICD-10-CM | POA: Diagnosis not present

## 2015-11-18 NOTE — Progress Notes (Signed)
35 yo here for IUD check. Patient has had the IUD re-inserted on 9/5. She reports doing well since IUD insertion. She denies any abnormal vaginal bleeding or pelvic pain. She has been sexually active without complaints  Past Medical History:  Diagnosis Date  . Asthma   . Bursitis of left hip   . Diverticulitis    h/o  . Endometriosis   . GERD (gastroesophageal reflux disease)   . Headache   . History of chicken pox   . History of colonoscopy 11/04 and April 06  . History of laparoscopy 12/04 and 12/07  . History of laparoscopy 01/29/2006   Dr. Georga Bora Endometriosis  . Hx of migraines   . Hx: UTI (urinary tract infection)   . Interstitial cystitis 01/03/2004   hydrodistilation  . Jaundice    h/o  . Plantar fasciitis, bilateral   . 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 CYSTOSCOPY WITH HYDRODISTENSION AND BIOPSY    . DIAGNOSTIC LAPAROSCOPY  02/16/2003  . SIGMOIDOSCOPY  05/26/10   Family History  Problem Relation Age of Onset  . Diabetes Maternal Grandmother   . Arthritis Maternal Grandmother   . Kidney disease Maternal Grandmother   . Hypertension Father   . Hyperlipidemia Father   . Fibromyalgia Mother   . Migraines Mother   . Cancer Mother 64    breast  . Ehlers-Danlos syndrome Sister   . Fibromyalgia Sister   . Heart attack Paternal Grandfather   . Cancer Paternal Grandfather     Pancreatic/ Bladder Cancer/prostate  . Arthritis Paternal Grandfather   . Heart disease Paternal Grandfather   . Cancer Paternal Grandmother     Liver cancer/lung cancer  . Diabetes Paternal Grandmother   . Heart attack Maternal Grandfather   . Heart disease Maternal Grandfather   . Hypertension Maternal Grandfather   . Mental illness Maternal Grandfather    Social History  Substance Use Topics  . Smoking status: Never Smoker  . Smokeless tobacco: Never Used  . Alcohol use 0.0 oz/week   Comment: occasionally   ROS See pertinent in HPI  Blood pressure 130/85, pulse 83, weight 190 lb 6.4 oz (86.4 kg). GENERAL: Well-developed, well-nourished female in no acute distress.  ABDOMEN: Soft, nontender, nondistended. No organomegaly. PELVIC: Normal external female genitalia. Vagina is pink and rugated.  Normal discharge. Normal appearing cervix with IUD strings extending 3 cm from the os. Uterus is normal in size. No adnexal mass or tenderness. EXTREMITIES: No cyanosis, clubbing, or edema, 2+ distal pulses.  A/P 35 yo here for IUD check - IUD continues to be in the appropriate location - RTC for annual exam

## 2015-11-18 NOTE — Telephone Encounter (Signed)
Patient called to inquire if [employer] [place] is going to cover BOTOX. Please call to advise [phone].

## 2015-11-20 NOTE — Telephone Encounter (Signed)
Pt called in to see if her insurance will be covering her botox. Please call pt. Her appt is Thursday. [phone]

## 2015-11-21 ENCOUNTER — Ambulatory Visit: Payer: BLUE CROSS/BLUE SHIELD | Admitting: Neurology

## 2015-11-21 ENCOUNTER — Ambulatory Visit (INDEPENDENT_AMBULATORY_CARE_PROVIDER_SITE_OTHER): Payer: BLUE CROSS/BLUE SHIELD | Admitting: Neurology

## 2015-11-21 VITALS — BP 134/79 | HR 109 | Ht 64.0 in

## 2015-11-21 DIAGNOSIS — G43709 Chronic migraine without aura, not intractable, without status migrainosus: Secondary | ICD-10-CM

## 2015-11-21 NOTE — Progress Notes (Signed)
Botox-100unitsx2 vial Lot: [id number] Expiration: 06/2017 NDC: [id number]  Lot: [id number] Expiration: 02/2018 NDC: [id number]  0.9% Sodium Chloride- 62mL total [id number] Expiration: 06/2017 NDC: [id number]  BUY AND BILL

## 2015-11-21 NOTE — Progress Notes (Signed)

## 2015-11-22 ENCOUNTER — Telehealth: Payer: Self-pay | Admitting: Neurology

## 2015-11-22 NOTE — Telephone Encounter (Signed)
I called pt.   She stated that she had reaction to the botox that she had yesterday morning around 0930.  In the afternoon, she started to cough- clearing throat, tightness in chest -sob (feeling like she had to use her inhaler) which she used twice, sore throat.  She is much better to day, still has cough to clear throat, some sorethroat, itcy throat.  Pulse today in 80's.   If level 10 yesterday, today is level 6-7.   Would forward message to Dr. Jaynee Eagles who is out of office today, and also to [facility].

## 2015-11-22 NOTE — Telephone Encounter (Signed)
Sorry, I had left the office by the time I was sent this message.  Agree with dr Rhea Belton advise.

## 2015-11-22 NOTE — Telephone Encounter (Signed)
Spoke to Dr. Krista Blue.  She relayed that most likely not related to botox but to other issues (allergy/seasonal/envirmontal).  I LMVM for pt and  relayed that if she got worse to seek medical care at urgent care or ED dealing with respiratory problems.  Will touch base next week.

## 2015-11-22 NOTE — Telephone Encounter (Signed)
Pt called in to give results of botox- side effect difficulty swallowing, shortness of breath, sore throat, pulse in the 100's , cough. By the afternoon she started to experience the side effects and are starting to get better.  Please call and advise [phone]

## 2015-11-25 ENCOUNTER — Other Ambulatory Visit: Payer: Self-pay

## 2015-11-25 NOTE — Telephone Encounter (Signed)
Emma, I don;t think this was due to botox, please let her know thanks

## 2015-11-25 NOTE — Telephone Encounter (Signed)
Sent message to [facility] per Dr Jaynee Eagles request to not order any more botox. Pt declined further botox at this time.

## 2015-11-25 NOTE — Telephone Encounter (Signed)
Dr Jaynee Eagles- FYI  Called and spoke to pt. She stated she started feeling sick after she had botox that day. She thinks it was "definelty" the botox. She stated her sister also reacted the same way when she first had the botox. She does not want to have any more botox. Advised I will relay message to Dr Jaynee Eagles and let her know. She stated she went to walk in clinic and they gave her prednisone for upper respiratory infection.

## 2015-11-26 ENCOUNTER — Other Ambulatory Visit: Payer: Self-pay | Admitting: Family Medicine

## 2015-11-26 NOTE — Telephone Encounter (Signed)
Last filled 11/06/15. Last seen 11/06/15.

## 2015-11-26 NOTE — Telephone Encounter (Signed)
Noted. Cancelled next apt.

## 2015-11-27 NOTE — Telephone Encounter (Signed)
RX faxed

## 2015-11-27 NOTE — Telephone Encounter (Signed)
Refill given. Please fax to pharmacy.

## 2015-12-02 NOTE — Telephone Encounter (Signed)
Patient called, states she did BOTOX on October 5th and was advised to call back if eye drooped. Patient states her right eye drooped on Friday and more noticeable on Saturday and still drooping as of today.

## 2015-12-02 NOTE — Telephone Encounter (Signed)
Called and spoke to pt. Advised this can be a side effect of medication. Can take about 2-3 months to wear off. Offered to send message to [facility]. She declined. Stating she was just calling as FYI. Advised Dr Jaynee Eagles out of office this week. Will send to Dr Jaynee Eagles as Juluis Rainier. She verbalized understanding.

## 2015-12-10 ENCOUNTER — Ambulatory Visit: Payer: BLUE CROSS/BLUE SHIELD | Admitting: Neurology

## 2015-12-12 ENCOUNTER — Other Ambulatory Visit: Payer: Self-pay | Admitting: *Deleted

## 2015-12-12 DIAGNOSIS — G43709 Chronic migraine without aura, not intractable, without status migrainosus: Secondary | ICD-10-CM

## 2015-12-12 DIAGNOSIS — G43909 Migraine, unspecified, not intractable, without status migrainosus: Secondary | ICD-10-CM

## 2015-12-12 MED ORDER — SUMATRIPTAN SUCCINATE 11 MG/NOSEPC NA EXHP: 1.0000 | INHALANT_POWDER | Freq: Once | NASAL | 11 refills | Status: DC

## 2015-12-13 ENCOUNTER — Encounter: Payer: Self-pay | Admitting: *Deleted

## 2015-12-13 NOTE — Progress Notes (Signed)
Faxed onzetra enrollment form to [facility] [employer]). Fax: [phone]. Received confirmation.  Included: office note, rx onzetra.

## 2015-12-17 ENCOUNTER — Telehealth: Payer: Self-pay | Admitting: Neurology

## 2015-12-17 NOTE — Telephone Encounter (Signed)
She needs to be educated on the botox copay program for the bill. Andee Poles can you call her? thanks

## 2015-12-17 NOTE — Telephone Encounter (Signed)
Pt called to advise she rec'd a bill for botox for 11/21/15. She said Dr Jaynee Eagles advised to let her know if she rec'd a bil.Marland Kitchen

## 2015-12-17 NOTE — Telephone Encounter (Signed)
Dr Jaynee Eagles- see message below

## 2015-12-18 NOTE — Telephone Encounter (Signed)
Spoke with Danielle yesterday. She will call pt to discuss copay card program for botox.

## 2015-12-20 NOTE — Telephone Encounter (Signed)
Called the patient to go over bill for her botox injection. She stated that "Dr Jaynee Eagles told me the first two injections would be free" I asked her if she was referring to the savings card through [place] and she did not know. She asked me for her login to my chart and I told her it would be a a login and password she had set up for herself. She stated that she hadnt created one yet but I informed her that epic was stating she had. She said that it was possible she forgot it and she would try to remember. I told her that I would look into the coverage of botox Dr. Jaynee Eagles was talking about and I would call her back.

## 2016-01-17 ENCOUNTER — Other Ambulatory Visit: Payer: Self-pay | Admitting: Family Medicine

## 2016-02-05 ENCOUNTER — Encounter: Payer: Self-pay | Admitting: *Deleted

## 2016-02-05 ENCOUNTER — Encounter: Payer: Self-pay | Admitting: Family Medicine

## 2016-02-05 ENCOUNTER — Ambulatory Visit (INDEPENDENT_AMBULATORY_CARE_PROVIDER_SITE_OTHER): Payer: BLUE CROSS/BLUE SHIELD | Admitting: Family Medicine

## 2016-02-05 ENCOUNTER — Telehealth: Payer: Self-pay

## 2016-02-05 VITALS — BP 116/60 | HR 101 | Temp 98.3°F | Ht 64.0 in | Wt 194.2 lb

## 2016-02-05 DIAGNOSIS — Z7184 Encounter for health counseling related to travel: Secondary | ICD-10-CM | POA: Insufficient documentation

## 2016-02-05 DIAGNOSIS — Z789 Other specified health status: Secondary | ICD-10-CM

## 2016-02-05 DIAGNOSIS — Z7189 Other specified counseling: Secondary | ICD-10-CM

## 2016-02-05 DIAGNOSIS — Z283 Underimmunization status: Secondary | ICD-10-CM

## 2016-02-05 DIAGNOSIS — F5104 Psychophysiologic insomnia: Secondary | ICD-10-CM

## 2016-02-05 DIAGNOSIS — Z2839 Other underimmunization status: Secondary | ICD-10-CM

## 2016-02-05 DIAGNOSIS — R0982 Postnasal drip: Secondary | ICD-10-CM

## 2016-02-05 DIAGNOSIS — F419 Anxiety disorder, unspecified: Secondary | ICD-10-CM | POA: Diagnosis not present

## 2016-02-05 DIAGNOSIS — J309 Allergic rhinitis, unspecified: Secondary | ICD-10-CM | POA: Diagnosis not present

## 2016-02-05 MED ORDER — LORATADINE 10 MG PO TABS: 10.0000 mg | ORAL_TABLET | Freq: Every day | ORAL | 3 refills | Status: DC

## 2016-02-05 MED ORDER — ZOLPIDEM TARTRATE ER 6.25 MG PO TBCR: 6.2500 mg | EXTENDED_RELEASE_TABLET | Freq: Every evening | ORAL | 2 refills | Status: DC | PRN

## 2016-02-05 NOTE — Assessment & Plan Note (Signed)
Chronic issue. Discussed that the Ambien dose she is on is the maximum dose for females. She'll continue on this at this time. Given refill.

## 2016-02-05 NOTE — Patient Instructions (Signed)
Nice to see you. Please contact the health department regarding travel vaccinations. I refilled your Ambien. Please monitor your anxiety and if it gets worse please let us know. I sent in Claritin for you to take.

## 2016-02-05 NOTE — Telephone Encounter (Signed)
Called patient to schedule lab appointment. Patient was here for office visit and left without lab.

## 2016-02-05 NOTE — Assessment & Plan Note (Signed)
Has improved somewhat. Still some issues with this. She would like to continue to monitor currently. If worsen she'll let us know.

## 2016-02-05 NOTE — Progress Notes (Signed)
  Tommi Rumps, MD Phone: [phone]  Margaret Silva is a 35 y.o. female who presents today for follow-up.  Patient reports she is going on a medical mission trip in January. She wanted to know what vaccinations she needed. Has previously gone on this and received yellow fever and typhoid vaccinations.  Insomnia: Taking Ambien. It takes her several hours to fall asleep and then sleeps for about 5 hours. Ambien does appear to be beneficial though she wonders about going up on the dose.  Anxiety is somewhat better. Work has added to some of her anxiety. No depression. Currently on Cymbalta. This has been somewhat beneficial.  Allergic rhinitis: Taking Claritin. Has gotten allergy shots in the past. Notes her eyes swell, she gets rhinorrhea, sneezing, and postnasal drip. Claritin is beneficial.  PMH: nonsmoker.   ROS see history of present illness  Objective  Physical Exam Vitals:   02/05/16 1430  BP: 116/60  Pulse: (!) 101  Temp: 98.3 F (36.8 C)    BP Readings from Last 3 Encounters:  02/05/16 116/60  11/21/15 134/79  11/18/15 130/85   Wt Readings from Last 3 Encounters:  02/05/16 194 lb 3.2 oz (88.1 kg)  11/18/15 190 lb 6.4 oz (86.4 kg)  11/06/15 190 lb 12.8 oz (86.5 kg)    Physical Exam  Constitutional: No distress.  Cardiovascular: Normal rate, regular rhythm and normal heart sounds.   Pulmonary/Chest: Effort normal and breath sounds normal.  Neurological: She is alert. Gait normal.  Skin: Skin is warm and dry. She is not diaphoretic.  Psychiatric:  Mood anxious, affect normal     Assessment/Plan: Please see individual problem list.  Allergic rhinitis with postnasal drip Intermittently has issues with this. We will send in a prescription for Claritin. Continue to monitor. If worsen she'll let us know.  Anxiety Has improved somewhat. Still some issues with this. She would like to continue to monitor currently. If worsen she'll let us  know.  Chronic insomnia Chronic issue. Discussed that the Ambien dose she is on is the maximum dose for females. She'll continue on this at this time. Given refill.  Travel advice encounter Discussed patient needs to contact the health Department to determine if he vaccinations she needs to travel to [place] and to discuss any other precautions. It does appear that she did not complete the hepatitis A and hepatitis B vaccination series thus we will check titers.   Orders Placed This Encounter  Procedures  . Hepatitis A Ab, Total  . Hepatitis B surface antibody    Meds ordered this encounter  Medications  . zolpidem (AMBIEN CR) 6.25 MG CR tablet    Sig: Take 1 tablet (6.25 mg total) by mouth at bedtime as needed. for sleep    Dispense:  30 tablet    Refill:  2    Not to exceed 5 additional fills before 05/04/2016.  Marland Kitchen loratadine (CLARITIN) 10 MG tablet    Sig: Take 1 tablet (10 mg total) by mouth daily.    Dispense:  90 tablet    Refill:  [place], MD [place]

## 2016-02-05 NOTE — Progress Notes (Signed)
Pre visit review using our clinic review tool, if applicable. No additional management support is needed unless otherwise documented below in the visit note. 

## 2016-02-05 NOTE — Assessment & Plan Note (Signed)
Intermittently has issues with this. We will send in a prescription for Claritin. Continue to monitor. If worsen she'll let us know.

## 2016-02-05 NOTE — Telephone Encounter (Signed)
Patient states she has them done at work

## 2016-02-05 NOTE — Assessment & Plan Note (Signed)
Discussed patient needs to contact the health Department to determine if he vaccinations she needs to travel to [place] and to discuss any other precautions. It does appear that she did not complete the hepatitis A and hepatitis B vaccination series thus we will check titers.

## 2016-02-05 NOTE — Progress Notes (Signed)
Faxed dx clarification back to onzetra: Rebecca Eaton to include [phone] with [phone]. Fax: [phone]. Received confirmation.

## 2016-02-13 NOTE — Progress Notes (Signed)
Received fax notification from [facility] that onzetra xsail 11mg  PA approvedx1 year.

## 2016-02-14 ENCOUNTER — Telehealth: Payer: Self-pay | Admitting: Neurology

## 2016-02-14 ENCOUNTER — Other Ambulatory Visit: Payer: Self-pay | Admitting: Neurology

## 2016-02-14 NOTE — Telephone Encounter (Signed)
Spoke with patient and informed her that Terrence Dupont RN had documented 02/05/16 that Dan [place] PA approved x 1 year.  Unable to find PA # or phone # to give to patient. Informed her that Terrence Dupont is not in office today.  Advised she call the person she had previously spoken to and inform them of Emma's note. She verbalized understanding, appreciation.

## 2016-02-14 NOTE — Telephone Encounter (Signed)
Pt called said she spoke with [employer] program and was advised they were unable to speak with the provider. She said they are trying to approve SUMAtriptan Succinate (ONZETRA XSAIL) 11 MG/NOSEPC EXHP(Expired) and was told if they did not hear back from provider today the case would be closed.  She has only had samples. Pt is aware the office closes at noon and reopens on Wednesday 02/19/16

## 2016-02-19 NOTE — Telephone Encounter (Signed)
Annie/[facility] [phone] returned RN's call

## 2016-02-19 NOTE — Telephone Encounter (Signed)
Called [place] back. She stated the pharmacy was having trouble running the prescription through stating pt needed generic. She submitted form to pharmacy saying there is no generic version. Pharmacy still having problems so they called on their end to fix the issue. Nothing further needed at this time. It was worked out per [employer]. She also LVM for pt relaying this information.

## 2016-02-19 NOTE — Telephone Encounter (Signed)
Called and LVM for Margaret Silva with advanced patient services w/ [employer]. Relayed that I recevied fax that pt onzetra approved for 1 yr per [facility] fax that I received. Wanted to verify this information. Gave her [place] phone number for call back and pt name/DOB.  Advised AA,MD and I were out of the office this past Friday.

## 2016-02-27 ENCOUNTER — Ambulatory Visit: Payer: BLUE CROSS/BLUE SHIELD | Admitting: Neurology

## 2016-03-27 NOTE — Telephone Encounter (Signed)
I met with the patient in person today to go over her bill. She stated to me again that she was told her first two injections would be free. She showed me an add from [employer] that said "first two injections free". I explained that this was through [place] that makes botox and asked her if she had signed up. She told me that she had not signed up because she thought this was something our office handled. I told her that we could not do it for her and she would have to sign up herself. Patient stated that no one told her to sign up for this, I asked her to see the card and directed her to the part of the card that said "sign up at [email or web address]" I told her she would need to go to this site and sign up and then submit her EOB and receipt from the specialty pharmacy. She stated "If I would have known all of this I would not have done it". I asked what she was referring to and she stated that she had an awful reaction to the injections, stating she had eye droop, swollen throat, and "every symptom listed". I apologized for her experience and told her that if she had any questions while she was signing up for the patient assistance she could call me back.

## 2016-03-27 NOTE — Telephone Encounter (Signed)
Dr Jaynee EaglesAndee Poles was going to send this to you. Got routed to [place] and me by accident

## 2016-03-27 NOTE — Telephone Encounter (Signed)
I never told patient the first 2 injections are free or that any are free. Thanks

## 2016-04-02 NOTE — Telephone Encounter (Signed)
Thank you :)

## 2016-04-02 NOTE — Telephone Encounter (Signed)
Dr. Jaynee Eagles I voiced to her that you would never say that and told her that the add she had was directly from [employer], not our office and it was also dependent upon the fact that sign up for the program, which she had not.

## 2016-04-14 ENCOUNTER — Other Ambulatory Visit: Payer: Self-pay | Admitting: Family Medicine

## 2016-04-30 LAB — BASIC METABOLIC PANEL
BUN: 15 mg/dL (ref 4–21)
CREATININE: 0.8 mg/dL (ref 0.5–1.1)
Glucose: 100 mg/dL
POTASSIUM: 4.6 mmol/L (ref 3.4–5.3)
SODIUM: 137 mmol/L (ref 137–147)

## 2016-04-30 LAB — TSH: TSH: 3.08 u[IU]/mL (ref 0.41–5.90)

## 2016-04-30 LAB — VITAMIN D 25 HYDROXY (VIT D DEFICIENCY, FRACTURES): Vit D, 25-Hydroxy: 33.4

## 2016-04-30 LAB — LIPID PANEL
CHOLESTEROL: 207 mg/dL — AB (ref 0–200)
HDL: 34 mg/dL — AB (ref 35–70)
LDL Cholesterol: 145 mg/dL
Triglycerides: 139 mg/dL (ref 40–160)

## 2016-04-30 LAB — HEMOGLOBIN A1C: HEMOGLOBIN A1C: 5.5

## 2016-04-30 LAB — CBC AND DIFFERENTIAL
HEMATOCRIT: 42 % (ref 36–46)
HEMOGLOBIN: 13.7 g/dL (ref 12.0–16.0)
Platelets: 360 10*3/uL (ref 150–399)
WBC: 5.6 10^3/mL

## 2016-04-30 LAB — HEPATIC FUNCTION PANEL
ALT: 17 U/L (ref 7–35)
AST: 14 U/L (ref 13–35)
Alkaline Phosphatase: 88 U/L (ref 25–125)
Bilirubin, Total: 0.3 mg/dL

## 2016-05-05 ENCOUNTER — Encounter: Payer: Self-pay | Admitting: Family Medicine

## 2016-05-05 ENCOUNTER — Ambulatory Visit (INDEPENDENT_AMBULATORY_CARE_PROVIDER_SITE_OTHER): Payer: BLUE CROSS/BLUE SHIELD | Admitting: Family Medicine

## 2016-05-05 DIAGNOSIS — F5104 Psychophysiologic insomnia: Secondary | ICD-10-CM | POA: Diagnosis not present

## 2016-05-05 DIAGNOSIS — F419 Anxiety disorder, unspecified: Secondary | ICD-10-CM | POA: Diagnosis not present

## 2016-05-05 DIAGNOSIS — G43109 Migraine with aura, not intractable, without status migrainosus: Secondary | ICD-10-CM | POA: Diagnosis not present

## 2016-05-05 MED ORDER — ZOLPIDEM TARTRATE ER 6.25 MG PO TBCR: 6.2500 mg | EXTENDED_RELEASE_TABLET | Freq: Every evening | ORAL | 2 refills | Status: DC | PRN

## 2016-05-05 NOTE — Patient Instructions (Signed)
Nice to see you. We will refill your Ambien. Please consider whether or not you want to see a therapist. Please follow up with your neurologist. Good luck in finding a new job.

## 2016-05-05 NOTE — Progress Notes (Signed)
  Tommi Rumps, MD Phone: [phone]  Margaret Silva is a 36 y.o. female who presents today for follow-up.  Insomnia: Currently taking Ambien. This does help. Does not make her drowsy. She gets 7 hours of sleep a night. She's been quite tired she's been working excessively recently.  Her anxiety has been increased since she had a change at work. She is working 60 hours a week and has a lot of stress with this. She's been on Cymbalta and Elavil, and Depakote. No depression.  Migraines: Notes her headaches daily recently. She takes Imitrex and this does help some. They have worsened since she changed jobs at her current work facility. 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 Notes they're typically right and frontal. Photophobia and phonophobia are present. Occasionally her left shoulder will go numb with this this is a chronic issue. No vision changes. She takes Elavil and Depakote for her headaches.  PMH: nonsmoker.   ROS see history of present illness  Objective  Physical Exam Vitals:   05/05/16 1401  BP: 120/80  Pulse: 97  Temp: 98.3 F (36.8 C)    BP Readings from Last 3 Encounters:  05/05/16 120/80  02/05/16 116/60  11/21/15 134/79   Wt Readings from Last 3 Encounters:  05/05/16 198 lb (89.8 kg)  02/05/16 194 lb 3.2 oz (88.1 kg)  11/18/15 190 lb 6.4 oz (86.4 kg)    Physical Exam  Constitutional: No distress.  Cardiovascular: Normal rate, regular rhythm and normal heart sounds.   Pulmonary/Chest: Effort normal and breath sounds normal.  Neurological: She is alert.  CN 2-12 intact, 5/5 strength in bilateral biceps, triceps, grip, quads, hamstrings, plantar and dorsiflexion, sensation to light touch intact in bilateral UE and LE, normal gait  Skin: Skin is warm and dry. She is not diaphoretic.  Psychiatric:  Mood anxious, affect anxious     Assessment/Plan: Please see individual problem  list.  Migraine with aura and without status migrainosus, not intractable Continues to have migraines. It actually become more frequent recently with change at work. She is currently on Imitrex, Depakote, and Elavil for this. Discussed that she needs to schedule follow-up visit with her neurologist for further evaluation to see if there is anything else to do to help her headaches.  Anxiety Worsened somewhat given recent work changes. No depression. Offered referral to a therapist given that she is currently on several medications that should be helping this. Also discussed that may be changing jobs be beneficial and she has been working on this already. She declined therapy referral at this time. She'll continue to monitor.  Chronic insomnia Chronic issue. Stable. Tolerating Ambien. Refill given.   No orders of the defined types were placed in this encounter.   Meds ordered this encounter  Medications  . zolpidem (AMBIEN CR) 6.25 MG CR tablet    Sig: Take 1 tablet (6.25 mg total) by mouth at bedtime as needed. for sleep    Dispense:  30 tablet    Refill:  2    Not to exceed 5 additional fills before 05/04/2016.   Tommi Rumps, MD [place]

## 2016-05-05 NOTE — Progress Notes (Signed)
Pre visit review using our clinic review tool, if applicable. No additional management support is needed unless otherwise documented below in the visit note. 

## 2016-05-05 NOTE — Assessment & Plan Note (Signed)
Chronic issue. Stable. Tolerating Ambien. Refill given.

## 2016-05-05 NOTE — Assessment & Plan Note (Signed)
Continues to have migraines. It actually become more frequent recently with change at work. She is currently on Imitrex, Depakote, and Elavil for this. Discussed that she needs to schedule follow-up visit with her neurologist for further evaluation to see if there is anything else to do to help her headaches.

## 2016-05-05 NOTE — Assessment & Plan Note (Signed)
Worsened somewhat given recent work changes. No depression. Offered referral to a therapist given that she is currently on several medications that should be helping this. Also discussed that may be changing jobs be beneficial and she has been working on this already. She declined therapy referral at this time. She'll continue to monitor.

## 2016-05-07 ENCOUNTER — Encounter: Payer: Self-pay | Admitting: Family Medicine

## 2016-05-07 DIAGNOSIS — M7062 Trochanteric bursitis, left hip: Secondary | ICD-10-CM | POA: Insufficient documentation

## 2016-05-07 DIAGNOSIS — R5383 Other fatigue: Secondary | ICD-10-CM | POA: Insufficient documentation

## 2016-05-07 NOTE — Progress Notes (Signed)
Office Visit Note  Patient: Margaret Silva             Date of Birth: [birth year]           MRN: [id number]             PCP: Tommi Rumps, MD Referring: Leone Haven, MD Visit Date: 05/12/2016 Occupation: @GUAROCC @    Subjective:  Generalized pain   History of Present Illness: Margaret Silva is a 36 y.o. female history of fibromyalgia syndrome and osteoarthritis. Patient states that she's been under a lot of stress at work. There has been a change in her position since September 2017. She states she's been working 10 hours a day for 6 days a week. And she is in an environment with is not to smoke. She gets a lot of headaches and congestion . She been also experiencing increased pain all over. He is been having pain in her upper and thoracic region. She describes over the left trapezius area. She rates her pain on scale of 0-10 about 7-8 and fatigue at 8 as well. She still have some tightness in the left trochanteric area. She's been trying to do some stretches on regular basis. She continues to have discomfort from plantar fasciitis.  Activities of Daily Living:  Patient reports morning stiffness for 5 minutes.   Patient Reports nocturnal pain.  Difficulty dressing/grooming: Denies Difficulty climbing stairs: Denies Difficulty getting out of chair: Denies Difficulty using hands for taps, buttons, cutlery, and/or writing: Denies   Review of Systems  Constitutional: Positive for fatigue and weight gain. Negative for night sweats, weight loss and weakness.  HENT: Positive for mouth dryness. Negative for mouth sores, trouble swallowing, trouble swallowing and nose dryness.   Eyes: Negative for pain, redness, visual disturbance and dryness.  Respiratory: Negative for cough, shortness of breath and difficulty breathing.   Cardiovascular: Negative for chest pain, palpitations, hypertension, irregular heartbeat and swelling in legs/feet.  Gastrointestinal: Negative for  blood in stool, constipation and diarrhea.  Endocrine: Negative for increased urination.  Genitourinary: Negative for vaginal dryness.  Musculoskeletal: Positive for arthralgias, joint pain, myalgias, morning stiffness and myalgias. Negative for joint swelling, muscle weakness and muscle tenderness.  Skin: Negative for color change, rash, hair loss, skin tightness, ulcers and sensitivity to sunlight.  Allergic/Immunologic: Negative for susceptible to infections.  Neurological: Negative for dizziness, memory loss and night sweats.  Hematological: Negative for swollen glands.  Psychiatric/Behavioral: Positive for depressed mood. Negative for sleep disturbance. The patient is nervous/anxious.     PMFS History:  Patient Active Problem List   Diagnosis Date Noted  . Other fatigue 05/07/2016  . Trochanteric bursitis of left hip 05/07/2016  . Anxiety 11/06/2015  . GERD (gastroesophageal reflux disease) 09/30/2015  . Genital herpes 09/30/2015  . Hyperlipidemia LDL goal <130 07/25/2015  . IUD contraception 09/04/2014  . 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 Major depressive disorder in remission 08/08/2014  . Chronic insomnia 08/08/2014  . Allergic rhinitis with postnasal drip 01/26/2014  . Tinea versicolor 03/02/2013  . POTS (postural orthostatic tachycardia syndrome) 12/03/2010  . Endometriosis 12/03/2010  . Fibromyalgia 12/03/2010  . Restless leg syndrome 12/03/2010  . 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 IBS (irritable bowel syndrome) 12/03/2010  . Migraine with aura and without status migrainosus, not intractable 12/03/2010    Past Medical History:  Diagnosis Date  . Asthma   . Bursitis of left hip   . Diverticulitis    h/o  . Endometriosis   .  GERD (gastroesophageal reflux disease)   . Headache   . History of chicken pox   . History of colonoscopy 11/04 and April 06  . History of laparoscopy 12/04 and 12/07  . History of laparoscopy 01/29/2006   Dr.  Georga Bora Endometriosis  . Hx of migraines   . Hx: UTI (urinary tract infection)   . Interstitial cystitis 01/03/2004   hydrodistilation  . Jaundice    h/o  . Plantar fasciitis, bilateral   . 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 Diabetes Maternal Grandmother   . Arthritis Maternal Grandmother   . Kidney disease Maternal Grandmother   . Hypertension Father   . Hyperlipidemia Father   . Fibromyalgia Mother   . Migraines Mother   . Cancer Mother 50    breast  . Ehlers-Danlos syndrome Sister   . Fibromyalgia Sister   . Heart attack Paternal Grandfather   . Cancer Paternal Grandfather     Pancreatic/ Bladder Cancer/prostate  . Arthritis Paternal Grandfather   . Heart disease Paternal Grandfather   . Cancer Paternal Grandmother     Liver cancer/lung cancer  . Diabetes Paternal Grandmother   . Heart attack Maternal Grandfather   . Heart disease Maternal Grandfather   . Hypertension Maternal Grandfather   . Mental illness Maternal Grandfather    Past Surgical History:  Procedure Laterality Date  . CYSTOSCOPY WITH HYDRODISTENSION AND BIOPSY    . DIAGNOSTIC LAPAROSCOPY  02/16/2003  . SIGMOIDOSCOPY  05/26/10   Social History   Social History Narrative   Lives with dog   Caffeine use:  Every week      Objective: Vital Signs: BP 124/74   Pulse 78   Wt 193 lb (87.5 kg)   BMI 33.13 kg/m    Physical Exam  Constitutional: She is oriented to person, place, and time. She appears well-developed and well-nourished.  HENT:  Head: Normocephalic and atraumatic.  Eyes: Conjunctivae and EOM are normal.  Neck: Normal range of motion.  Cardiovascular: Normal rate, regular rhythm, normal heart sounds and intact distal pulses.   Pulmonary/Chest: Effort normal and breath sounds normal.  Abdominal: Soft. Bowel sounds are normal.  Lymphadenopathy:    She has no cervical adenopathy.  Neurological:  She is alert and oriented to person, place, and time.  Skin: Skin is warm and dry. Capillary refill takes less than 2 seconds.  Psychiatric: She has a normal mood and affect. Her behavior is normal.  Nursing note and vitals reviewed.    Musculoskeletal Exam: C-spine and thoracic lumbar spine good range of motion she had spasm and pain in bilateral trapezius area. Shoulder joints elbow joints wrist joint MCPs PIPs DIPs with good range of motion with no synovitis hip joints knee joints ankles MTPs PIPs DIPs with good range of motion with no synovitis.  CDAI Exam: No CDAI exam completed.    Investigation: Findings:  01/2007 Her CBC with diff, sed rate, comprehensive metabolic panel, CK, TSH, and ANA were all negative.       Imaging: No results found.  Speciality Comments: No specialty comments available.    Procedures:  Trigger Point Inj Date/Time: 05/12/2016 4:07 PM Performed by: Bo Merino Authorized by: Bo Merino   Consent Given by:  Patient Site marked: the procedure site was marked   Timeout: prior to procedure the correct patient, procedure, and site was verified   Indications:  Muscle spasm  and pain Total # of Trigger Points:  2 Location: neck   Needle Size:  27 G Approach:  Dorsal Medications #1:  1 mL lidocaine 1 %; 20 mg triamcinolone acetonide 40 MG/ML Patient tolerance:  Patient tolerated the procedure well with no immediate complications   Allergies: Coffee bean extract  [coffea arabica]; Other; and Trazodone and nefazodone   Assessment / Plan:     Visit Diagnoses: Fibromyalgia: She's having a flare with increased pain, discomfort and fatigue. Her job has been very stressful she's working 60 hours per week. I've given her a work note that she should not be working more than 45 hours a week.  Other fatigue: Due to work overload.  Neck pain: She had bilateral trapezius is spasm after different treatment options were discussed and side effects  were reviewed she had trigger point injections as described above.  Trochanteric bursitis of left hip: She will continue to do stretching exercises.  Primary osteoarthritis of both knees: She has some chronic pain weight loss diet and exercise was discussed.  Plantar fasciitis: Proper fitting shoes were discussed.  Restless leg syndrome  POTS (postural orthostatic tachycardia syndrome)  Tinea versicolor  Chronic interstitial cystitis without hematuria  Recurrent major depressive disorder, in remission ([place])  History of migraine  Other irritable bowel syndrome    Orders: Orders Placed This Encounter  Procedures  . Trigger Point Injection   No orders of the defined types were placed in this encounter.   Face-to-face time spent with patient was 30 minutes. 50% of time was spent in counseling and coordination of care.  Follow-Up Instructions: Return in about 6 months (around 11/12/2016) for FMS. Follow-up with Glendale Chard, MD  Note - This record has been created using Dragon software.  Chart creation errors have been sought, but may not always  have been located. Such creation errors do not reflect on  the standard of medical care.

## 2016-05-12 ENCOUNTER — Encounter: Payer: Self-pay | Admitting: Rheumatology

## 2016-05-12 ENCOUNTER — Ambulatory Visit (INDEPENDENT_AMBULATORY_CARE_PROVIDER_SITE_OTHER): Payer: BLUE CROSS/BLUE SHIELD | Admitting: Rheumatology

## 2016-05-12 VITALS — BP 124/74 | HR 78 | Wt 193.0 lb

## 2016-05-12 DIAGNOSIS — Z8669 Personal history of other diseases of the nervous system and sense organs: Secondary | ICD-10-CM

## 2016-05-12 DIAGNOSIS — M170 Bilateral primary osteoarthritis of knee: Secondary | ICD-10-CM

## 2016-05-12 DIAGNOSIS — M542 Cervicalgia: Secondary | ICD-10-CM | POA: Diagnosis not present

## 2016-05-12 DIAGNOSIS — M7062 Trochanteric bursitis, left hip: Secondary | ICD-10-CM

## 2016-05-12 DIAGNOSIS — R000 Tachycardia, unspecified: Secondary | ICD-10-CM

## 2016-05-12 DIAGNOSIS — I498 Other specified cardiac arrhythmias: Secondary | ICD-10-CM

## 2016-05-12 DIAGNOSIS — M797 Fibromyalgia: Secondary | ICD-10-CM

## 2016-05-12 DIAGNOSIS — K588 Other irritable bowel syndrome: Secondary | ICD-10-CM

## 2016-05-12 DIAGNOSIS — N3010 Interstitial cystitis (chronic) without hematuria: Secondary | ICD-10-CM

## 2016-05-12 DIAGNOSIS — B360 Pityriasis versicolor: Secondary | ICD-10-CM

## 2016-05-12 DIAGNOSIS — I951 Orthostatic hypotension: Secondary | ICD-10-CM

## 2016-05-12 DIAGNOSIS — R5383 Other fatigue: Secondary | ICD-10-CM

## 2016-05-12 DIAGNOSIS — F3340 Major depressive disorder, recurrent, in remission, unspecified: Secondary | ICD-10-CM

## 2016-05-12 DIAGNOSIS — G2581 Restless legs syndrome: Secondary | ICD-10-CM

## 2016-05-12 DIAGNOSIS — M722 Plantar fascial fibromatosis: Secondary | ICD-10-CM

## 2016-05-12 DIAGNOSIS — G90A Postural orthostatic tachycardia syndrome (POTS): Secondary | ICD-10-CM

## 2016-05-12 MED ORDER — LIDOCAINE HCL 1 % IJ SOLN
1.0000 mL | INTRAMUSCULAR | Status: AC | PRN
  Administered 2016-05-12: 1 mL

## 2016-05-12 MED ORDER — TRIAMCINOLONE ACETONIDE 40 MG/ML IJ SUSP
20.0000 mg | INTRAMUSCULAR | Status: AC | PRN
Start: 2016-05-12 — End: 2016-05-12
  Administered 2016-05-12: 20 mg via INTRAMUSCULAR

## 2016-05-21 ENCOUNTER — Other Ambulatory Visit: Payer: Self-pay | Admitting: Neurology

## 2016-05-27 ENCOUNTER — Telehealth: Payer: Self-pay

## 2016-05-27 NOTE — Telephone Encounter (Signed)
Per Dr.Sonneberg. Informed patient her cholesterol is slightly elevated and she should work on diet and exercise.

## 2016-05-28 ENCOUNTER — Other Ambulatory Visit: Payer: Self-pay | Admitting: Family Medicine

## 2016-05-28 DIAGNOSIS — B009 Herpesviral infection, unspecified: Secondary | ICD-10-CM

## 2016-06-15 ENCOUNTER — Other Ambulatory Visit: Payer: Self-pay | Admitting: Neurology

## 2016-07-01 ENCOUNTER — Encounter: Payer: Self-pay | Admitting: *Deleted

## 2016-07-07 ENCOUNTER — Other Ambulatory Visit: Payer: Self-pay | Admitting: Neurology

## 2016-07-13 ENCOUNTER — Other Ambulatory Visit: Payer: Self-pay | Admitting: Family Medicine

## 2016-08-05 ENCOUNTER — Ambulatory Visit (INDEPENDENT_AMBULATORY_CARE_PROVIDER_SITE_OTHER): Payer: BLUE CROSS/BLUE SHIELD | Admitting: Family Medicine

## 2016-08-05 ENCOUNTER — Encounter: Payer: Self-pay | Admitting: Family Medicine

## 2016-08-05 VITALS — BP 120/78 | HR 90 | Temp 98.6°F | Wt 204.8 lb

## 2016-08-05 DIAGNOSIS — F419 Anxiety disorder, unspecified: Secondary | ICD-10-CM | POA: Diagnosis not present

## 2016-08-05 DIAGNOSIS — E785 Hyperlipidemia, unspecified: Secondary | ICD-10-CM

## 2016-08-05 DIAGNOSIS — F5104 Psychophysiologic insomnia: Secondary | ICD-10-CM

## 2016-08-05 DIAGNOSIS — M799 Soft tissue disorder, unspecified: Secondary | ICD-10-CM

## 2016-08-05 MED ORDER — ZOLPIDEM TARTRATE ER 6.25 MG PO TBCR: 6.2500 mg | EXTENDED_RELEASE_TABLET | Freq: Every evening | ORAL | 2 refills | Status: DC | PRN

## 2016-08-05 MED ORDER — DULOXETINE HCL 30 MG PO CPEP: 90.0000 mg | ORAL_CAPSULE | Freq: Every day | ORAL | 3 refills | Status: DC

## 2016-08-05 NOTE — Assessment & Plan Note (Signed)
Slight improvement. She'll continue to monitor.

## 2016-08-05 NOTE — Assessment & Plan Note (Signed)
Encourage diet and exercise. Given dietary handout.

## 2016-08-05 NOTE — Assessment & Plan Note (Signed)
Stable on Ambien.  Refill given. 

## 2016-08-05 NOTE — Assessment & Plan Note (Signed)
Soft tissue lesion of undetermined significance. Potentially could be a lipoma though doesn't necessarily have that specific consistency. We'll start with an ultrasound to determine if any further workup is needed after that.

## 2016-08-05 NOTE — Progress Notes (Signed)
  Tommi Rumps, MD Phone: [phone]  Margaret Silva is a 36 y.o. female who presents today for follow-up.  Insomnia: Patient notes this is stable. She's taking Ambien nightly. She gets about 8 hours of sleep. No drowsiness. She is not doing anything weird in the middle of night.  Anxiety: Patient notes this is doing pretty good right now. She is working less hours at work. Though it is still rough. She is on Cymbalta, Elavil, and Depakote for other reasons of these do help with her anxiety. No depression.  Hyperlipidemia: Not on any medications. Notes her diet is not good. She eats mostly meats and starches. There are no vegetables that she likes. She walks daily. No chest pain or claudication.  Patient reports a bump in her L quadriceps distally. Has been present for at least a month or so. No pain. Has not changed in size.  PMH: nonsmoker.   ROS see history of present illness  Objective  Physical Exam Vitals:   08/05/16 1432  BP: 120/78  Pulse: 90  Temp: 98.6 F (37 C)    BP Readings from Last 3 Encounters:  08/05/16 120/78  05/12/16 124/74  05/05/16 120/80   Wt Readings from Last 3 Encounters:  08/05/16 204 lb 12.8 oz (92.9 kg)  05/12/16 193 lb (87.5 kg)  05/05/16 198 lb (89.8 kg)    Physical Exam  Constitutional: No distress.  Cardiovascular: Normal rate, regular rhythm and normal heart sounds.   Pulmonary/Chest: Effort normal and breath sounds normal.  Musculoskeletal: She exhibits no edema.       Legs: Neurological: She is alert. Gait normal.  Skin: She is not diaphoretic.     Assessment/Plan: Please see individual problem list.  Hyperlipidemia LDL goal <130 Encourage diet and exercise. Given dietary handout.  Chronic insomnia Stable on Ambien. Refill given.  Anxiety Slight improvement. She'll continue to monitor.  Soft tissue lesion Soft tissue lesion of undetermined significance. Potentially could be a lipoma though doesn't  necessarily have that specific consistency. We'll start with an ultrasound to determine if any further workup is needed after that.   Orders Placed This Encounter  Procedures  . [place] Misc Soft Tissue    Standing Status:   Future    Standing Expiration Date:   10/05/2017    Order Specific Question:   Reason for Exam (SYMPTOM  OR DIAGNOSIS REQUIRED)    Answer:   soft tissue lesion in left distal quadriceps area just proximal to the knee    Order Specific Question:   Preferred imaging location?    Answer:   [facility] ordered this encounter  Medications  . zolpidem (AMBIEN CR) 6.25 MG CR tablet    Sig: Take 1 tablet (6.25 mg total) by mouth at bedtime as needed. for sleep    Dispense:  30 tablet    Refill:  2    Not to exceed 5 additional fills before 05/04/2016.  . DULoxetine (CYMBALTA) 30 MG capsule    Sig: Take 3 capsules (90 mg total) by mouth daily.    Dispense:  90 capsule    Refill:  [place], MD [place]

## 2016-08-05 NOTE — Patient Instructions (Addendum)
Nice to see you. I refilled your Ambien. Please work on diet and exercise. We'll get an ultrasound of the area of concern in your left thigh.

## 2016-08-11 ENCOUNTER — Other Ambulatory Visit: Payer: Self-pay | Admitting: Family Medicine

## 2016-08-11 DIAGNOSIS — M799 Soft tissue disorder, unspecified: Secondary | ICD-10-CM

## 2016-08-17 ENCOUNTER — Other Ambulatory Visit: Payer: Self-pay | Admitting: Neurology

## 2016-08-17 ENCOUNTER — Ambulatory Visit: Payer: BLUE CROSS/BLUE SHIELD

## 2016-08-17 DIAGNOSIS — G43009 Migraine without aura, not intractable, without status migrainosus: Secondary | ICD-10-CM

## 2016-08-28 ENCOUNTER — Encounter: Payer: Self-pay | Admitting: Rheumatology

## 2016-08-29 NOTE — Telephone Encounter (Signed)
Please, send her a work note that she can not work more than 45 hours a week with an end date of fu appointment.

## 2016-09-02 ENCOUNTER — Encounter: Payer: Self-pay | Admitting: *Deleted

## 2016-09-20 ENCOUNTER — Other Ambulatory Visit: Payer: Self-pay | Admitting: Neurology

## 2016-09-20 DIAGNOSIS — G43009 Migraine without aura, not intractable, without status migrainosus: Secondary | ICD-10-CM

## 2016-09-25 ENCOUNTER — Ambulatory Visit
Admission: RE | Admit: 2016-09-25 | Discharge: 2016-09-25 | Disposition: A | Discharge status: Home / Self Care | Payer: 59 | Source: Ambulatory Visit | Attending: Family Medicine | Admitting: Family Medicine

## 2016-09-25 DIAGNOSIS — M799 Soft tissue disorder, unspecified: Secondary | ICD-10-CM | POA: Diagnosis present

## 2016-09-30 ENCOUNTER — Telehealth: Payer: Self-pay | Admitting: Family Medicine

## 2016-09-30 MED ORDER — DULOXETINE HCL 30 MG PO CPEP: 90.0000 mg | ORAL_CAPSULE | Freq: Every day | ORAL | 1 refills | Status: DC

## 2016-09-30 NOTE — Telephone Encounter (Signed)
Sent to pharmacy 

## 2016-09-30 NOTE — Telephone Encounter (Signed)
Pt called and is requesting a refill on her DULoxetine (CYMBALTA) 30 MG capsule. Please advise, thank you!  [place], [place]  Call pt @ [phone]

## 2016-10-09 ENCOUNTER — Other Ambulatory Visit: Payer: Self-pay | Admitting: Neurology

## 2016-10-13 ENCOUNTER — Telehealth: Payer: Self-pay | Admitting: Family Medicine

## 2016-10-13 ENCOUNTER — Other Ambulatory Visit: Payer: Self-pay

## 2016-10-13 MED ORDER — DULOXETINE HCL 30 MG PO CPEP: 90.0000 mg | ORAL_CAPSULE | Freq: Every day | ORAL | 1 refills | Status: DC

## 2016-10-13 NOTE — Telephone Encounter (Signed)
Pt called and needs a prior authorization on her DULoxetine (CYMBALTA) 30 MG capsule. She has a new IT consultant. Pt states that she only has 1 pill left and that is for tonight. Please advise, thank you!  Call pt @ [phone]

## 2016-10-13 NOTE — Telephone Encounter (Signed)
Please advise 

## 2016-10-13 NOTE — Telephone Encounter (Signed)
Spoke with the patient, insurance was changed from [employer] to [facility] and we don't have card on file.  She states she brought card to last visit and it was scanned. I have can't complete a PA with out insurance information and I explained that to the patient.  She had requested the RX on the 15th and it was sent to the Mail order, I advised I could send to local pharmacy and see what needs to be done.

## 2016-11-03 NOTE — Progress Notes (Signed)
Office Visit Note  Patient: Margaret Silva             Date of Birth: March 01, 1980           MRN: [id number]             PCP: Leone Haven, MD Referring: Leone Haven, MD Visit Date: 11/12/2016 Occupation: @GUAROCC @    Subjective:  Neck pain, left hip pain   History of Present Illness: Margaret Silva is a 36 y.o. female with history of fibromyalgia syndrome. She states she's been having a lot of pain in her trapezius area and the left trochanteric area. She's been working more than 40 hours per week currently.  Activities of Daily Living:  Patient reports morning stiffness for all day.   Patient Reports nocturnal pain.  Difficulty dressing/grooming: Denies Difficulty climbing stairs: Denies Difficulty getting out of chair: Reports Difficulty using hands for taps, buttons, cutlery, and/or writing: Denies   Review of Systems  Constitutional: Positive for fatigue. Negative for night sweats, weight gain, weight loss and weakness.  HENT: Negative for mouth sores, trouble swallowing, trouble swallowing, mouth dryness and nose dryness.        Has discussed with primary care, will see GI soon   Eyes: Negative.  Negative for pain, redness, visual disturbance and dryness.  Respiratory: Negative.  Negative for cough, shortness of breath and difficulty breathing.   Cardiovascular: Negative for chest pain, palpitations, hypertension, irregular heartbeat and swelling in legs/feet.  Gastrointestinal: Negative.  Negative for blood in stool, constipation and diarrhea.  Endocrine: Negative.  Negative for increased urination.  Genitourinary: Negative.  Negative for vaginal dryness.  Musculoskeletal: Positive for arthralgias, joint pain, myalgias, morning stiffness, muscle tenderness and myalgias. Negative for joint swelling and muscle weakness.  Skin: Negative.  Negative for color change, rash, hair loss, skin tightness, ulcers and sensitivity to sunlight.    Allergic/Immunologic: Negative.  Negative for susceptible to infections.  Neurological: Negative for dizziness, light-headedness, headaches, memory loss and night sweats.  Hematological: Negative.  Negative for swollen glands.  Psychiatric/Behavioral: Negative.  Negative for depressed mood and sleep disturbance. The patient is not nervous/anxious.     PMFS History:  Patient Active Problem List   Diagnosis Date Noted  . Soft tissue lesion 08/05/2016  . Other fatigue 05/07/2016  . Trochanteric bursitis of left hip 05/07/2016  . Anxiety 11/06/2015  . GERD (gastroesophageal reflux disease) 09/30/2015  . Genital herpes 09/30/2015  . Hyperlipidemia LDL goal <130 07/25/2015  . IUD contraception 09/04/2014  . 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 Chronic insomnia 08/08/2014  . Allergic rhinitis with postnasal drip 01/26/2014  . Tinea versicolor 03/02/2013  . POTS (postural orthostatic tachycardia syndrome) 12/03/2010  . Endometriosis 12/03/2010  . Fibromyalgia 12/03/2010  . Restless leg syndrome 12/03/2010  . 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 IBS (irritable bowel syndrome) 12/03/2010  . Migraine with aura and without status migrainosus, not intractable 12/03/2010    Past Medical History:  Diagnosis Date  . Asthma   . Bursitis of left hip   . Diverticulitis    h/o  . Endometriosis   . GERD (gastroesophageal reflux disease)   . Headache   . History of chicken pox   . History of colonoscopy 11/04 and April 06  . History of laparoscopy 12/04 and 12/07  . History of laparoscopy 01/29/2006   Dr. Georga Bora Endometriosis  . Hx of migraines   . Hx:  UTI (urinary tract infection)   . Interstitial cystitis 01/03/2004   hydrodistilation  . Jaundice    h/o  . Plantar fasciitis, bilateral   . 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 Diabetes Maternal Grandmother   . Arthritis Maternal Grandmother   . Kidney disease Maternal Grandmother   . Hypertension Father   . Hyperlipidemia Father   . Fibromyalgia Mother   . Migraines Mother   . Cancer Mother 70       breast  . Ehlers-Danlos syndrome Sister   . Fibromyalgia Sister   . Heart attack Paternal Grandfather   . Cancer Paternal Grandfather        Pancreatic/ Bladder Cancer/prostate  . Arthritis Paternal Grandfather   . Heart disease Paternal Grandfather   . Cancer Paternal Grandmother        Liver cancer/lung cancer  . Diabetes Paternal Grandmother   . Heart attack Maternal Grandfather   . Heart disease Maternal Grandfather   . Hypertension Maternal Grandfather   . Mental illness Maternal Grandfather    Past Surgical History:  Procedure Laterality Date  . CYSTOSCOPY WITH HYDRODISTENSION AND BIOPSY    . DIAGNOSTIC LAPAROSCOPY  02/16/2003  . SIGMOIDOSCOPY  05/26/10   Social History   Social History Narrative   Lives with dog   Caffeine use:  Every week      Objective: Vital Signs: BP 119/83   Pulse 80   Resp 16   Ht 5\' 4"  (1.626 m)   Wt 194 lb (88 kg)   BMI 33.30 kg/m    Physical Exam  Constitutional: She is oriented to person, place, and time. She appears well-developed and well-nourished.  HENT:  Head: Normocephalic and atraumatic.  Eyes: Conjunctivae and EOM are normal.  Neck: Normal range of motion.  Cardiovascular: Normal rate, regular rhythm, normal heart sounds and intact distal pulses.   Pulmonary/Chest: Effort normal and breath sounds normal.  Abdominal: Soft. Bowel sounds are normal.  Lymphadenopathy:    She has no cervical adenopathy.  Neurological: She is alert and oriented to person, place, and time.  Skin: Skin is warm and dry. Capillary refill takes less than 2 seconds.  Psychiatric: She has a normal mood and affect. Her behavior is normal.  Nursing note and vitals reviewed.    Musculoskeletal Exam: C-spine and  thoracic lumbar spine good range of motion. She had trapezius spasm bilaterally. Shoulder joints elbow joints wrist joints MCPs, PIPs, DIPs with good range of motion with no synovitis. Hip joints, knee joints, ankle joints, MTPs PIPs with good range of motion with no synovitis. She had tenderness on palpation over left trochanteric bursa area. Fibromyalgia tender points were 8 out of 18 positive.  CDAI Exam: No CDAI exam completed.    Investigation: No additional findings.  CBC Latest Ref Rng & Units 04/30/2016 03/09/2014 03/03/2013  WBC 10:3/mL 5.6 7.0 6.4  Hemoglobin 12.0 - 16.0 g/dL 13.7 14.4 13.4  Hematocrit 36 - 46 % 42 42 39.6  Platelets 150 - 399 K/L 360 374 232.0   CMP Latest Ref Rng & Units 04/30/2016 03/09/2014 03/03/2013  Glucose 70 - 99 mg/dL - - 85  BUN 4 - 21 mg/dL 15 21 18   Creatinine 0.5 - 1.1 mg/dL 0.8 0.9 0.9  Sodium 137 - 147 mmol/L 137 140 141  Potassium 3.4 - 5.3 mmol/L 4.6 5.2 4.8  Chloride 96 - 112 mEq/L - -  106  CO2 19 - 32 mEq/L - - 26  Calcium 8.4 - 10.5 mg/dL - - 9.3  Total Protein 6.0 - 8.3 g/dL - - 6.7  Total Bilirubin 0.3 - 1.2 mg/dL - - 0.5  Alkaline Phos 25 - 125 U/L 88 77 59  AST 13 - 35 U/L 14 17 13   ALT 7 - 35 U/L 17 14 11     Imaging: No results found.  Speciality Comments: No specialty comments available.    Procedures:  Trigger Point Inj Date/Time: 11/12/2016 2:57 PM Performed by: Bo Merino Authorized by: Bo Merino   Consent Given by:  Patient Site marked: the procedure site was marked   Timeout: prior to procedure the correct patient, procedure, and site was verified   Indications:  Muscle spasm and pain Total # of Trigger Points:  2 Location: neck   Needle Size:  27 G Approach:  Dorsal Medications #1:  0.5 mL lidocaine 1 %; 10 mg triamcinolone acetonide 40 MG/ML Medications #2:  0.5 mL lidocaine 1 %; 10 mg triamcinolone acetonide 40 MG/ML Patient tolerance:  Patient tolerated the procedure well with no immediate  complications Large Joint Inj Date/Time: 11/12/2016 2:58 PM Performed by: Bo Merino Authorized by: Bo Merino   Consent Given by:  Patient Site marked: the procedure site was marked   Timeout: prior to procedure the correct patient, procedure, and site was verified   Indications:  Pain Location:  Hip Site:  L greater trochanter Prep: patient was prepped and draped in usual sterile fashion   Needle Size:  27 G Needle Length:  1.5 inches Approach:  Lateral Ultrasound Guidance: No   Fluoroscopic Guidance: No   Arthrogram: No   Medications:  40 mg triamcinolone acetonide 40 MG/ML; 1.5 mL lidocaine 1 % Aspiration Attempted: No   Aspirate amount (mL):  0 Patient tolerance:  Patient tolerated the procedure well with no immediate complications   Allergies: Coffee bean extract  [coffea arabica]; Other; and Trazodone and nefazodone   Assessment / Plan:     Visit Diagnoses: Fibromyalgia: She continues to some generalized pain and discomfort and positive tender points.  Chronic insomnia: Good sleep hygiene was discussed.  Other fatigue: Related to insomnia and also working long hours.  Neck pain: She has bilateral trapezius spasm. Per her request bilateral trapezius area were injected with cortisone as described above. I've given her a handout on neck exercises.  Trochanteric bursitis of left hip: Severe pain and discomfort. Different treatment options were discussed. A handout on IT band exercises were also given.  Her requests left trochanteric bursa was injected with cortisone the procedures described above.  History of migraine: Doing better with current treatment she's been seen by neurologist.  History of anxiety  History of restless legs syndrome  History of cystitis ([place])  History of orthostatic hypotension  Other irritable bowel syndrome    Orders: Orders Placed This Encounter  Procedures  . Trigger Point Injection  . Large Joint  Injection/Arthrocentesis   No orders of the defined types were placed in this encounter.     Follow-Up Instructions: Return in about 6 months (around 05/12/2017) for FMS.   Bo Merino, MD  Note - This record has been created using Editor, commissioning.  Chart creation errors have been sought, but may not always  have been located. Such creation errors do not reflect on  the standard of medical care.

## 2016-11-06 ENCOUNTER — Encounter: Payer: Self-pay | Admitting: Family Medicine

## 2016-11-06 ENCOUNTER — Ambulatory Visit (INDEPENDENT_AMBULATORY_CARE_PROVIDER_SITE_OTHER): Payer: 59 | Admitting: Family Medicine

## 2016-11-06 VITALS — BP 122/74 | HR 79 | Temp 98.2°F | Ht 64.0 in | Wt 196.0 lb

## 2016-11-06 DIAGNOSIS — F5104 Psychophysiologic insomnia: Secondary | ICD-10-CM | POA: Diagnosis not present

## 2016-11-06 DIAGNOSIS — K219 Gastro-esophageal reflux disease without esophagitis: Secondary | ICD-10-CM | POA: Diagnosis not present

## 2016-11-06 DIAGNOSIS — R1310 Dysphagia, unspecified: Secondary | ICD-10-CM

## 2016-11-06 MED ORDER — ZOLPIDEM TARTRATE ER 6.25 MG PO TBCR: 6.2500 mg | EXTENDED_RELEASE_TABLET | Freq: Every evening | ORAL | 2 refills | Status: DC | PRN

## 2016-11-06 NOTE — Assessment & Plan Note (Addendum)
Well-controlled on omeprazole though possibly having some dysphagia. Rectal bleeding is stable per her report. We'll refer back to GI for evaluation. Given return precautions.

## 2016-11-06 NOTE — Progress Notes (Signed)
  Tommi Rumps, MD Phone: [phone]  Margaret Silva is a 36 y.o. female who presents today for follow-up.  Patient notes over the last year or so she has had intermittent trouble swallowing. Started after she got Botox for her migraines. Notes it was quite bad after that and she had some right eye droop at that time. She discussed it with her neurologist and they told her to monitor and it showed improve. She notes the other symptoms have improved though she continues to have issues swallowing liquids. Feels as though they go down the wrong pipe at times. Also occasionally food will feel like it gets stuck. She does have reflux and takes omeprazole. She was evaluated at [facility] last year for her rectal bleeding. She notes intermittent issues with that though it is not worse than prior.  Notes her sleep is better. Taking the Ambien nightly. No drowsiness. No abnormal nighttime activities. She is typically fairly tired his work is quite busy and they are short staffed.  PMH: nonsmoker.   ROS see history of present illness  Objective  Physical Exam Vitals:   11/06/16 1501  BP: 122/74  Pulse: 79  Temp: 98.2 F (36.8 C)  SpO2: 99%    BP Readings from Last 3 Encounters:  11/06/16 122/74  08/05/16 120/78  05/12/16 124/74   Wt Readings from Last 3 Encounters:  11/06/16 196 lb (88.9 kg)  08/05/16 204 lb 12.8 oz (92.9 kg)  05/12/16 193 lb (87.5 kg)    Physical Exam  Constitutional: No distress.  Cardiovascular: Normal rate, regular rhythm and normal heart sounds.   Pulmonary/Chest: Effort normal and breath sounds normal.  Abdominal: Soft. Bowel sounds are normal. She exhibits no distension. There is no tenderness.  Neurological: She is alert. Gait normal.  Skin: Skin is warm and dry. She is not diaphoretic.     Assessment/Plan: Please see individual problem list.  GERD (gastroesophageal reflux disease) Well-controlled on omeprazole though possibly having some  dysphagia. Rectal bleeding is stable per her report. We'll refer back to GI for evaluation. Given return precautions.  Chronic insomnia Stable on Ambien. Refill given.   Orders Placed This Encounter  Procedures  . Ambulatory referral to Gastroenterology    Referral Priority:   Routine    Referral Type:   Consultation    Referral Reason:   Specialty Services Required    Number of Visits Requested:   1    Meds ordered this encounter  Medications  . zolpidem (AMBIEN CR) 6.25 MG CR tablet    Sig: Take 1 tablet (6.25 mg total) by mouth at bedtime as needed. for sleep    Dispense:  30 tablet    Refill:  2    Not to exceed 5 additional fills before 05/04/2016.   Tommi Rumps, MD [place]

## 2016-11-06 NOTE — Patient Instructions (Signed)
Nice to see you. We'll get you back in to see GI. I refilled her Ambien. If you have excessive rectal bleeding rehabilitation and inability to swallow you should be reevaluated.

## 2016-11-06 NOTE — Assessment & Plan Note (Signed)
Stable on Ambien. Refill given.

## 2016-11-06 NOTE — Progress Notes (Signed)
Pre visit review using our clinic review tool, if applicable. No additional management support is needed unless otherwise documented below in the visit note. 

## 2016-11-12 ENCOUNTER — Ambulatory Visit (INDEPENDENT_AMBULATORY_CARE_PROVIDER_SITE_OTHER): Payer: 59 | Admitting: Rheumatology

## 2016-11-12 ENCOUNTER — Encounter: Payer: Self-pay | Admitting: Rheumatology

## 2016-11-12 VITALS — BP 119/83 | HR 80 | Resp 16 | Ht 64.0 in | Wt 194.0 lb

## 2016-11-12 DIAGNOSIS — Z8679 Personal history of other diseases of the circulatory system: Secondary | ICD-10-CM

## 2016-11-12 DIAGNOSIS — R5383 Other fatigue: Secondary | ICD-10-CM

## 2016-11-12 DIAGNOSIS — F5104 Psychophysiologic insomnia: Secondary | ICD-10-CM | POA: Diagnosis not present

## 2016-11-12 DIAGNOSIS — Z8669 Personal history of other diseases of the nervous system and sense organs: Secondary | ICD-10-CM | POA: Diagnosis not present

## 2016-11-12 DIAGNOSIS — M7062 Trochanteric bursitis, left hip: Secondary | ICD-10-CM | POA: Diagnosis not present

## 2016-11-12 DIAGNOSIS — M797 Fibromyalgia: Secondary | ICD-10-CM | POA: Diagnosis not present

## 2016-11-12 DIAGNOSIS — M542 Cervicalgia: Secondary | ICD-10-CM | POA: Diagnosis not present

## 2016-11-12 DIAGNOSIS — Z87440 Personal history of urinary (tract) infections: Secondary | ICD-10-CM | POA: Diagnosis not present

## 2016-11-12 DIAGNOSIS — K588 Other irritable bowel syndrome: Secondary | ICD-10-CM

## 2016-11-12 DIAGNOSIS — Z8659 Personal history of other mental and behavioral disorders: Secondary | ICD-10-CM

## 2016-11-12 MED ORDER — LIDOCAINE HCL 1 % IJ SOLN
0.5000 mL | INTRAMUSCULAR | Status: AC | PRN
  Administered 2016-11-12: .5 mL

## 2016-11-12 MED ORDER — TRIAMCINOLONE ACETONIDE 40 MG/ML IJ SUSP
40.0000 mg | INTRAMUSCULAR | Status: AC | PRN
  Administered 2016-11-12: 40 mg via INTRA_ARTICULAR

## 2016-11-12 MED ORDER — TRIAMCINOLONE ACETONIDE 40 MG/ML IJ SUSP
10.0000 mg | INTRAMUSCULAR | Status: AC | PRN
  Administered 2016-11-12: 10 mg via INTRAMUSCULAR

## 2016-11-12 MED ORDER — LIDOCAINE HCL 1 % IJ SOLN
1.5000 mL | INTRAMUSCULAR | Status: AC | PRN
  Administered 2016-11-12: 1.5 mL

## 2016-11-12 NOTE — Patient Instructions (Signed)
Iliotibial Bursitis Rehab Ask your health care provider which exercises are safe for you. Do exercises exactly as told by your health care provider and adjust them as directed. It is normal to feel mild stretching, pulling, tightness, or discomfort as you do these exercises, but you should stop right away if you feel sudden pain or your pain gets worse.Do not begin these exercises until told by your health care provider. Stretching and range of motion exercises These exercises warm up your muscles and joints and improve the movement and flexibility of your leg. These exercises also help to relieve pain and stiffness. Exercise A: Quadriceps stretch, prone  1. Lie on your abdomen on a firm surface, such as a bed or padded floor. 2. Bend your left / right knee and hold your ankle. If you cannot reach your ankle or pant leg, loop a belt around your foot and grab the belt instead. 3. Gently pull your heel toward your buttocks. Your knee should not slide out to the side. You should feel a stretch in the front of your thigh and knee. 4. Hold this position for __________ seconds. Repeat __________ times. Complete this exercise __________ times a day. Exercise B: Lunge ( adductor stretch) 1. Stand and spread your legs about 3 feet (about 1 m) apart. Put your left / right leg slightly back for balance. 2. Lean away from your left / right leg by bending your other knee and shifting your weight toward your bent knee. You may rest your hands on your thigh for balance. You should feel a stretch in your left / right inner thigh. 3. Hold for __________ seconds. Repeat __________ times. Complete this exercise __________ times a day. Exercise C: Hamstring stretch, supine  1. Lie on your back. 2. Hold both ends of a belt or towel as you loop it over the ball of your left / right foot. The ball of your foot is on the walking surface, right under your toes. 3. Straighten your left / right knee and slowly pull on  the belt to raise your leg. Stop when you feel a gentle stretch in the back of your left / right knee or thigh. ? Do not let your left / right knee bend. ? Keep your other leg flat on the floor. 4. Hold this position for __________ seconds. Repeat __________ times. Complete this exercise __________ times a day. Strengthening exercises These exercises build strength and endurance in your leg. Endurance is the ability to use your muscles for a long time, even after they get tired. Exercise D: Quadriceps wall slides  1. Lean your back against a smooth wall or door while you walk your feet out 18-24 inches (46-61 cm) from it. 2. Place your feet hip-width apart. 3. Slowly slide down the wall or door until your knees bend as far as told by your health care provider. Keep your knees over your heels, not your toes. Keep your knees in line with your hips. 4. Hold for __________ seconds. 5. Push through your heels to stand up to rest for __________ seconds after each repetition. Repeat __________ times. Complete this exercise __________ times a day. Exercise E: Straight leg raises ( hip abductors) 1. Lie on your side, with your left / right leg in the top position. Lie so your head, shoulder, knee, and hip line up with each other. You may bend your bottom knee to help you balance. 2. Lift your top leg 4-6 inches (10-15 cm) while keeping your   toes pointed straight ahead. 3. Hold this position for __________ seconds. 4. Slowly lower your leg to the starting position. Allow your muscles to relax completely after each repetition. Repeat __________ times. Complete this exercise __________ times a day. Exercise F: Straight leg raises ( hip extensors) 1. Lie on your abdomen on a firm surface. You can put a pillow under your hips if that is more comfortable. 2. Tense the muscles in your buttocks and lift your left / right leg about 4-6 inches (10-15 cm). Keep your knee straight as you lift your leg. 3. Hold  this position for __________ seconds. 4. Slowly lower your leg to the starting position. 5. Let your leg relax completely after each repetition. Repeat __________ times. Complete this exercise __________ times a day. Exercise G: Bridge ( hip extensors) 1. Lie on your back on a firm surface with your knees bent and your feet flat on the floor. 2. Tighten your buttocks muscles and lift your bottom off the floor until your trunk is level with your thighs. ? Do not arch your back. ? You should feel the muscles working in your buttocks and the back of your thighs. If you do not feel these muscles, slide your feet 1-2 inches (2.5-5 cm) farther away from your buttocks. 3. Hold this position for __________ seconds. 4. Slowly lower your hips to the starting position. 5. Let your buttocks muscles relax completely between repetitions. 6. If this exercise is too easy, try doing it with your arms crossed over your chest. Repeat __________ times. Complete this exercise __________ times a day. This information is not intended to replace advice given to you by your health care provider. Make sure you discuss any questions you have with your health care provider. Document Released: 02/02/2005 Document Revised: 10/10/2015 Document Reviewed: 01/15/2015 Elsevier Interactive Patient Education  2018 [place]. Cervical Strain and Sprain Rehab Ask your health care provider which exercises are safe for you. Do exercises exactly as told by your health care provider and adjust them as directed. It is normal to feel mild stretching, pulling, tightness, or discomfort as you do these exercises, but you should stop right away if you feel sudden pain or your pain gets worse.Do not begin these exercises until told by your health care provider. Stretching and range of motion exercises These exercises warm up your muscles and joints and improve the movement and flexibility of your neck. These exercises also help to relieve  pain, numbness, and tingling. Exercise A: Cervical side bend  1. Using good posture, sit on a stable chair or stand up. 2. Without moving your shoulders, slowly tilt your left / right ear to your shoulder until you feel a stretch in your neck muscles. You should be looking straight ahead. 3. Hold for __________ seconds. 4. Repeat with the other side of your neck. Repeat __________ times. Complete this exercise __________ times a day. Exercise B: Cervical rotation  1. Using good posture, sit on a stable chair or stand up. 2. Slowly turn your head to the side as if you are looking over your left / right shoulder. ? Keep your eyes level with the ground. ? Stop when you feel a stretch along the side and the back of your neck. 3. Hold for __________ seconds. 4. Repeat this by turning to your other side. Repeat __________ times. Complete this exercise __________ times a day. Exercise C: Thoracic extension and pectoral stretch 1. Roll a towel or a small blanket so it is  about 4 inches (10 cm) in diameter. 2. Lie down on your back on a firm surface. 3. Put the towel lengthwise, under your spine in the middle of your back. It should not be not under your shoulder blades. The towel should line up with your spine from your middle back to your lower back. 4. Put your hands behind your head and let your elbows fall out to your sides. 5. Hold for __________ seconds. Repeat __________ times. Complete this exercise __________ times a day. Strengthening exercises These exercises build strength and endurance in your neck. Endurance is the ability to use your muscles for a long time, even after your muscles get tired. Exercise D: Upper cervical flexion, isometric 1. Lie on your back with a thin pillow behind your head and a small rolled-up towel under your neck. 2. Gently tuck your chin toward your chest and nod your head down to look toward your feet. Do not lift your head off the pillow. 3. Hold for  __________ seconds. 4. Release the tension slowly. Relax your neck muscles completely before you repeat this exercise. Repeat __________ times. Complete this exercise __________ times a day. Exercise E: Cervical extension, isometric  1. Stand about 6 inches (15 cm) away from a wall, with your back facing the wall. 2. Place a soft object, about 6-8 inches (15-20 cm) in diameter, between the back of your head and the wall. A soft object could be a small pillow, a ball, or a folded towel. 3. Gently tilt your head back and press into the soft object. Keep your jaw and forehead relaxed. 4. Hold for __________ seconds. 5. Release the tension slowly. Relax your neck muscles completely before you repeat this exercise. Repeat __________ times. Complete this exercise __________ times a day. Posture and body mechanics  Body mechanics refers to the movements and positions of your body while you do your daily activities. Posture is part of body mechanics. Good posture and healthy body mechanics can help to relieve stress in your body's tissues and joints. Good posture means that your spine is in its natural S-curve position (your spine is neutral), your shoulders are pulled back slightly, and your head is not tipped forward. The following are general guidelines for applying improved posture and body mechanics to your everyday activities. Standing  When standing, keep your spine neutral and keep your feet about hip-width apart. Keep a slight bend in your knees. Your ears, shoulders, and hips should line up.  When you do a task in which you stand in one place for a long time, place one foot up on a stable object that is 2-4 inches (5-10 cm) high, such as a footstool. This helps keep your spine neutral. Sitting   When sitting, keep your spine neutral and your keep feet flat on the floor. Use a footrest, if necessary, and keep your thighs parallel to the floor. Avoid rounding your shoulders, and avoid tilting  your head forward.  When working at a desk or a computer, keep your desk at a height where your hands are slightly lower than your elbows. Slide your chair under your desk so you are close enough to maintain good posture.  When working at a computer, place your monitor at a height where you are looking straight ahead and you do not have to tilt your head forward or downward to look at the screen. Resting When lying down and resting, avoid positions that are most painful for you. Try to support your  neck in a neutral position. You can use a contour pillow or a small rolled-up towel. Your pillow should support your neck but not push on it. This information is not intended to replace advice given to you by your health care provider. Make sure you discuss any questions you have with your health care provider. Document Released: 02/02/2005 Document Revised: 10/10/2015 Document Reviewed: 01/09/2015 Elsevier Interactive Patient Education  [employer].

## 2016-11-15 ENCOUNTER — Other Ambulatory Visit: Payer: Self-pay | Admitting: Neurology

## 2016-11-15 DIAGNOSIS — G43009 Migraine without aura, not intractable, without status migrainosus: Secondary | ICD-10-CM

## 2016-11-22 ENCOUNTER — Other Ambulatory Visit: Payer: Self-pay | Admitting: Family Medicine

## 2016-11-22 DIAGNOSIS — B009 Herpesviral infection, unspecified: Secondary | ICD-10-CM

## 2016-11-23 ENCOUNTER — Other Ambulatory Visit: Payer: Self-pay | Admitting: Neurology

## 2016-11-23 DIAGNOSIS — G43009 Migraine without aura, not intractable, without status migrainosus: Secondary | ICD-10-CM

## 2016-12-01 ENCOUNTER — Other Ambulatory Visit: Payer: Self-pay | Admitting: Family Medicine

## 2016-12-02 ENCOUNTER — Other Ambulatory Visit: Payer: Self-pay | Admitting: Family Medicine

## 2016-12-02 ENCOUNTER — Other Ambulatory Visit: Payer: Self-pay | Admitting: Neurology

## 2016-12-02 DIAGNOSIS — G43009 Migraine without aura, not intractable, without status migrainosus: Secondary | ICD-10-CM

## 2016-12-03 ENCOUNTER — Other Ambulatory Visit: Payer: Self-pay | Admitting: *Deleted

## 2016-12-03 NOTE — Telephone Encounter (Signed)
Called pt, LVM asking for call back.   Called to offer to setup appt for yearly f/u for further Depakote refills.

## 2017-01-24 ENCOUNTER — Other Ambulatory Visit: Payer: Self-pay | Admitting: Family Medicine

## 2017-01-25 ENCOUNTER — Telehealth: Payer: Self-pay | Admitting: *Deleted

## 2017-01-25 NOTE — Telephone Encounter (Signed)
Called and LVM (ok per DPR) informing patient of office closure 12/11 due to snow. We will be calling her back ASAP to reschedule. I also called the patient's mother Thayer Headings (on [place]) on spoke with her directly. She will pass the message to the patient and was appreciative of the call.

## 2017-01-26 ENCOUNTER — Ambulatory Visit: Payer: 59 | Admitting: Neurology

## 2017-01-28 NOTE — Telephone Encounter (Signed)
Called and LVM asking for call back so we can get her r/s.

## 2017-01-31 ENCOUNTER — Other Ambulatory Visit: Payer: Self-pay | Admitting: Family Medicine

## 2017-02-02 MED ORDER — SUMATRIPTAN SUCCINATE 100 MG PO TABS: ORAL_TABLET | ORAL | 1 refills | Status: DC

## 2017-02-02 NOTE — Telephone Encounter (Signed)
Called and spoke with the patient. She is doing ok and r/s for March 17, 2017 @ 2:00 arrival time 1:30. She asks for a refill on Sumatriptan. Refill ordered x 2 month supply.

## 2017-02-02 NOTE — Addendum Note (Signed)
Addended by: Gildardo Griffes on: 02/02/2017 01:59 PM   Modules accepted: Orders

## 2017-02-04 ENCOUNTER — Other Ambulatory Visit: Payer: Self-pay | Admitting: Neurology

## 2017-02-04 DIAGNOSIS — G43009 Migraine without aura, not intractable, without status migrainosus: Secondary | ICD-10-CM

## 2017-02-12 ENCOUNTER — Ambulatory Visit: Payer: 59 | Admitting: Family Medicine

## 2017-02-22 ENCOUNTER — Other Ambulatory Visit: Payer: Self-pay | Admitting: Family Medicine

## 2017-02-23 NOTE — Telephone Encounter (Signed)
Last OV 11/06/16 last filled 11/06/16 30 2rf

## 2017-03-17 ENCOUNTER — Ambulatory Visit: Payer: Self-pay | Admitting: Neurology

## 2017-04-12 ENCOUNTER — Ambulatory Visit: Payer: 59 | Admitting: Family Medicine

## 2017-04-30 ENCOUNTER — Other Ambulatory Visit: Payer: Self-pay

## 2017-04-30 MED ORDER — DULOXETINE HCL 30 MG PO CPEP: 90.0000 mg | ORAL_CAPSULE | Freq: Every day | ORAL | 1 refills | Status: DC

## 2017-04-30 NOTE — Progress Notes (Deleted)
Office Visit Note  Patient: Margaret Silva             Date of Birth: July 28, 1980           MRN: [id number]             PCP: Leone Haven, MD Referring: Leone Haven, MD Visit Date: 05/13/2017 Occupation: @GUAROCC @    Subjective:  No chief complaint on file.   History of Present Illness: Margaret Silva is a 37 y.o. female ***   Activities of Daily Living:  Patient reports morning stiffness for *** {minute/hour:19697}.   Patient {ACTIONS;DENIES/REPORTS:21021675::"Denies"} nocturnal pain.  Difficulty dressing/grooming: {ACTIONS;DENIES/REPORTS:21021675::"Denies"} Difficulty climbing stairs: {ACTIONS;DENIES/REPORTS:21021675::"Denies"} Difficulty getting out of chair: {ACTIONS;DENIES/REPORTS:21021675::"Denies"} Difficulty using hands for taps, buttons, cutlery, and/or writing: {ACTIONS;DENIES/REPORTS:21021675::"Denies"}   No Rheumatology ROS completed.   PMFS History:  Patient Active Problem List   Diagnosis Date Noted  . Soft tissue lesion 08/05/2016  . Other fatigue 05/07/2016  . Trochanteric bursitis of left hip 05/07/2016  . Anxiety 11/06/2015  . GERD (gastroesophageal reflux disease) 09/30/2015  . Genital herpes 09/30/2015  . Hyperlipidemia LDL goal <130 07/25/2015  . IUD contraception 09/04/2014  . 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 Chronic insomnia 08/08/2014  . Allergic rhinitis with postnasal drip 01/26/2014  . Tinea versicolor 03/02/2013  . POTS (postural orthostatic tachycardia syndrome) 12/03/2010  . Endometriosis 12/03/2010  . Fibromyalgia 12/03/2010  . Restless leg syndrome 12/03/2010  . 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 IBS (irritable bowel syndrome) 12/03/2010  . Migraine with aura and without status migrainosus, not intractable 12/03/2010    Past Medical History:  Diagnosis Date  . Asthma   . Bursitis of left hip   . Diverticulitis    h/o  .  Endometriosis   . GERD (gastroesophageal reflux disease)   . Headache   . History of chicken pox   . History of colonoscopy 11/04 and April 06  . History of laparoscopy 12/04 and 12/07  . History of laparoscopy 01/29/2006   Dr. Georga Bora Endometriosis  . Hx of migraines   . Hx: UTI (urinary tract infection)   . Interstitial cystitis 01/03/2004   hydrodistilation  . Jaundice    h/o  . Plantar fasciitis, bilateral   . 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 Diabetes Maternal Grandmother   . Arthritis Maternal Grandmother   . Kidney disease Maternal Grandmother   . Hypertension Father   . Hyperlipidemia Father   . Fibromyalgia Mother   . Migraines Mother   . Cancer Mother 66       breast  . Ehlers-Danlos syndrome Sister   . Fibromyalgia Sister   . Heart attack Paternal Grandfather   . Cancer Paternal Grandfather        Pancreatic/ Bladder Cancer/prostate  . Arthritis Paternal Grandfather   . Heart disease Paternal Grandfather   . Cancer Paternal Grandmother        Liver cancer/lung cancer  . Diabetes Paternal Grandmother   . Heart attack Maternal Grandfather   . Heart disease Maternal Grandfather   . Hypertension Maternal Grandfather   . Mental illness Maternal Grandfather    Past Surgical History:  Procedure Laterality Date  . CYSTOSCOPY WITH HYDRODISTENSION AND BIOPSY    . DIAGNOSTIC LAPAROSCOPY  02/16/2003  . SIGMOIDOSCOPY  05/26/10   Social History  Social History Narrative   Lives with dog   Caffeine use:  Every week      Objective: Vital Signs: There were no vitals taken for this visit.   Physical Exam   Musculoskeletal Exam: ***  CDAI Exam: No CDAI exam completed.    Investigation: No additional findings.   Imaging: No results found.  Speciality Comments: No specialty comments available.    Procedures:  No procedures performed Allergies: Coffee bean  extract  [coffea arabica]; Other; and Trazodone and nefazodone   Assessment / Plan:     Visit Diagnoses: No diagnosis found.    Orders: No orders of the defined types were placed in this encounter.  No orders of the defined types were placed in this encounter.   Face-to-face time spent with patient was *** minutes. 50% of time was spent in counseling and coordination of care.  Follow-Up Instructions: No Follow-up on file.   Earnestine Mealing, CMA  Note - This record has been created using Editor, commissioning.  Chart creation errors have been sought, but may not always  have been located. Such creation errors do not reflect on  the standard of medical care.

## 2017-05-10 ENCOUNTER — Other Ambulatory Visit: Payer: Self-pay | Admitting: Neurology

## 2017-05-13 ENCOUNTER — Ambulatory Visit: Payer: 59 | Admitting: Rheumatology

## 2017-05-17 ENCOUNTER — Other Ambulatory Visit: Payer: Self-pay | Admitting: Neurology

## 2017-05-17 ENCOUNTER — Telehealth: Payer: Self-pay | Admitting: Neurology

## 2017-05-17 NOTE — Telephone Encounter (Signed)
Margaret Silva I am happy to refill this patient's meds, she lost her insurance. Happy to refill for a year. Please call and see what exactly she needs.

## 2017-05-18 ENCOUNTER — Other Ambulatory Visit: Payer: Self-pay | Admitting: Neurology

## 2017-05-18 DIAGNOSIS — G43709 Chronic migraine without aura, not intractable, without status migrainosus: Secondary | ICD-10-CM

## 2017-05-18 DIAGNOSIS — G43909 Migraine, unspecified, not intractable, without status migrainosus: Secondary | ICD-10-CM

## 2017-05-18 DIAGNOSIS — G43009 Migraine without aura, not intractable, without status migrainosus: Secondary | ICD-10-CM

## 2017-05-18 MED ORDER — SUMATRIPTAN SUCCINATE 11 MG/NOSEPC NA EXHP: 1.0000 | INHALANT_POWDER | Freq: Once | NASAL | 11 refills | Status: DC

## 2017-05-18 MED ORDER — DIVALPROEX SODIUM 500 MG PO DR TAB: 500.0000 mg | DELAYED_RELEASE_TABLET | Freq: Every day | ORAL | 11 refills | Status: DC

## 2017-05-18 MED ORDER — AMITRIPTYLINE HCL 25 MG PO TABS: 50.0000 mg | ORAL_TABLET | Freq: Every day | ORAL | 11 refills | Status: DC

## 2017-05-19 ENCOUNTER — Other Ambulatory Visit: Payer: Self-pay

## 2017-05-19 ENCOUNTER — Encounter: Payer: Self-pay | Admitting: Family Medicine

## 2017-05-19 ENCOUNTER — Ambulatory Visit (INDEPENDENT_AMBULATORY_CARE_PROVIDER_SITE_OTHER): Payer: Self-pay | Admitting: Family Medicine

## 2017-05-19 DIAGNOSIS — F5104 Psychophysiologic insomnia: Secondary | ICD-10-CM

## 2017-05-19 DIAGNOSIS — J020 Streptococcal pharyngitis: Secondary | ICD-10-CM | POA: Insufficient documentation

## 2017-05-19 DIAGNOSIS — K219 Gastro-esophageal reflux disease without esophagitis: Secondary | ICD-10-CM

## 2017-05-19 DIAGNOSIS — M799 Soft tissue disorder, unspecified: Secondary | ICD-10-CM

## 2017-05-19 DIAGNOSIS — Z808 Family history of malignant neoplasm of other organs or systems: Secondary | ICD-10-CM | POA: Insufficient documentation

## 2017-05-19 MED ORDER — ZOLPIDEM TARTRATE ER 6.25 MG PO TBCR: EXTENDED_RELEASE_TABLET | ORAL | 2 refills | Status: DC

## 2017-05-19 NOTE — Assessment & Plan Note (Signed)
Improving.  She will complete antibiotics.

## 2017-05-19 NOTE — Assessment & Plan Note (Signed)
In patient's sister.  Benign exam in the patient today.  She will monitor for any lesions.  She has done fairly well with this and the death of her grandmother.

## 2017-05-19 NOTE — Progress Notes (Signed)
Tommi Rumps, MD Phone: [phone]  Margaret Silva is a 37 y.o. female who presents today for f/u.  GERD:   Reflux symptoms: some increase with recent strep throat illness   Abd pain: no   Blood in stool: no  Dysphagia: no   EGD: no prior  Medication: taking omeprazole at night  Insomnia: Taking Ambien.  Gets 7-8 hours of sleep per night.  No drowsiness.  She does not drink alcohol with this.  She was treated for strep throat with amoxicillin.  She has 1-2 days left of antibiotics.  Notes she feels quite a bit better.  No sore throat.  She notes her sister was recently diagnosed with thyroid cancer and had a thyroidectomy.  The patient has not noticed any lesions in her neck.  Her grandmother also recently died.  She is been handling things fairly well with no anxiety or depression.  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  She notes the soft tissue lesion is still there in her distal left quadricep.  It has not changed.  There is no pain.  Ultrasound previously was reassuring.   Social History   Tobacco Use  Smoking Status Never Smoker  Smokeless Tobacco Never Used     ROS see history of present illness  Objective  Physical Exam Vitals:   05/19/17 1322  BP: 118/88  Pulse: 98  Temp: 98.6 F (37 C)  SpO2: 97%    BP Readings from Last 3 Encounters:  05/19/17 118/88  11/12/16 119/83  11/06/16 122/74   Wt Readings from Last 3 Encounters:  05/19/17 178 lb 12.8 oz (81.1 kg)  11/12/16 194 lb (88 kg)  11/06/16 196 lb (88.9 kg)    Physical Exam  Constitutional: No distress.  HENT:  Head: Normocephalic and atraumatic.  Mouth/Throat: Oropharynx is clear and moist.  Neck: Neck supple. No thyromegaly present.  No thyroid nodules palpated  Cardiovascular: Normal rate, regular rhythm and normal heart sounds.  Pulmonary/Chest: Effort normal and breath sounds normal.  Abdominal: Soft. Bowel sounds are normal.  She exhibits no distension. There is no tenderness. There is no rebound and no guarding.  Musculoskeletal: She exhibits no edema.  Slight soft tissue prominence over her distal bilateral quadriceps left slightly greater than right, nontender  Neurological: She is alert. Gait normal.  Skin: Skin is warm and dry. She is not diaphoretic.     Assessment/Plan: Please see individual problem list.  GERD (gastroesophageal reflux disease) Slightly uncontrolled.  No additional dysphasia.  That could have been related to her Botox injections.  She has not been able to follow-up with GI yet given lack of insurance.  She will follow-up with them when she gets a job and has insurance.  She will switch her omeprazole to 30 minutes prior to breakfast.  Soft tissue lesion Prior ultrasound reassuring.  I have discussed further evaluation with MRI or referral with her though given no change she opts to monitor and consider further evaluation when she has insurance.  Chronic insomnia Stable on Ambien.  Refill given.  Follow-up in 6 months.  Advised to not drink alcohol with this.  Family history of thyroid cancer In patient's sister.  Benign exam in the patient today.  She will monitor for any lesions.  She has done fairly well with this and the death of her grandmother.  Strep throat Improving.  She will complete antibiotics.  No orders of the defined types  were placed in this encounter.   Meds ordered this encounter  Medications  . zolpidem (AMBIEN CR) 6.25 MG CR tablet    Sig: TAKE 1 TABLET BY MOUTH AT BEDTIME AS NEEDED SLEEP    Dispense:  30 tablet    Refill:  2    Not to exceed 3 additional fills before 05/05/2017.     Tommi Rumps, MD [place]

## 2017-05-19 NOTE — Assessment & Plan Note (Signed)
Prior ultrasound reassuring.  I have discussed further evaluation with MRI or referral with her though given no change she opts to monitor and consider further evaluation when she has insurance.

## 2017-05-19 NOTE — Assessment & Plan Note (Signed)
Slightly uncontrolled.  No additional dysphasia.  That could have been related to her Botox injections.  She has not been able to follow-up with GI yet given lack of insurance.  She will follow-up with them when she gets a job and has insurance.  She will switch her omeprazole to 30 minutes prior to breakfast.

## 2017-05-19 NOTE — Patient Instructions (Signed)
Nice to see you. Please try switching your omeprazole to 30 minutes before breakfast. If your strep throat does not continue to improve please let us know. If you ever noticed any lumps or bumps in your neck please let us know.

## 2017-05-19 NOTE — Assessment & Plan Note (Signed)
Stable on Ambien.  Refill given.  Follow-up in 6 months.  Advised to not drink alcohol with this.

## 2017-06-29 ENCOUNTER — Other Ambulatory Visit: Payer: Self-pay | Admitting: Family Medicine

## 2017-08-05 ENCOUNTER — Other Ambulatory Visit: Payer: Self-pay | Admitting: Family Medicine

## 2017-08-05 DIAGNOSIS — B009 Herpesviral infection, unspecified: Secondary | ICD-10-CM

## 2017-08-05 MED ORDER — DULOXETINE HCL 30 MG PO CPEP: ORAL_CAPSULE | ORAL | 0 refills | Status: DC

## 2017-08-05 MED ORDER — VALACYCLOVIR HCL 500 MG PO TABS: 500.0000 mg | ORAL_TABLET | Freq: Every day | ORAL | 0 refills | Status: DC

## 2017-08-05 NOTE — Telephone Encounter (Signed)
Copied from [place] [phone]. Topic: General - Other >> Aug 05, 2017 10:01 AM Oneta Rack wrote:  Relation to pt: self  Call back number: [phone] Pharmacy: [id number] - [place], [address] [phone] (Phone) [phone] (Fax)  Reason for call:  Pharmacy requesting the following medication  valACYclovir (VALTREX) 500 MG tablet and DULoxetine (CYMBALTA) 30 MG capsule, patient changed pharmacy, informed pharmacy pleas allow 48 to 72 hour turn around, please advise

## 2017-08-18 ENCOUNTER — Other Ambulatory Visit: Payer: Self-pay | Admitting: Family Medicine

## 2017-08-30 ENCOUNTER — Ambulatory Visit (INDEPENDENT_AMBULATORY_CARE_PROVIDER_SITE_OTHER): Payer: 59 | Admitting: Certified Nurse Midwife

## 2017-08-30 ENCOUNTER — Encounter: Payer: Self-pay | Admitting: Certified Nurse Midwife

## 2017-08-30 ENCOUNTER — Other Ambulatory Visit: Payer: Self-pay

## 2017-08-30 VITALS — BP 98/70 | HR 100 | Ht 64.0 in | Wt 206.5 lb

## 2017-08-30 DIAGNOSIS — Z01419 Encounter for gynecological examination (general) (routine) without abnormal findings: Secondary | ICD-10-CM

## 2017-08-30 DIAGNOSIS — Z808 Family history of malignant neoplasm of other organs or systems: Secondary | ICD-10-CM | POA: Diagnosis not present

## 2017-08-30 DIAGNOSIS — Z6835 Body mass index (BMI) 35.0-35.9, adult: Secondary | ICD-10-CM | POA: Diagnosis not present

## 2017-08-30 DIAGNOSIS — Z113 Encounter for screening for infections with a predominantly sexual mode of transmission: Secondary | ICD-10-CM

## 2017-08-30 DIAGNOSIS — Z124 Encounter for screening for malignant neoplasm of cervix: Secondary | ICD-10-CM | POA: Diagnosis not present

## 2017-08-30 MED ORDER — DULOXETINE HCL 30 MG PO CPEP: ORAL_CAPSULE | ORAL | 1 refills | Status: DC

## 2017-08-30 NOTE — Progress Notes (Signed)
New pt is here to establish care and is due for a pap smear. IUD in place.

## 2017-08-30 NOTE — Progress Notes (Signed)
ANNUAL PREVENTATIVE CARE GYN  ENCOUNTER NOTE  Subjective:       Margaret Silva is a 36 y.o. G0P0000 female here for a routine annual gynecologic exam.  Current complaints: 1. Desires Annual labs and STD testing 2. Needs Pap  Denies difficulty breathing or respiratory distress, chest pain, abdominal pain, vaginal bleeding, dysuria, and leg pain or swelling.    Gynecologic History  No LMP recorded. (Menstrual status: IUD).  Contraception: IUD, Inserted: 10/2015  Last Pap: 08/2014. Results were: normal  Obstetric History  OB History  Gravida Para Term Preterm AB Living  0 0 0 0 0 0  SAB TAB Ectopic Multiple Live Births  0 0 0 0 0    Past Medical History:  Diagnosis Date  . Asthma   . Bursitis of left hip   . Diverticulitis    h/o  . Endometriosis   . GERD (gastroesophageal reflux disease)   . Headache   . History of chicken pox   . History of colonoscopy 11/04 and April 06  . History of laparoscopy 12/04 and 12/07  . History of laparoscopy 01/29/2006   Dr. Georga Bora Endometriosis  . Hx of migraines   . Hx: UTI (urinary tract infection)   . Interstitial cystitis 01/03/2004   hydrodistilation  . Jaundice    h/o  . Plantar fasciitis, bilateral   . 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 CYSTOSCOPY WITH HYDRODISTENSION AND BIOPSY    . DIAGNOSTIC LAPAROSCOPY  02/16/2003  . SIGMOIDOSCOPY  05/26/10    Current Outpatient Medications on File Prior to Visit  Medication Sig Dispense Refill  . amitriptyline (ELAVIL) 25 MG tablet Take 2 tablets (50 mg total) by mouth at bedtime. 60 tablet 11  . cholecalciferol (VITAMIN D) 1000 UNITS tablet Take 1,000 Units by mouth daily.    . diclofenac sodium (VOLTAREN) 1 % GEL Apply 4 g topically 3 (three) times daily as needed.     . divalproex (DEPAKOTE) 500 MG DR tablet TAKE 1 TABLET (500 MG TOTAL) BY MOUTH DAILY. PLEASE KEEP PENDING APPT  TO CONTINUE REFILLS. 30 tablet 1  . divalproex (DEPAKOTE) 500 MG DR tablet Take 1 tablet (500 mg total) by mouth daily. 30 tablet 11  . gentamicin cream (GARAMYCIN) 0.1 % Apply 1 application topically as needed (for nose).    Marland Kitchen ibuprofen (ADVIL,MOTRIN) 800 MG tablet Take 1 tablet by mouth as needed.     Marland Kitchen levonorgestrel (MIRENA) 20 MCG/24HR IUD 1 each by Intrauterine route once.      . loratadine (CLARITIN) 10 MG tablet TAKE 1 TABLET (10 MG TOTAL) BY MOUTH DAILY. 90 tablet 3  . omeprazole (PRILOSEC) 20 MG capsule TAKE 1 CAPSULE (20 MG TOTAL) BY MOUTH DAILY. 90 capsule 3  . ondansetron (ZOFRAN-ODT) 4 MG disintegrating tablet Take 1 tablet (4 mg total) by mouth every 8 (eight) hours as needed for nausea. 30 tablet 12  . SUMAtriptan (IMITREX) 100 MG tablet TAKE 1 TABLET BY MOUTH ONCE AS NEEDED -- MAY TAKE SECOND DOSE AFTER 2 HOURS IF NEEDED  0  . Tavaborole (KERYDIN) 5 % SOLN Apply 1 application topically as needed.    . valACYclovir (VALTREX) 500 MG tablet Take 1 tablet (500 mg total) by mouth daily. 90 tablet 0  . zolpidem (AMBIEN CR) 6.25 MG CR tablet TAKE 1 TABLET BY MOUTH AT BEDTIME AS NEEDED SLEEP 30 tablet 2  . SUMAtriptan  Succinate (ONZETRA XSAIL) 11 MG/NOSEPC EXHP Place 1 Dose into both nostrils once for 1 dose. 1 spray per nostril  at onset of migraine, may repeat dose after 2 hours. Max twice daily. 1 each 11   No current facility-administered medications on file prior to visit.     Allergies  Allergen Reactions  . Coffee Bean Extract  [Coffea Arabica] Nausea Only  . Other     Outdoor allergies   . Trazodone And Nefazodone Other (See Comments)    irritability    Social History   Socioeconomic History  . Marital status: Divorced    Spouse name: Not on file  . Number of children: 0  . Years of education: 22   . Highest education level: Not on file  Occupational History  . Occupation: [employer]   Social Needs  . Financial resource strain: Not on file  . Food  insecurity:    Worry: Not on file    Inability: Not on file  . Transportation needs:    Medical: Not on file    Non-medical: Not on file  Tobacco Use  . Smoking status: Never Smoker  . Smokeless tobacco: Never Used  Substance and Sexual Activity  . Alcohol use: Yes    Alcohol/week: 0.0 oz    Comment: occasionally  . Drug use: No  . Sexual activity: Yes    Partners: Male    Birth control/protection: IUD  Lifestyle  . Physical activity:    Days per week: Not on file    Minutes per session: Not on file  . Stress: Not on file  Relationships  . Social connections:    Talks on phone: Not on file    Gets together: Not on file    Attends religious service: Not on file    Active member of club or organization: Not on file    Attends meetings of clubs or organizations: Not on file    Relationship status: Not on file  . Intimate partner violence:    Fear of current or ex partner: Not on file    Emotionally abused: Not on file    Physically abused: Not on file    Forced sexual activity: Not on file  Other Topics Concern  . Not on file  Social History Narrative   Lives with dog   Caffeine use:  Every week     Family History  Problem Relation Age of Onset  . Diabetes Maternal Grandmother   . Arthritis Maternal Grandmother   . Kidney disease Maternal Grandmother   . Hypertension Father   . Hyperlipidemia Father   . Fibromyalgia Mother   . Migraines Mother   . Cancer Mother 46       breast  . Ehlers-Danlos syndrome Sister   . Fibromyalgia Sister   . Thyroid cancer Sister   . Heart attack Paternal Grandfather   . Cancer Paternal Grandfather        Pancreatic/ Bladder Cancer/prostate  . Arthritis Paternal Grandfather   . Heart disease Paternal Grandfather   . Cancer Paternal Grandmother        Liver cancer/lung cancer  . Diabetes Paternal Grandmother   . Heart attack Maternal Grandfather   . Heart disease Maternal Grandfather   . Hypertension Maternal Grandfather   .  Mental illness Maternal Grandfather     The following portions of the patient's history were reviewed and updated as appropriate: allergies, current medications, past family history, past medical history, past social history,  past surgical history and problem list.  Review of Systems  ROS negative except as noted above. Information obtained from patient.    Objective:   BP 98/70   Pulse 100   Ht 5\' 4"  (1.626 m)   Wt 206 lb 8 oz (93.7 kg)   BMI 35.45 kg/m    CONSTITUTIONAL: Well-developed, well-nourished female in no acute distress.   PSYCHIATRIC: Normal mood and affect. Normal behavior. Normal judgment and thought content.  [place]: Alert and oriented to person, place, and time. Normal muscle tone coordination. No cranial  nerve deficit noted.  HENT:  Normocephalic, atraumatic, External right and left ear normal. Oropharynx is clear and moist  EYES: Conjunctivae and EOM are normal. Pupils are equal, round, and reactive to light. No scleral icterus.   NECK: Normal range of motion, supple, no masses.  Normal thyroid.   SKIN: Skin is warm and dry. No rash noted. Not diaphoretic. No erythema. No pallor.  CARDIOVASCULAR: Normal heart rate noted, regular rhythm, no murmur.  RESPIRATORY: Clear to auscultation bilaterally. Effort and breath sounds normal, no problems with respiration noted.  BREASTS: Symmetric in size. No masses, skin changes, nipple drainage, or lymphadenopathy.  ABDOMEN: Soft, normal bowel sounds, no distention noted.  No tenderness, rebound or guarding. Obese.   PELVIC:  External Genitalia: Normal  Vagina: Normal  Cervix: Normal, IUD strings present  Uterus: Normal  Adnexa: Normal   MUSCULOSKELETAL: Normal range of motion. No tenderness.  No cyanosis, clubbing, or edema.  2+ distal pulses.  LYMPHATIC: No Axillary, Supraclavicular, or Inguinal Adenopathy.  Assessment:   Annual gynecologic examination 37 y.o.   Contraception: IUD, Mirena   Obesity  1   Problem List Items Addressed This Visit      Other   Family history of thyroid cancer   Relevant Orders   Thyroid Panel With TSH    Other Visit Diagnoses    Well woman exam    -  Primary   Relevant Orders   CBC   Thyroid Panel With TSH   Lipid panel   Comprehensive metabolic panel   Hemoglobin A1c   HIV antibody   RPR   NuSwab Vaginitis Plus (VG+)   PapIG, HPV, rfx 16/18   Screening for cervical cancer       Relevant Orders   PapIG, HPV, rfx 16/18   BMI 35.0-35.9,adult       Relevant Orders   Thyroid Panel With TSH   Lipid panel   Hemoglobin A1c   Screening examination for STD (sexually transmitted disease)       Relevant Orders   HIV antibody   RPR   NuSwab Vaginitis Plus (VG+)      Plan:   Pap: Pap Co Test  Labs: See orders  Routine preventative health maintenance measures emphasized: Exercise/Diet/Weight control, Tobacco Warnings, Alcohol/Substance use risks and Stress Management; see AVS  Reviewed red flag symptoms and when to call  RTC x 1 year for Annual Exam or sooner if needed   Diona Fanti, CNM [facility], [facility]

## 2017-08-30 NOTE — Patient Instructions (Signed)
Levonorgestrel intrauterine device (IUD) What is this medicine? LEVONORGESTREL IUD (LEE voe nor jes trel) is a contraceptive (birth control) device. The device is placed inside the uterus by a healthcare professional. It is used to prevent pregnancy. This device can also be used to treat heavy bleeding that occurs during your period. This medicine may be used for other purposes; ask your health care provider or pharmacist if you have questions. COMMON BRAND NAME(S): Minette Headland What should I tell my health care provider before I take this medicine? They need to know if you have any of these conditions: -abnormal Pap smear -cancer of the breast, uterus, or cervix -diabetes -endometritis -genital or pelvic infection now or in the past -have more than one sexual partner or your partner has more than one partner -heart disease -history of an ectopic or tubal pregnancy -immune system problems -IUD in place -liver disease or tumor -problems with blood clots or take blood-thinners -seizures -use intravenous drugs -uterus of unusual shape -vaginal bleeding that has not been explained -an unusual or allergic reaction to levonorgestrel, other hormones, silicone, or polyethylene, medicines, foods, dyes, or preservatives -pregnant or trying to get pregnant -breast-feeding How should I use this medicine? This device is placed inside the uterus by a health care professional. Talk to your pediatrician regarding the use of this medicine in children. Special care may be needed. Overdosage: If you think you have taken too much of this medicine contact a poison control center or emergency room at once. NOTE: This medicine is only for you. Do not share this medicine with others. What if I miss a dose? This does not apply. Depending on the brand of device you have inserted, the device will need to be replaced every 3 to 5 years if you wish to continue using this type of birth  control. What may interact with this medicine? Do not take this medicine with any of the following medications: -amprenavir -bosentan -fosamprenavir This medicine may also interact with the following medications: -aprepitant -armodafinil -barbiturate medicines for inducing sleep or treating seizures -bexarotene -boceprevir -griseofulvin -medicines to treat seizures like carbamazepine, ethotoin, felbamate, oxcarbazepine, phenytoin, topiramate -modafinil -pioglitazone -rifabutin -rifampin -rifapentine -some medicines to treat HIV infection like atazanavir, efavirenz, indinavir, lopinavir, nelfinavir, tipranavir, ritonavir -St. John's wort -warfarin This list may not describe all possible interactions. Give your health care provider a list of all the medicines, herbs, non-prescription drugs, or dietary supplements you use. Also tell them if you smoke, drink alcohol, or use illegal drugs. Some items may interact with your medicine. What should I watch for while using this medicine? Visit your doctor or health care professional for regular check ups. See your doctor if you or your partner has sexual contact with others, becomes HIV positive, or gets a sexual transmitted disease. This product does not protect you against HIV infection (AIDS) or other sexually transmitted diseases. You can check the placement of the IUD yourself by reaching up to the top of your vagina with clean fingers to feel the threads. Do not pull on the threads. It is a good habit to check placement after each menstrual period. Call your doctor right away if you feel more of the IUD than just the threads or if you cannot feel the threads at all. The IUD may come out by itself. You may become pregnant if the device comes out. If you notice that the IUD has come out use a backup birth control method like condoms and call your  health care provider. Using tampons will not change the position of the IUD and are okay to use  during your period. This IUD can be safely scanned with magnetic resonance imaging (MRI) only under specific conditions. Before you have an MRI, tell your healthcare provider that you have an IUD in place, and which type of IUD you have in place. What side effects may I notice from receiving this medicine? Side effects that you should report to your doctor or health care professional as soon as possible: -allergic reactions like skin rash, itching or hives, swelling of the face, lips, or tongue -fever, flu-like symptoms -genital sores -high blood pressure -no menstrual period for 6 weeks during use -pain, swelling, warmth in the leg -pelvic pain or tenderness -severe or sudden headache -signs of pregnancy -stomach cramping -sudden shortness of breath -trouble with balance, talking, or walking -unusual vaginal bleeding, discharge -yellowing of the eyes or skin Side effects that usually do not require medical attention (report to your doctor or health care professional if they continue or are bothersome): -acne -breast pain -change in sex drive or performance -changes in weight -cramping, dizziness, or faintness while the device is being inserted -headache -irregular menstrual bleeding within first 3 to 6 months of use -nausea This list may not describe all possible side effects. Call your doctor for medical advice about side effects. You may report side effects to FDA at 1-800-FDA-1088. Where should I keep my medicine? This does not apply. NOTE: This sheet is a summary. It may not cover all possible information. If you have questions about this medicine, talk to your doctor, pharmacist, or health care provider.  2018 Elsevier/Gold Standard (2015-11-15 14:14:56) Preventive Care 18-39 Years, Female Preventive care refers to lifestyle choices and visits with your health care provider that can promote health and wellness. What does preventive care include?  A yearly physical exam.  This is also called an annual well check.  Dental exams once or twice a year.  Routine eye exams. Ask your health care provider how often you should have your eyes checked.  Personal lifestyle choices, including: ? Daily care of your teeth and gums. ? Regular physical activity. ? Eating a healthy diet. ? Avoiding tobacco and drug use. ? Limiting alcohol use. ? Practicing safe sex. ? Taking vitamin and mineral supplements as recommended by your health care provider. What happens during an annual well check? The services and screenings done by your health care provider during your annual well check will depend on your age, overall health, lifestyle risk factors, and family history of disease. Counseling Your health care provider may ask you questions about your:  Alcohol use.  Tobacco use.  Drug use.  Emotional well-being.  Home and relationship well-being.  Sexual activity.  Eating habits.  Work and work Statistician.  Method of birth control.  Menstrual cycle.  Pregnancy history.  Screening You may have the following tests or measurements:  Height, weight, and BMI.  Diabetes screening. This is done by checking your blood sugar (glucose) after you have not eaten for a while (fasting).  Blood pressure.  Lipid and cholesterol levels. These may be checked every 5 years starting at age 93.  Skin check.  Hepatitis C blood test.  Hepatitis B blood test.  Sexually transmitted disease (STD) testing.  BRCA-related cancer screening. This may be done if you have a family history of breast, ovarian, tubal, or peritoneal cancers.  Pelvic exam and Pap test. This may be done every 3  years starting at age 14. Starting at age 51, this may be done every 5 years if you have a Pap test in combination with an HPV test.  Discuss your test results, treatment options, and if necessary, the need for more tests with your health care provider. Vaccines Your health care provider  may recommend certain vaccines, such as:  Influenza vaccine. This is recommended every year.  Tetanus, diphtheria, and acellular pertussis (Tdap, Td) vaccine. You may need a Td booster every 10 years.  Varicella vaccine. You may need this if you have not been vaccinated.  HPV vaccine. If you are 67 or younger, you may need three doses over 6 months.  Measles, mumps, and rubella (MMR) vaccine. You may need at least one dose of MMR. You may also need a second dose.  Pneumococcal 13-valent conjugate (PCV13) vaccine. You may need this if you have certain conditions and were not previously vaccinated.  Pneumococcal polysaccharide (PPSV23) vaccine. You may need one or two doses if you smoke cigarettes or if you have certain conditions.  Meningococcal vaccine. One dose is recommended if you are age 14-21 years and a first-year college student living in a residence hall, or if you have one of several medical conditions. You may also need additional booster doses.  Hepatitis A vaccine. You may need this if you have certain conditions or if you travel or work in places where you may be exposed to hepatitis A.  Hepatitis B vaccine. You may need this if you have certain conditions or if you travel or work in places where you may be exposed to hepatitis B.  Haemophilus influenzae type b (Hib) vaccine. You may need this if you have certain risk factors.  Talk to your health care provider about which screenings and vaccines you need and how often you need them. This information is not intended to replace advice given to you by your health care provider. Make sure you discuss any questions you have with your health care provider. Document Released: 03/31/2001 Document Revised: 10/23/2015 Document Reviewed: 12/04/2014 Elsevier Interactive Patient Education  [employer].

## 2017-08-31 LAB — COMPREHENSIVE METABOLIC PANEL
A/G RATIO: 1.8 (ref 1.2–2.2)
ALBUMIN: 4.3 g/dL (ref 3.5–5.5)
ALK PHOS: 96 IU/L (ref 39–117)
ALT: 14 IU/L (ref 0–32)
AST: 11 IU/L (ref 0–40)
BUN / CREAT RATIO: 15 (ref 9–23)
BUN: 14 mg/dL (ref 6–20)
Bilirubin Total: <0.2 mg/dL (ref 0.0–1.2)
CALCIUM: 9.8 mg/dL (ref 8.7–10.2)
CO2: 26 mmol/L (ref 20–29)
CREATININE: 0.95 mg/dL (ref 0.57–1.00)
Chloride: 100 mmol/L (ref 96–106)
GFR calc Af Amer: 89 mL/min/{1.73_m2} (ref >59)
GFR, EST NON AFRICAN AMERICAN: 77 mL/min/{1.73_m2} (ref >59)
GLOBULIN, TOTAL: 2.4 g/dL (ref 1.5–4.5)
Glucose: 77 mg/dL (ref 65–99)
POTASSIUM: 4.9 mmol/L (ref 3.5–5.2)
SODIUM: 140 mmol/L (ref 134–144)
Total Protein: 6.7 g/dL (ref 6.0–8.5)

## 2017-08-31 LAB — THYROID PANEL WITH TSH
Free Thyroxine Index: 1.2 (ref 1.2–4.9)
T3 UPTAKE RATIO: 22 % — AB (ref 24–39)
T4 TOTAL: 5.5 ug/dL (ref 4.5–12.0)
TSH: 3.47 u[IU]/mL (ref 0.450–4.500)

## 2017-08-31 LAB — CBC
HEMATOCRIT: 41.3 % (ref 34.0–46.6)
HEMOGLOBIN: 13.8 g/dL (ref 11.1–15.9)
MCH: 30.6 pg (ref 26.6–33.0)
MCHC: 33.4 g/dL (ref 31.5–35.7)
MCV: 92 fL (ref 79–97)
Platelets: 404 10*3/uL (ref 150–450)
RBC: 4.51 x10E6/uL (ref 3.77–5.28)
RDW: 13.1 % (ref 12.3–15.4)
WBC: 7.1 10*3/uL (ref 3.4–10.8)

## 2017-08-31 LAB — LIPID PANEL
CHOLESTEROL TOTAL: 212 mg/dL — AB (ref 100–199)
Chol/HDL Ratio: 4.7 ratio — ABNORMAL HIGH (ref 0.0–4.4)
HDL: 45 mg/dL (ref >39)
LDL CALC: 135 mg/dL — AB (ref 0–99)
TRIGLYCERIDES: 162 mg/dL — AB (ref 0–149)
VLDL CHOLESTEROL CAL: 32 mg/dL (ref 5–40)

## 2017-08-31 LAB — RPR: RPR Ser Ql: NONREACTIVE

## 2017-08-31 LAB — HIV ANTIBODY (ROUTINE TESTING W REFLEX): HIV Screen 4th Generation wRfx: NONREACTIVE

## 2017-08-31 LAB — HEMOGLOBIN A1C
ESTIMATED AVERAGE GLUCOSE: 123 mg/dL
Hgb A1c MFr Bld: 5.9 % — ABNORMAL HIGH (ref 4.8–5.6)

## 2017-09-02 LAB — PAPIG, HPV, RFX 16/18
HPV, HIGH-RISK: NEGATIVE
PAP Smear Comment: 0

## 2017-09-03 LAB — NUSWAB VAGINITIS PLUS (VG+)
ATOPOBIUM VAGINAE: HIGH {score} — AB
BVAB 2: HIGH Score — AB
Candida albicans, NAA: NEGATIVE
Candida glabrata, NAA: NEGATIVE
Chlamydia trachomatis, NAA: NEGATIVE
Megasphaera 1: HIGH Score — AB
NEISSERIA GONORRHOEAE, NAA: NEGATIVE
Trich vag by NAA: NEGATIVE

## 2017-09-09 ENCOUNTER — Other Ambulatory Visit: Payer: Self-pay | Admitting: Family Medicine

## 2017-09-09 DIAGNOSIS — B009 Herpesviral infection, unspecified: Secondary | ICD-10-CM

## 2017-09-09 NOTE — Telephone Encounter (Unsigned)
Copied from [place] [id number]. Topic: Quick Communication - See Telephone Encounter >> Sep 09, 2017  1:47 PM Neva Seat wrote: Pt's insurance is  now through [employer].  When they faxed a request for pt Rx information they were told pt is not a pt at the [facility]. Pt is now out of her medications.  She needs all future medications below to go through [facility] in [place] as 90 day refills.   *90 day refills DULoxetine (CYMBALTA) 30 MG capsule zolpidem (AMBIEN CR) 6.25 MG CR tablet  valACYclovir (VALTREX) 500 MG tablet omeprazole (PRILOSEC) 20 MG capsule  [facility] Address: [place], [place], [place] [address] Phone: [phone]

## 2017-09-10 ENCOUNTER — Telehealth: Payer: Self-pay | Admitting: Family Medicine

## 2017-09-10 MED ORDER — ZOLPIDEM TARTRATE ER 6.25 MG PO TBCR: EXTENDED_RELEASE_TABLET | ORAL | 0 refills | Status: DC

## 2017-09-10 MED ORDER — DULOXETINE HCL 30 MG PO CPEP: ORAL_CAPSULE | ORAL | 1 refills | Status: DC

## 2017-09-10 MED ORDER — OMEPRAZOLE 20 MG PO CPDR
20.0000 mg | DELAYED_RELEASE_CAPSULE | Freq: Every day | ORAL | 3 refills | Status: DC
Start: 2017-09-10 — End: 2018-10-28

## 2017-09-10 MED ORDER — VALACYCLOVIR HCL 500 MG PO TABS: 500.0000 mg | ORAL_TABLET | Freq: Every day | ORAL | 0 refills | Status: DC

## 2017-09-10 NOTE — Telephone Encounter (Signed)
Last OV 05/19/17 last filled 05/19/17 30 2rf Patient would like 90 day refill I have resent all other medications

## 2017-09-10 NOTE — Telephone Encounter (Signed)
Controlled substance database reviewed.  She had the Ambien refilled on 08/22/2017.  She should not be due for refill yet.  I will send a refill in to be filled on 09/22/2017.  Please inform her of this.

## 2017-09-10 NOTE — Telephone Encounter (Signed)
Copied from [place] [id number]. Topic: Quick Communication - Rx Refill/Question >> Sep 10, 2017 11:14 AM Mcneil, Ja-Kwan wrote: Medication: DULoxetine (CYMBALTA) 30 MG capsule  [facility] - [place], [place] - [address] states Rx was not received. Requests that the Rx be resubmitted.

## 2017-09-10 NOTE — Telephone Encounter (Signed)
rx refill

## 2017-09-13 ENCOUNTER — Ambulatory Visit: Payer: Self-pay | Admitting: Internal Medicine

## 2017-09-13 ENCOUNTER — Encounter: Payer: Self-pay | Admitting: Internal Medicine

## 2017-09-13 ENCOUNTER — Ambulatory Visit (INDEPENDENT_AMBULATORY_CARE_PROVIDER_SITE_OTHER): Payer: 59

## 2017-09-13 ENCOUNTER — Ambulatory Visit: Payer: 59 | Admitting: Internal Medicine

## 2017-09-13 VITALS — BP 112/78 | HR 90 | Temp 98.5°F | Resp 15 | Ht 64.0 in | Wt 207.2 lb

## 2017-09-13 DIAGNOSIS — M545 Low back pain, unspecified: Secondary | ICD-10-CM

## 2017-09-13 DIAGNOSIS — G8911 Acute pain due to trauma: Secondary | ICD-10-CM

## 2017-09-13 LAB — POCT URINE PREGNANCY: Preg Test, Ur: NEGATIVE

## 2017-09-13 MED ORDER — CELECOXIB 200 MG PO CAPS: 200.0000 mg | ORAL_CAPSULE | Freq: Two times a day (BID) | ORAL | 0 refills | Status: DC

## 2017-09-13 MED ORDER — DIAZEPAM 10 MG PO TABS: 10.0000 mg | ORAL_TABLET | Freq: Three times a day (TID) | ORAL | 1 refills | Status: DC | PRN

## 2017-09-13 NOTE — Telephone Encounter (Signed)
Already refilled in another encounter
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
## 2017-09-13 NOTE — Patient Instructions (Addendum)
celebrex 200 mg twice daily for one week,  Then reduce use to once daily  instead of ibuprofen   Ok to add tylenol 500 mg every 6 hours  Ok to to use up  to 2000 mg daily   Use the valium as a muscle relaxer  Start with 1/2 tablet   and repeat in 30 to 45 minutes if no relief.     Plain films today  To evaluate our alignment

## 2017-09-14 DIAGNOSIS — M545 Low back pain, unspecified: Secondary | ICD-10-CM | POA: Insufficient documentation

## 2017-09-14 DIAGNOSIS — G8911 Acute pain due to trauma: Secondary | ICD-10-CM | POA: Insufficient documentation

## 2017-09-14 NOTE — Assessment & Plan Note (Addendum)
Plain films were negative  For fractures, alignment issues and disk space narrowing. Changing ibuprofen to celebrex,  adding tylenol
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
## 2017-09-21 ENCOUNTER — Telehealth: Payer: Self-pay

## 2017-09-21 NOTE — Telephone Encounter (Signed)
The patient will need to call billing to get her statemen.t
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
## 2017-09-21 NOTE — Telephone Encounter (Signed)
Copy of CD placed in folder up front for pt to pick up

## 2017-09-21 NOTE — Telephone Encounter (Signed)
Patient requesting copy of xray put on disc from 09-13-17. Will pick up on 09-22-17.

## 2017-10-07 ENCOUNTER — Telehealth: Payer: Self-pay | Admitting: Certified Nurse Midwife

## 2017-10-07 ENCOUNTER — Telehealth: Payer: Self-pay

## 2017-10-07 NOTE — Telephone Encounter (Signed)
pts call- she has mychart and those results were released with providers instructions on 09/04/17. Pt informed. She will check mychart and notify us if she has any other questions or concerns.

## 2017-10-07 NOTE — Telephone Encounter (Signed)
The patient called and stated that she has yet to get her results back yet and would like to have a nurse call her back with an update. Please advise.  Pt stated that it is okay to leave a voicemail
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
## 2017-10-14 ENCOUNTER — Other Ambulatory Visit (INDEPENDENT_AMBULATORY_CARE_PROVIDER_SITE_OTHER): Payer: Self-pay | Admitting: Radiology

## 2017-10-14 ENCOUNTER — Encounter (INDEPENDENT_AMBULATORY_CARE_PROVIDER_SITE_OTHER): Payer: Self-pay | Admitting: Orthopaedic Surgery

## 2017-10-14 ENCOUNTER — Ambulatory Visit (INDEPENDENT_AMBULATORY_CARE_PROVIDER_SITE_OTHER): Payer: 59 | Admitting: Orthopaedic Surgery

## 2017-10-14 VITALS — BP 117/89 | HR 87 | Ht 64.0 in | Wt 208.0 lb

## 2017-10-14 DIAGNOSIS — M545 Low back pain, unspecified: Secondary | ICD-10-CM

## 2017-10-14 DIAGNOSIS — G8929 Other chronic pain: Secondary | ICD-10-CM
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
## 2017-10-19 ENCOUNTER — Ambulatory Visit: Payer: 59 | Admitting: Rheumatology

## 2017-10-19 ENCOUNTER — Ambulatory Visit: Payer: Self-pay | Admitting: Neurology
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
## 2017-10-20 ENCOUNTER — Encounter: Payer: Self-pay | Admitting: Neurology

## 2017-10-20 ENCOUNTER — Ambulatory Visit: Payer: 59 | Admitting: Neurology

## 2017-10-20 ENCOUNTER — Ambulatory Visit: Payer: Self-pay | Admitting: Neurology

## 2017-10-20 ENCOUNTER — Other Ambulatory Visit: Payer: Self-pay | Admitting: Internal Medicine

## 2017-10-20 VITALS — BP 115/81 | HR 85 | Ht 64.0 in | Wt 217.0 lb

## 2017-10-20 DIAGNOSIS — G43009 Migraine without aura, not intractable, without status migrainosus: Secondary | ICD-10-CM

## 2017-10-20 DIAGNOSIS — G43909 Migraine, unspecified, not intractable, without status migrainosus: Secondary | ICD-10-CM | POA: Diagnosis not present

## 2017-10-20 DIAGNOSIS — G43709 Chronic migraine without aura, not intractable, without status migrainosus: Secondary | ICD-10-CM | POA: Diagnosis not present

## 2017-10-20 MED ORDER — DIVALPROEX SODIUM 500 MG PO DR TAB: 500.0000 mg | DELAYED_RELEASE_TABLET | Freq: Every day | ORAL | 4 refills | Status: DC

## 2017-10-20 MED ORDER — AMITRIPTYLINE HCL 50 MG PO TABS: 50.0000 mg | ORAL_TABLET | Freq: Every day | ORAL | 4 refills | Status: DC

## 2017-10-20 NOTE — Progress Notes (Signed)
[facility]    Provider:  Dr Jaynee Eagles Referring Provider: Leone Haven, MD Primary Care Physician:  Leone Haven, MD  CC:  Headaches  Interval history: Tried botox and had reactions(difficulty swallowing, shortness of breath, sore throat, pulse in the 100's , cough), tried Depakote in the past (she cannot get pregnant), She has used onzetra in the past and worked well and cambia works well.  She has a new job. She is still on Depakote. Migraines are still ongoing. She would like a screen protector note for her computer bc it is causing her migraines. Her migraines are improved. 4 migraine days a month treated with excedrin and sometimes cambia. Continue the Cambia and try to get the [place]. Refill medications.   HPI:  TRIANNA LUPIEN is a 37 y.o. female here as a referral from Dr. Caryl Bis for headaches. Past medical history migraines, fibromyalgia, interstitial cystitis, diverticulitis, endometriosis, ADHD, dyslexia, restless legs, bursitis, plantar fasciitis, IBS. Migraines Since a teenager. She is on Cymbalta which helps. She works around a lot of dyes which have exacerbated her migraines. Migraines are on the right side. It is sharp, pounding, going up the neck on the back, sometimes the other side. Lots of sound and light sensitivity. Lots of nausea. Occurs several times a week, lasts all day. She has headaches every day, 30 headache days a month, At least 8 are bad migraine days. Has tried TCA, verapamil,propanolol. She is in the lab now which may be better as less triggers. Cymbalta was just increased. She takes Tylenol 3-4x a week only, no medication overuse . Shutting herslef off in a dark room helps. No aura. Computer overuse makes it worse. She takes nothing for the nausea. Sumatriptan used to work not anymore. Zomig nasal and the cambia helped. Sometimes the migraines are immediate but usually a more gradual increase in severity. The low-level headaches  are usually as she go to work every day. The headaches are at the base of the skull, all day long, 1-2 level, She does not take the robaxin. No other focal neurologic deficits or complaints.  Currently: cymbalta, imitrex, on Depakote, topamax.  Has tried verapamil, propranolol, amitriptyline in the past. She has also tried naproxen, ibuprofen, indomethacin.  Reviewed notes, labs and imaging from outside physicians, which showed: Reviewed notes from [facility]ology. Patient has fibromyalgia syndrome. Fibromyalgia has been active. Her generalized pain scale of 0-10 has been about 6-8 in her fatigue has been about 7. She has plantar fasciitis in her left foot going for a few months now. She had cortisone injections, podiatrist. She also had physical therapy which she has not done yet that she has been doing some splinting at night also some exercises without much result. She went on a mission trip to [place] and since then has been having pain in her left hip and bilateral knee joints without joint swelling. She has a history of morning stiffness, discomfort walking and doing routine activities. She is 16 out of 18 points positive on her fibromyalgia. Her fibromyalgia is active with generalized pain and positive tender points. She has mild osteoarthritis. She is overweight. Weight loss, diet, exercise and muscle strengthening exercises were discussed. IT band exercises and muscle strengthening exercises were discussed. She was referred to physical therapy for plantar fasciitis and left trochanteric bursitis. She also has insomnia, history of IBS, bilateral trapezius muscle spasms.  Reviewed image reports. X-rays of left hip joint 2 views, were within normal limits. X-rays of bilateral  knee joints, 2 views, showed mild medial compartment narrowing and some lateral osteophytes with mild patellofemoral narrowing without any chondrocalcinosis.  Reviewed lab reports, CMP was normal with normal  creatinine 0.94. This was taken on January 2016.  Cholesterol high T10, LDL elevated at 133, TSH within normal limits, CBC normal.  Review of Systems: Patient complains of symptoms per HPI as well as the following symptoms: Fatigue, chest pain, palpitations, blurred vision, shortness of breath, wheezing, snoring, feeling cold, joint pain, aching muscles, allergies, frequent infection, headache, numbness, weakness, dizziness, insomnia, sleepiness, restless legs. Pertinent negatives per HPI. All others negative.   Social History   Socioeconomic History  . Marital status: Divorced    Spouse name: Not on file  . Number of children: 0  . Years of education: 6   . Highest education level: Not on file  Occupational History  . Occupation: [employer]   Social Needs  . Financial resource strain: Not on file  . Food insecurity:    Worry: Not on file    Inability: Not on file  . Transportation needs:    Medical: Not on file    Non-medical: Not on file  Tobacco Use  . Smoking status: Never Smoker  . Smokeless tobacco: Never Used  Substance and Sexual Activity  . Alcohol use: Yes    Alcohol/week: 0.0 standard drinks    Comment: occasionally  . Drug use: No  . Sexual activity: Yes    Partners: Male    Birth control/protection: IUD  Lifestyle  . Physical activity:    Days per week: Not on file    Minutes per session: Not on file  . Stress: Not on file  Relationships  . Social connections:    Talks on phone: Not on file    Gets together: Not on file    Attends religious service: Not on file    Active member of club or organization: Not on file    Attends meetings of clubs or organizations: Not on file    Relationship status: Not on file  . Intimate partner violence:    Fear of current or ex partner: Not on file    Emotionally abused: Not on file    Physically abused: Not on file    Forced sexual activity: Not on file  Other Topics Concern  . Not on file  Social  History Narrative   Lives with dog   Caffeine use:  Generally when she has a headache, probably less than a weekly    Family History  Problem Relation Age of Onset  . Diabetes Maternal Grandmother   . Arthritis Maternal Grandmother   . Kidney disease Maternal Grandmother   . Hypertension Father   . Hyperlipidemia Father   . Fibromyalgia Mother   . Migraines Mother   . Cancer Mother 65       breast  . Ehlers-Danlos syndrome Sister   . Fibromyalgia Sister   . Thyroid cancer Sister   . Heart attack Paternal Grandfather   . Cancer Paternal Grandfather        Pancreatic/ Bladder Cancer/prostate  . Arthritis Paternal Grandfather   . Heart disease Paternal Grandfather   . Cancer Paternal Grandmother        Liver cancer/lung cancer  . Diabetes Paternal Grandmother   . Heart attack Maternal Grandfather   . Heart disease Maternal Grandfather   . Hypertension Maternal Grandfather   . Mental illness Maternal Grandfather     Past Medical History:  Diagnosis Date  . Asthma   . Bursitis of left hip   . Diverticulitis    h/o  . Endometriosis   . GERD (gastroesophageal reflux disease)   . Headache   . History of chicken pox   . History of colonoscopy 11/04 and April 06  . History of laparoscopy 12/04 and 12/07  . History of laparoscopy 01/29/2006   Dr. Georga Bora Endometriosis  . Hx of migraines   . Hx: UTI (urinary tract infection)   . Interstitial cystitis 01/03/2004   hydrodistilation  . Jaundice    h/o  . Plantar fasciitis, bilateral   . 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 CYSTOSCOPY WITH HYDRODISTENSION AND BIOPSY    . DIAGNOSTIC LAPAROSCOPY  02/16/2003  . SIGMOIDOSCOPY  05/26/10    Current Outpatient Medications  Medication Sig Dispense Refill  . amitriptyline (ELAVIL) 25 MG tablet Take 2 tablets (50 mg total) by mouth at bedtime. 60 tablet 11  . celecoxib (CELEBREX) 200  MG capsule Take 1 capsule (200 mg total) by mouth 2 (two) times daily. 60 capsule 0  . Cholecalciferol (VITAMIN D PO) Take 5,000 Int'l Units by mouth once a week.    . diazepam (VALIUM) 10 MG tablet Take 1 tablet (10 mg total) by mouth every 8 (eight) hours as needed for anxiety (muscle spasm). 20 tablet 1  . diclofenac sodium (VOLTAREN) 1 % GEL Apply 4 g topically 3 (three) times daily as needed.     . divalproex (DEPAKOTE) 500 MG DR tablet TAKE 1 TABLET (500 MG TOTAL) BY MOUTH DAILY. PLEASE KEEP PENDING APPT TO CONTINUE REFILLS. 30 tablet 1  . DULoxetine (CYMBALTA) 30 MG capsule Take 3 capsules by mouth once a daily 270 capsule 1  . gentamicin cream (GARAMYCIN) 0.1 % Apply 1 application topically as needed (for nose).    Marland Kitchen levonorgestrel (MIRENA) 20 MCG/24HR IUD 1 each by Intrauterine route once.      . loratadine (CLARITIN) 10 MG tablet TAKE 1 TABLET (10 MG TOTAL) BY MOUTH DAILY. 90 tablet 3  . omeprazole (PRILOSEC) 20 MG capsule Take 1 capsule (20 mg total) by mouth daily. 90 capsule 3  . ondansetron (ZOFRAN-ODT) 4 MG disintegrating tablet Take 1 tablet (4 mg total) by mouth every 8 (eight) hours as needed for nausea. 30 tablet 12  . PRESCRIPTION MEDICATION Place under the tongue daily. Allergy drops    . SUMAtriptan (IMITREX) 100 MG tablet TAKE 1 TABLET BY MOUTH ONCE AS NEEDED -- MAY TAKE SECOND DOSE AFTER 2 HOURS IF NEEDED  0  . Tavaborole (KERYDIN) 5 % SOLN Apply 1 application topically as needed.    . valACYclovir (VALTREX) 500 MG tablet Take 1 tablet (500 mg total) by mouth daily. 90 tablet 0  . zolpidem (AMBIEN CR) 6.25 MG CR tablet TAKE 1 TABLET BY MOUTH AT BEDTIME AS NEEDED SLEEP 90 tablet 0  . SUMAtriptan Succinate (ONZETRA XSAIL) 11 MG/NOSEPC EXHP Place 1 Dose into both nostrils once for 1 dose. 1 spray per nostril  at onset of migraine, may repeat dose after 2 hours. Max twice daily. (Patient not taking: Reported on 10/20/2017) 1 each 11   No current facility-administered medications  for this visit.     Allergies as of 10/20/2017 - Review Complete 10/20/2017  Allergen Reaction Noted  . Coffee bean extract  [coffea arabica] Nausea Only 09/04/2014  . Other  08/12/2015  .  Trazodone and nefazodone Other (See Comments) 08/03/2013    Vitals: BP 115/81 (BP Location: Left Arm, Patient Position: Sitting)   Pulse 85   Ht 5\' 4"  (1.626 m)   Wt 217 lb (98.4 kg)   BMI 37.25 kg/m  Last Weight:  Wt Readings from Last 1 Encounters:  10/20/17 217 lb (98.4 kg)   Last Height:   Ht Readings from Last 1 Encounters:  10/20/17 5\' 4"  (1.626 m)   Physical exam: Exam: Gen: NAD, conversant, well nourised,  well groomed                     CV: RRR, no MRG. No Carotid Bruits. No peripheral edema, warm, nontender Eyes: Conjunctivae clear without exudates or hemorrhage  Neuro: Detailed Neurologic Exam  Speech:    Speech is normal; fluent and spontaneous with normal comprehension.  Cognition:    The patient is oriented to person, place, and time;     recent and remote memory intact;     language fluent;     normal attention, concentration,     fund of knowledge Cranial Nerves:    The pupils are equal, round, and reactive to light. The fundi are normal and spontaneous venous pulsations are present. Visual fields are full to finger confrontation. Extraocular movements are intact. Trigeminal sensation is intact and the muscles of mastication are normal. The face is symmetric. The palate elevates in the midline. Hearing intact. Voice is normal. Shoulder shrug is normal. The tongue has normal motion without fasciculations.   Coordination:    Normal finger to nose and heel to shin. Normal rapid alternating movements.   Gait:    Heel-toe and tandem gait are normal.   Motor Observation:    No asymmetry, no atrophy, and no involuntary movements noted. Tone:    Normal muscle tone.    Posture:    Posture is normal. normal erect    Strength:    Strength is V/V in the upper and  lower limbs.      Sensation: intact to LT     Reflex Exam:  DTR's:    Deep tendon reflexes in the upper and lower extremities are normal bilaterally.   Toes:    The toes are downgoing bilaterally.   Clonus:    Clonus is absent.       Assessment/Plan:  37 year old with chronic migraines, non-intractable, without status migrainosus, without aura.  Acute management migraines: Onzetra, cambia, zofran (refill) Preventative: Depakote, Amitriptyline at night, may help with migraines as well as fibromyalgia and insomnia.  Meds ordered this encounter  Medications  . amitriptyline (ELAVIL) 50 MG tablet    Sig: Take 1 tablet (50 mg total) by mouth at bedtime.    Dispense:  90 tablet    Refill:  4    PLEASE SCHEDULE APPT FOR FURTHER REFILLS  . divalproex (DEPAKOTE) 500 MG DR tablet    Sig: Take 1 tablet (500 mg total) by mouth daily.    Dispense:  90 tablet    Refill:  4    Patient has pending appt on 03/17/17.  Marland Kitchen SUMAtriptan Succinate (ONZETRA XSAIL) 11 MG/NOSEPC EXHP    Sig: Place 1 Dose into both nostrils once for 1 dose. 1 spray per nostril  at onset of migraine, may repeat dose after 2 hours. Max twice daily.    Dispense:  1 each    Refill:  11    Dispense: 1 box #16 (8 doses)  . Diclofenac Potassium 50 MG  PACK    Sig: Take 50 mg by mouth once as needed. For migraine.    Dispense:  9 each    Refill:  11    Patient has tried: imitrex,maxalt,onzetra,diclofenac pills, ibuprofen, tylenol, alleve,    To prevent or relieve headaches, try the following: Cool Compress. Lie down and place a cool compress on your head.  Avoid headache triggers. If certain foods or odors seem to have triggered your migraines in the past, avoid them. A headache diary might help you identify triggers.  Include physical activity in your daily routine. Try a daily walk or other moderate aerobic exercise.  Manage stress. Find healthy ways to cope with the stressors, such as delegating tasks on your to-do  list.  Practice relaxation techniques. Try deep breathing, yoga, massage and visualization.  Eat regularly. Eating regularly scheduled meals and maintaining a healthy diet might help prevent headaches. Also, drink plenty of fluids.  Follow a regular sleep schedule. Sleep deprivation might contribute to headaches Consider biofeedback. With this mind-body technique, you learn to control certain bodily functions - such as muscle tension, heart rate and blood pressure - to prevent headaches or reduce headache pain.    Proceed to emergency room if you experience new or worsening symptoms or symptoms do not resolve, if you have new neurologic symptoms or if headache is severe, or for any concerning symptom.   Cc: Dr. Irine Seal, MD  [facility] [address] [place] [employer], [place] [address]  Phone [phone] Fax [phone]  A total of 25 minutes was spent face-to-face with this patient. Over half this time was spent on counseling patient on the  1. Chronic migraine w/o aura w/o status migrainosus, not intractable   2. Acute confusional migraine   3. Migraine without aura and without status migrainosus, not intractable     diagnosis and different diagnostic and therapeutic options, counseling and coordination of care, risks ans benefits of management, compliance, or risk factor reduction and education.

## 2017-10-21 ENCOUNTER — Telehealth: Payer: Self-pay | Admitting: Certified Nurse Midwife

## 2017-10-21 ENCOUNTER — Other Ambulatory Visit: Payer: Self-pay | Admitting: Certified Nurse Midwife

## 2017-10-21 MED ORDER — DICLOFENAC POTASSIUM(MIGRAINE) 50 MG PO PACK: 50.0000 mg | PACK | Freq: Once | ORAL | 11 refills | Status: DC | PRN

## 2017-10-21 MED ORDER — METRONIDAZOLE 500 MG PO TABS: 500.0000 mg | ORAL_TABLET | Freq: Two times a day (BID) | ORAL | 0 refills | Status: AC

## 2017-10-21 MED ORDER — SUMATRIPTAN SUCCINATE 11 MG/NOSEPC NA EXHP: 1.0000 | INHALANT_POWDER | Freq: Once | NASAL | 11 refills | Status: DC

## 2017-10-21 NOTE — Telephone Encounter (Signed)
pls advise

## 2017-10-21 NOTE — Telephone Encounter (Signed)
The patient is asking to go with the pill form for her infection if that is an option; oral antibiotic x1 week per provider on last visit, and she is also asking to use the [place], please advise, thanks.

## 2017-10-21 NOTE — Progress Notes (Signed)
fl

## 2017-10-21 NOTE — Telephone Encounter (Signed)
Rx sent to pharmacy on file, see orders. Thanks, JML

## 2017-11-01 ENCOUNTER — Ambulatory Visit: Payer: 59 | Admitting: Physician Assistant

## 2017-11-01 ENCOUNTER — Encounter: Payer: Self-pay | Admitting: Physician Assistant

## 2017-11-01 VITALS — BP 142/86 | HR 80 | Resp 15 | Ht 64.0 in | Wt 219.2 lb

## 2017-11-01 DIAGNOSIS — F5104 Psychophysiologic insomnia: Secondary | ICD-10-CM

## 2017-11-01 DIAGNOSIS — M797 Fibromyalgia: Secondary | ICD-10-CM | POA: Diagnosis not present

## 2017-11-01 DIAGNOSIS — R5383 Other fatigue: Secondary | ICD-10-CM | POA: Diagnosis not present

## 2017-11-01 DIAGNOSIS — M7062 Trochanteric bursitis, left hip: Secondary | ICD-10-CM

## 2017-11-01 DIAGNOSIS — K588 Other irritable bowel syndrome: Secondary | ICD-10-CM

## 2017-11-01 DIAGNOSIS — Z8659 Personal history of other mental and behavioral disorders: Secondary | ICD-10-CM

## 2017-11-01 DIAGNOSIS — M62838 Other muscle spasm: Secondary | ICD-10-CM

## 2017-11-01 DIAGNOSIS — Z8669 Personal history of other diseases of the nervous system and sense organs: Secondary | ICD-10-CM

## 2017-11-01 DIAGNOSIS — Z87440 Personal history of urinary (tract) infections: Secondary | ICD-10-CM

## 2017-11-01 DIAGNOSIS — Z8679 Personal history of other diseases of the circulatory system: Secondary | ICD-10-CM

## 2017-11-01 MED ORDER — ARMODAFINIL 50 MG PO TABS: 50.0000 mg | ORAL_TABLET | Freq: Every day | ORAL | 1 refills | Status: DC

## 2017-11-09 ENCOUNTER — Ambulatory Visit: Payer: 59 | Attending: Orthopaedic Surgery

## 2017-11-09 DIAGNOSIS — M6281 Muscle weakness (generalized): Secondary | ICD-10-CM | POA: Insufficient documentation

## 2017-11-09 DIAGNOSIS — M545 Low back pain: Secondary | ICD-10-CM | POA: Insufficient documentation

## 2017-11-09 DIAGNOSIS — G8929 Other chronic pain: Secondary | ICD-10-CM
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
## 2017-11-16 ENCOUNTER — Ambulatory Visit: Payer: 59 | Attending: Orthopaedic Surgery

## 2017-11-16 DIAGNOSIS — M545 Low back pain, unspecified: Secondary | ICD-10-CM

## 2017-11-16 DIAGNOSIS — G8929 Other chronic pain: Secondary | ICD-10-CM | POA: Diagnosis present

## 2017-11-16 DIAGNOSIS — M6281 Muscle weakness (generalized): Secondary | ICD-10-CM | POA: Diagnosis present
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
## 2017-11-18 ENCOUNTER — Ambulatory Visit: Payer: Self-pay | Admitting: Family Medicine

## 2017-11-18 ENCOUNTER — Telehealth: Payer: Self-pay

## 2017-11-18 NOTE — Telephone Encounter (Signed)
Pending approval for Cambia 50 mg packets  Key: [id number] PA Case ID: [id number] ICD 10 Code: G98.473 [facility].

## 2017-11-22 NOTE — Telephone Encounter (Addendum)
Cambia was denied. Case number: [id number]. Spoke with Sunday Spillers at [facility]. They have the copay program in place where patient can get 4 packs of Cambia for $20 despite denial. She stated pt declined it last month. They will call patient again to see if she wants it this time. She stated pt said meds have to be sent to [facility] but they will explain to patient that due to the program it will be a cash payment so it can be filled at their pharmacy.   Dr. Jaynee Eagles aware of denial but that pt still has access to medication via copay program.

## 2017-11-23 ENCOUNTER — Ambulatory Visit: Payer: 59

## 2017-11-23 DIAGNOSIS — M6281 Muscle weakness (generalized): Secondary | ICD-10-CM

## 2017-11-23 DIAGNOSIS — G8929 Other chronic pain: Secondary | ICD-10-CM

## 2017-11-23 DIAGNOSIS — M545 Low back pain, unspecified: Secondary | ICD-10-CM
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
## 2017-11-24 ENCOUNTER — Ambulatory Visit: Payer: 59 | Admitting: Family Medicine

## 2017-11-24 ENCOUNTER — Encounter: Payer: Self-pay | Admitting: Family Medicine

## 2017-11-24 DIAGNOSIS — F3340 Major depressive disorder, recurrent, in remission, unspecified: Secondary | ICD-10-CM | POA: Diagnosis not present

## 2017-11-24 DIAGNOSIS — M545 Low back pain, unspecified: Secondary | ICD-10-CM

## 2017-11-24 DIAGNOSIS — F419 Anxiety disorder, unspecified: Secondary | ICD-10-CM

## 2017-11-24 DIAGNOSIS — K219 Gastro-esophageal reflux disease without esophagitis: Secondary | ICD-10-CM

## 2017-11-24 DIAGNOSIS — M799 Soft tissue disorder, unspecified: Secondary | ICD-10-CM

## 2017-11-24 DIAGNOSIS — F5104 Psychophysiologic insomnia: Secondary | ICD-10-CM

## 2017-11-24 DIAGNOSIS — G8911 Acute pain due to trauma: Secondary | ICD-10-CM
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
## 2017-11-24 NOTE — Assessment & Plan Note (Signed)
Seems to be smaller.  Patient has deferred further work-up at this time.  If enlarges or changes she will let us know.

## 2017-11-24 NOTE — Assessment & Plan Note (Signed)
Well-controlled.  Continue current regimen. 

## 2017-11-24 NOTE — Assessment & Plan Note (Signed)
Well-controlled.  Advised not to take Valium given she takes Ambien.
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
## 2017-11-24 NOTE — Patient Instructions (Signed)
Nice to see you. Please monitor anxiety and depression and let us know if it worsens. Please monitor your low back and if it is worsening please be reevaluated. Please monitor the spot on your left thigh and if it enlarges or becomes bothersome please let us know.

## 2017-11-24 NOTE — Assessment & Plan Note (Signed)
No depression.  Continue current regimen.

## 2017-11-24 NOTE — Assessment & Plan Note (Signed)
Doing well.  She will continue PT.  She will discontinue Valium.  Continue Celebrex as needed.

## 2017-11-25 ENCOUNTER — Ambulatory Visit: Payer: 59

## 2017-11-30 ENCOUNTER — Ambulatory Visit: Payer: 59

## 2017-11-30 DIAGNOSIS — M545 Low back pain, unspecified: Secondary | ICD-10-CM

## 2017-11-30 DIAGNOSIS — G8929 Other chronic pain: Secondary | ICD-10-CM

## 2017-11-30 DIAGNOSIS — M6281 Muscle weakness (generalized): Secondary | ICD-10-CM
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
## 2017-12-02 ENCOUNTER — Ambulatory Visit: Payer: 59

## 2017-12-02 DIAGNOSIS — M545 Low back pain: Principal | ICD-10-CM

## 2017-12-02 DIAGNOSIS — G8929 Other chronic pain: Secondary | ICD-10-CM
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
## 2017-12-07 ENCOUNTER — Ambulatory Visit: Payer: 59

## 2017-12-08 ENCOUNTER — Ambulatory Visit: Payer: 59 | Admitting: Family Medicine

## 2017-12-08 ENCOUNTER — Encounter: Payer: Self-pay | Admitting: Family Medicine

## 2017-12-08 VITALS — BP 118/78 | HR 90 | Temp 98.5°F | Ht 64.0 in | Wt 217.4 lb

## 2017-12-08 DIAGNOSIS — R0981 Nasal congestion: Secondary | ICD-10-CM | POA: Diagnosis not present

## 2017-12-08 DIAGNOSIS — R490 Dysphonia: Secondary | ICD-10-CM | POA: Diagnosis not present

## 2017-12-08 DIAGNOSIS — J040 Acute laryngitis: Secondary | ICD-10-CM

## 2017-12-08 DIAGNOSIS — J029 Acute pharyngitis, unspecified: Secondary | ICD-10-CM

## 2017-12-08 LAB — POC INFLUENZA A&B (BINAX/QUICKVUE)
INFLUENZA A, POC: NEGATIVE
Influenza B, POC: NEGATIVE

## 2017-12-08 LAB — POCT RAPID STREP A (OFFICE): Rapid Strep A Screen: NEGATIVE

## 2017-12-08 MED ORDER — METHYLPREDNISOLONE 4 MG PO TBPK: ORAL_TABLET | ORAL | 0 refills | Status: DC

## 2017-12-08 MED ORDER — METHYLPREDNISOLONE ACETATE 40 MG/ML IJ SUSP
40.0000 mg | Freq: Once | INTRAMUSCULAR | Status: AC
  Administered 2017-12-08: 40 mg via INTRAMUSCULAR

## 2017-12-08 NOTE — Patient Instructions (Addendum)
Continue daily Claritin to treat nasal congestion, rest voice, increase fluids  Laryngitis Laryngitis is swelling (inflammation) of your vocal cords. This causes hoarseness, coughing, loss of voice, sore throat, or a dry throat. When your vocal cords are inflamed, your voice sounds different. Laryngitis can be temporary (acute) or long-term (chronic). Most cases of acute laryngitis improve with time. Chronic laryngitis is laryngitis that lasts for more than three weeks. Follow these instructions at home:  Drink enough fluid to keep your pee (urine) clear or pale yellow.  Breathe in moist air. Use a humidifier if you live in a dry climate.  Take medicines only as told by your doctor.  Do not smoke cigarettes or electronic cigarettes. If you need help quitting, ask your doctor.  Talk as little as possible. Also avoid whispering, which can cause vocal strain.  Write instead of talking. Do this until your voice is back to normal. Contact a doctor if:  You have a fever.  Your pain is worse.  You have trouble swallowing. Get help right away if:  You cough up blood.  You have trouble breathing. This information is not intended to replace advice given to you by your health care provider. Make sure you discuss any questions you have with your health care provider. Document Released: 01/22/2011 Document Revised: 07/11/2015 Document Reviewed: 07/18/2013 Elsevier Interactive Patient Education  [employer].
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
## 2017-12-09 ENCOUNTER — Ambulatory Visit: Payer: 59

## 2017-12-10 LAB — CULTURE, UPPER RESPIRATORY
MICRO NUMBER:: 91274868
SPECIMEN QUALITY: ADEQUATE

## 2017-12-13 ENCOUNTER — Ambulatory Visit: Payer: Self-pay

## 2017-12-13 DIAGNOSIS — J029 Acute pharyngitis, unspecified: Secondary | ICD-10-CM

## 2017-12-13 DIAGNOSIS — J040 Acute laryngitis: Secondary | ICD-10-CM

## 2017-12-13 DIAGNOSIS — R059 Cough, unspecified: Secondary | ICD-10-CM

## 2017-12-13 DIAGNOSIS — R05 Cough: Secondary | ICD-10-CM

## 2017-12-13 DIAGNOSIS — R490 Dysphonia: Secondary | ICD-10-CM

## 2017-12-13 NOTE — Telephone Encounter (Signed)
Please advise 

## 2017-12-13 NOTE — Telephone Encounter (Signed)
Pt. reports she is still coughing "uncontrolably" and still has the raspy voice. Wants to know what else can be done for her. Please advise pt.

## 2017-12-14 MED ORDER — GUAIFENESIN-CODEINE 100-10 MG/5ML PO SOLN: 10.0000 mL | Freq: Four times a day (QID) | ORAL | 0 refills | Status: DC | PRN

## 2017-12-14 MED ORDER — BENZONATATE 100 MG PO CAPS: 100.0000 mg | ORAL_CAPSULE | Freq: Three times a day (TID) | ORAL | 0 refills | Status: DC | PRN

## 2017-12-14 MED ORDER — METHYLPREDNISOLONE 4 MG PO TBPK: ORAL_TABLET | ORAL | 0 refills | Status: DC

## 2017-12-14 NOTE — Addendum Note (Signed)
Addended by: Philis Nettle on: 12/14/2017 01:51 PM   Modules accepted: Orders

## 2017-12-14 NOTE — Telephone Encounter (Signed)
Laryngitis can take a few weeks to fully resolve. I can do another steroid taper and also Rx for tessalon perles and robitussin cough syrup with codeine.

## 2017-12-14 NOTE — Telephone Encounter (Signed)
Called Pt to give her a message from NP [place] no answer. Okay for [facility] to talk to Pt. NP Philis Nettle stated Laryngitis can take a few weeks to fully resolve. Margaret Silva stated she can do another steroid taper and also Rx for tessalon perles and robitussin cough syrup with codeine if the Pt would like.

## 2018-01-07 ENCOUNTER — Other Ambulatory Visit: Payer: Self-pay | Admitting: Family Medicine

## 2018-01-07 NOTE — Telephone Encounter (Signed)
Controlled substance database reviewed. Sent to pharmacy.   

## 2018-01-10 DIAGNOSIS — J209 Acute bronchitis, unspecified: Secondary | ICD-10-CM | POA: Diagnosis not present

## 2018-01-10 DIAGNOSIS — R05 Cough: Secondary | ICD-10-CM | POA: Diagnosis not present

## 2018-01-26 ENCOUNTER — Other Ambulatory Visit: Payer: Self-pay | Admitting: Family Medicine

## 2018-01-26 DIAGNOSIS — B009 Herpesviral infection, unspecified: Secondary | ICD-10-CM

## 2018-01-26 NOTE — Telephone Encounter (Signed)
Copied from [place] [id number]. Topic: Quick Communication - See Telephone Encounter >> Jan 26, 2018  1:48 PM Hewitt Shorts wrote: Pt Is needing a refill on valacyclovir she states that [facility] on [address] has been requesting this but nothing in chart  Best number [phone]

## 2018-01-27 MED ORDER — VALACYCLOVIR HCL 500 MG PO TABS: 500.0000 mg | ORAL_TABLET | Freq: Every day | ORAL | 1 refills | Status: DC

## 2018-01-31 ENCOUNTER — Other Ambulatory Visit: Payer: Self-pay

## 2018-01-31 DIAGNOSIS — B009 Herpesviral infection, unspecified: Secondary | ICD-10-CM

## 2018-01-31 MED ORDER — VALACYCLOVIR HCL 500 MG PO TABS: 500.0000 mg | ORAL_TABLET | Freq: Every day | ORAL | 1 refills | Status: DC

## 2018-02-26 DIAGNOSIS — J111 Influenza due to unidentified influenza virus with other respiratory manifestations: Secondary | ICD-10-CM | POA: Diagnosis not present

## 2018-03-14 ENCOUNTER — Ambulatory Visit: Payer: 59 | Admitting: Family Medicine

## 2018-03-14 ENCOUNTER — Encounter: Payer: Self-pay | Admitting: Family Medicine

## 2018-03-14 ENCOUNTER — Ambulatory Visit: Payer: 59 | Admitting: Family

## 2018-03-14 ENCOUNTER — Other Ambulatory Visit (HOSPITAL_COMMUNITY)
Admission: RE | Admit: 2018-03-14 | Discharge: 2018-03-14 | Disposition: A | Discharge status: Home / Self Care | Payer: 59 | Source: Ambulatory Visit | Attending: Family Medicine | Admitting: Family Medicine

## 2018-03-14 VITALS — BP 120/78 | HR 94 | Temp 98.1°F | Resp 18

## 2018-03-14 DIAGNOSIS — R300 Dysuria: Secondary | ICD-10-CM

## 2018-03-14 DIAGNOSIS — N898 Other specified noninflammatory disorders of vagina: Secondary | ICD-10-CM | POA: Diagnosis not present

## 2018-03-14 DIAGNOSIS — R05 Cough: Secondary | ICD-10-CM

## 2018-03-14 DIAGNOSIS — R1310 Dysphagia, unspecified: Secondary | ICD-10-CM | POA: Diagnosis not present

## 2018-03-14 DIAGNOSIS — R059 Cough, unspecified: Secondary | ICD-10-CM

## 2018-03-14 LAB — POCT URINALYSIS DIPSTICK
BILIRUBIN UA: NEGATIVE
GLUCOSE UA: NEGATIVE
KETONES UA: POSITIVE
Nitrite, UA: NEGATIVE
Protein, UA: POSITIVE — AB
RBC UA: NEGATIVE
SPEC GRAV UA: 1.025 (ref 1.010–1.025)
Urobilinogen, UA: 1 E.U./dL
pH, UA: 6.5 (ref 5.0–8.0)

## 2018-03-14 LAB — POCT URINE PREGNANCY: Preg Test, Ur: NEGATIVE

## 2018-03-14 MED ORDER — PREDNISONE 20 MG PO TABS: 40.0000 mg | ORAL_TABLET | Freq: Every day | ORAL | 0 refills | Status: DC

## 2018-03-14 MED ORDER — FLUCONAZOLE 150 MG PO TABS: 150.0000 mg | ORAL_TABLET | ORAL | 0 refills | Status: DC

## 2018-03-14 NOTE — Patient Instructions (Signed)
Nice to see you. We will treat you for a yeast infection with Diflucan.  We will contact you when the rest of your labs return. We will treat you with prednisone for presumed asthma exacerbation.  If you develop worsening symptoms or your symptoms do not improve please let us know and be evaluated immediately.

## 2018-03-15 ENCOUNTER — Other Ambulatory Visit: Payer: Self-pay | Admitting: Family Medicine

## 2018-03-15 ENCOUNTER — Telehealth: Payer: Self-pay | Admitting: Family Medicine

## 2018-03-15 DIAGNOSIS — N898 Other specified noninflammatory disorders of vagina: Secondary | ICD-10-CM | POA: Insufficient documentation

## 2018-03-15 DIAGNOSIS — R059 Cough, unspecified: Secondary | ICD-10-CM | POA: Insufficient documentation

## 2018-03-15 DIAGNOSIS — R1310 Dysphagia, unspecified: Secondary | ICD-10-CM | POA: Insufficient documentation

## 2018-03-15 DIAGNOSIS — R809 Proteinuria, unspecified: Secondary | ICD-10-CM

## 2018-03-15 DIAGNOSIS — R05 Cough: Secondary | ICD-10-CM | POA: Insufficient documentation

## 2018-03-15 LAB — URINALYSIS, MICROSCOPIC ONLY
Casts: NONE SEEN /lpf
Epithelial Cells (non renal): >10 /hpf — AB (ref 0–10)
WBC, UA: >30 /hpf — AB (ref 0–5)

## 2018-03-15 NOTE — Telephone Encounter (Signed)
Please let the patient know that I would like for her to see ENT.  We discussed a sensation of her feeling choked when swallowing during her office visit on Monday and I do not remember whether or not I discussed the ENT evaluation with her.  I have placed a referral for this.  Thanks.

## 2018-03-15 NOTE — Assessment & Plan Note (Signed)
Seems to be isolated to her throat.  We will have her see ENT.

## 2018-03-15 NOTE — Assessment & Plan Note (Addendum)
Given history I suspect yeast vaginitis though given appearance of discharge this could represent BV.  We will send swabs for yeast, BV, trichomonas, gonorrhea, and chlamydia.  We will go ahead and treat with Diflucan.  We will send urine for culture and microscopy as well.

## 2018-03-15 NOTE — Assessment & Plan Note (Signed)
I suspect patient's symptoms are related to allergies with possible reactive airway component given improvement with her inhaler.  Potentially viral in nature.  No signs on exam to indicate a focal bacterial illness.  Overall she is improving.  We will treat with prednisone.  She will monitor and if not improving let us know.

## 2018-03-15 NOTE — Progress Notes (Signed)
Tommi Rumps, MD Phone: [phone]  Margaret Silva is a 38 y.o. female who presents today for same-day visit.  CC: Cough, vaginal discharge, choked sensation  Cough: Patient notes that she had nausea, vomiting, and diarrhea prior to going on a trip to [place] for a mission trip.  She had aches and chills with this.  She was evaluated at [facility] and had negative flu test.  She was treated with prednisone.  Those symptoms resolved prior to going on her trip.  She notes 7 days ago developing cough and congestion with some body aches while in [place].  She notes she was sleeping on a wooden bed that had some termites and she was exposed to dust.  She notes some congestion and wheezing with this.  She has had some facial congestion.  She had some sweats initially though those have resolved.  The aches have improved.  The congestion is improving.  The cough is improving though still bothers her mostly at night.  She notes she is using her inhalers and those helped significantly.  She has had no fevers.  She did have some posttussive emesis.  She notes her inhaler came from her ENT physician.  She reports a history of asthma.  Choked sensation: Patient notes intermittently recently she has felt choked in her throat when she is swallowing.  This occurs when she drinks or tries to swallow.  No trouble below her throat.  Vaginal discharge: Patient reports she was treated for UTI while in [place] and those symptoms resolved though she developed burning and itching with yellow/orange vaginal discharge.  She notes urgency that is unchanged from her baseline.  No frequency.  She has an IUD and thus does not have menstrual cycles.  Patient notes she is not sexually active.  Notes she had gonorrhea and Chlamydia testing late last year.  Social History   Tobacco Use  Smoking Status Never Smoker  Smokeless Tobacco Never Used     ROS see history of present illness  Objective  Physical  Exam Vitals:   03/14/18 1400  BP: 120/78  Pulse: 94  Resp: 18  Temp: 98.1 F (36.7 C)  SpO2: 96%    BP Readings from Last 3 Encounters:  03/14/18 120/78  12/08/17 118/78  11/24/17 122/68   Wt Readings from Last 3 Encounters:  12/08/17 217 lb 6.4 oz (98.6 kg)  11/24/17 217 lb 6.4 oz (98.6 kg)  11/01/17 219 lb 3.2 oz (99.4 kg)    Physical Exam Constitutional:      General: She is not in acute distress.    Appearance: She is not diaphoretic.  HENT:     Head: Normocephalic and atraumatic.     Right Ear: Tympanic membrane normal.     Left Ear: Tympanic membrane normal.     Mouth/Throat:     Mouth: Mucous membranes are moist.     Pharynx: Oropharynx is clear.  Eyes:     Conjunctiva/sclera: Conjunctivae normal.     Pupils: Pupils are equal, round, and reactive to light.  Cardiovascular:     Rate and Rhythm: Normal rate and regular rhythm.     Heart sounds: Normal heart sounds.  Pulmonary:     Effort: Pulmonary effort is normal.     Breath sounds: Normal breath sounds.  Genitourinary:    Comments: Chaperone used, normal labia, normal vaginal mucosa with thin yellowish discharge, IUD screens identified, slight friability to cervix with small amount of bleeding on collection of ThinPrep, no cervical  motion tenderness, no adnexal tenderness or masses Musculoskeletal:     Right lower leg: No edema.  Lymphadenopathy:     Cervical: No cervical adenopathy.  Skin:    General: Skin is warm and dry.  Neurological:     Mental Status: She is alert.      Assessment/Plan: Please see individual problem list.  Cough I suspect patient's symptoms are related to allergies with possible reactive airway component given improvement with her inhaler.  Potentially viral in nature.  No signs on exam to indicate a focal bacterial illness.  Overall she is improving.  We will treat with prednisone.  She will monitor and if not improving let us know.  Vaginal discharge Given history I  suspect yeast vaginitis though given appearance of discharge this could represent BV.  We will send swabs for yeast, BV, trichomonas, gonorrhea, and chlamydia.  We will go ahead and treat with Diflucan.  We will send urine for culture and microscopy as well.  Difficulty swallowing Seems to be isolated to her throat.  We will have her see ENT.  LPN was concerned given patient's presenting symptoms of cough with congestion and body aches regarding coronavirus.  She spoke with the health department who advised as long as the patient had not traveled to the area of concern in [place] or other areas of concern in [place] and had not been in contact with somebody from that area that only standard precautions were needed and no additional evaluation was needed regarding coronavirus.  Orders Placed This Encounter  Procedures  . Urine Culture  . Urine Microscopic Only  . Ambulatory referral to ENT    Referral Priority:   Routine    Referral Type:   Consultation    Referral Reason:   Specialty Services Required    Requested Specialty:   Otolaryngology    Number of Visits Requested:   1  . POCT Urinalysis Dipstick  . POCT urine pregnancy    Meds ordered this encounter  Medications  . predniSONE (DELTASONE) 20 MG tablet    Sig: Take 2 tablets (40 mg total) by mouth daily with breakfast.    Dispense:  10 tablet    Refill:  0  . fluconazole (DIFLUCAN) 150 MG tablet    Sig: Take 1 tablet (150 mg total) by mouth every 3 (three) days.    Dispense:  2 tablet    Refill:  0     Tommi Rumps, MD [place]

## 2018-03-16 LAB — URINE CULTURE

## 2018-03-16 NOTE — Telephone Encounter (Signed)
Referral closed

## 2018-03-16 NOTE — Telephone Encounter (Signed)
Called and spoke with patient. Pt advised she stated that she already has an ENT and she will follow up with them. Sent to [facility] as an FYI to not do referral if not already done since no longer needed.

## 2018-03-18 ENCOUNTER — Other Ambulatory Visit: Payer: Self-pay | Admitting: Family Medicine

## 2018-03-18 DIAGNOSIS — R05 Cough: Secondary | ICD-10-CM | POA: Diagnosis not present

## 2018-03-18 LAB — CYTOLOGY - PAP
Bacterial vaginitis: NEGATIVE
CANDIDA VAGINITIS: NEGATIVE
Chlamydia: NEGATIVE
Diagnosis: NEGATIVE
Neisseria Gonorrhea: NEGATIVE
Trichomonas: POSITIVE — AB

## 2018-03-18 MED ORDER — METRONIDAZOLE 500 MG PO TABS: 500.0000 mg | ORAL_TABLET | Freq: Two times a day (BID) | ORAL | 0 refills | Status: DC

## 2018-03-23 ENCOUNTER — Other Ambulatory Visit: Payer: Self-pay | Admitting: Family Medicine

## 2018-03-23 DIAGNOSIS — F419 Anxiety disorder, unspecified: Secondary | ICD-10-CM

## 2018-03-24 ENCOUNTER — Telehealth: Payer: Self-pay

## 2018-03-24 NOTE — Telephone Encounter (Signed)
LMTCB to see if patient has enough medication to last untol Dr. Caryl Bis gets back. Marland Kitchen

## 2018-03-24 NOTE — Telephone Encounter (Signed)
I called & LVM that flagyl had been sent in for patient.

## 2018-03-28 ENCOUNTER — Telehealth: Payer: Self-pay | Admitting: Family Medicine

## 2018-03-28 NOTE — Telephone Encounter (Signed)
It would be a good idea for her to be reevaluated.  Please see what symptoms she is referring to.  Is this upper respiratory symptoms or is this related to her vaginal discharge?  Please find out who she got amoxicillin from and if she was evaluated elsewhere for this.  Please also find out if she was able to pick up the Flagyl and start that.

## 2018-03-28 NOTE — Telephone Encounter (Signed)
Patient called back she went to her ENT for the Amoxicilolin.  The symptoms are coughing, sore throat, coughing to the point of throwing up, no fever, and the vaginal discharge is better but still there. Please advise.

## 2018-03-28 NOTE — Telephone Encounter (Signed)
Copied from [place] [id number]. Topic: Quick Communication - See Telephone Encounter >> Mar 28, 2018  9:30 AM Ivar Drape wrote: CRM for notification. See Telephone encounter for: 03/28/18. Patient was in to see the provider on 03/14/2018 but she is still experiencing the same symptoms plus now she has a very sore throat.  She has been taking Amoxicillin for 10 days now and it is doing nothing. Please advise.

## 2018-03-28 NOTE — Telephone Encounter (Signed)
Noted.  I would suggest reevaluation.  I could see her on 03/29/2018 at 315 or you could check to see what Lauren's availability is if that would work better.  Thanks.

## 2018-03-28 NOTE — Telephone Encounter (Signed)
Sent to PCP to advise if you would like for pt to come in for another OV to be reexamined?

## 2018-03-28 NOTE — Telephone Encounter (Signed)
Sent to PCP ?

## 2018-03-29 ENCOUNTER — Encounter: Payer: Self-pay | Admitting: Family Medicine

## 2018-03-29 ENCOUNTER — Other Ambulatory Visit: Payer: Self-pay | Admitting: *Deleted

## 2018-03-29 ENCOUNTER — Ambulatory Visit (INDEPENDENT_AMBULATORY_CARE_PROVIDER_SITE_OTHER): Payer: 59

## 2018-03-29 ENCOUNTER — Other Ambulatory Visit: Payer: 59

## 2018-03-29 ENCOUNTER — Ambulatory Visit: Payer: 59 | Admitting: Family Medicine

## 2018-03-29 VITALS — BP 118/84 | HR 103 | Temp 98.2°F | Resp 16 | Ht 64.0 in | Wt 214.0 lb

## 2018-03-29 DIAGNOSIS — R059 Cough, unspecified: Secondary | ICD-10-CM

## 2018-03-29 DIAGNOSIS — R05 Cough: Secondary | ICD-10-CM

## 2018-03-29 DIAGNOSIS — R1310 Dysphagia, unspecified: Secondary | ICD-10-CM | POA: Diagnosis not present

## 2018-03-29 DIAGNOSIS — R809 Proteinuria, unspecified: Secondary | ICD-10-CM

## 2018-03-29 MED ORDER — PREDNISONE 10 MG (21) PO TBPK: ORAL_TABLET | ORAL | 0 refills | Status: DC

## 2018-03-29 MED ORDER — HYDROCODONE-HOMATROPINE 5-1.5 MG/5ML PO SYRP: 5.0000 mL | ORAL_SOLUTION | Freq: Three times a day (TID) | ORAL | 0 refills | Status: DC | PRN
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
## 2018-03-29 NOTE — Telephone Encounter (Signed)
Pt was seen today by Ander Purpura

## 2018-03-29 NOTE — Addendum Note (Signed)
Addended by: Arby Barrette on: 03/29/2018 10:31 AM   Modules accepted: Orders

## 2018-03-29 NOTE — Telephone Encounter (Signed)
Opened in error

## 2018-03-30 ENCOUNTER — Other Ambulatory Visit: Payer: 59

## 2018-03-30 LAB — URINALYSIS, ROUTINE W REFLEX MICROSCOPIC
Bilirubin, UA: NEGATIVE
GLUCOSE, UA: NEGATIVE
KETONES UA: NEGATIVE
Leukocytes, UA: NEGATIVE
Nitrite, UA: NEGATIVE
Protein, UA: NEGATIVE
RBC, UA: NEGATIVE
SPEC GRAV UA: 1.017 (ref 1.005–1.030)
Urobilinogen, Ur: 0.2 mg/dL (ref 0.2–1.0)
pH, UA: 6 (ref 5.0–7.5)

## 2018-03-30 LAB — BASIC METABOLIC PANEL
BUN/Creatinine Ratio: 11 (ref 9–23)
BUN: 11 mg/dL (ref 6–20)
CHLORIDE: 103 mmol/L (ref 96–106)
CO2: 22 mmol/L (ref 20–29)
Calcium: 9.7 mg/dL (ref 8.7–10.2)
Creatinine, Ser: 0.97 mg/dL (ref 0.57–1.00)
GFR calc Af Amer: 86 mL/min/{1.73_m2} (ref >59)
GFR calc non Af Amer: 75 mL/min/{1.73_m2} (ref >59)
GLUCOSE: 90 mg/dL (ref 65–99)
POTASSIUM: 5.2 mmol/L (ref 3.5–5.2)
Sodium: 141 mmol/L (ref 134–144)

## 2018-03-30 LAB — CBC
Hematocrit: 39.6 % (ref 34.0–46.6)
Hemoglobin: 13.5 g/dL (ref 11.1–15.9)
MCH: 31.4 pg (ref 26.6–33.0)
MCHC: 34.1 g/dL (ref 31.5–35.7)
MCV: 92 fL (ref 79–97)
Platelets: 335 10*3/uL (ref 150–450)
RBC: 4.3 x10E6/uL (ref 3.77–5.28)
RDW: 13.4 % (ref 11.7–15.4)
WBC: 9.9 10*3/uL (ref 3.4–10.8)

## 2018-03-31 ENCOUNTER — Telehealth: Payer: Self-pay

## 2018-03-31 NOTE — Telephone Encounter (Signed)
PA was need for Zolpidem ER 6.25 MG  CASE NUMBER : [id number]   Waiting on determination will receive a fax within 48-72 hours.

## 2018-04-05 ENCOUNTER — Encounter: Payer: Self-pay | Admitting: Family Medicine

## 2018-04-05 ENCOUNTER — Ambulatory Visit: Payer: 59 | Admitting: Family Medicine

## 2018-04-05 VITALS — BP 118/80 | HR 94 | Temp 98.6°F | Resp 16 | Ht 64.0 in | Wt 214.0 lb

## 2018-04-05 DIAGNOSIS — R05 Cough: Secondary | ICD-10-CM

## 2018-04-05 DIAGNOSIS — R1310 Dysphagia, unspecified: Secondary | ICD-10-CM | POA: Diagnosis not present

## 2018-04-05 DIAGNOSIS — R058 Other specified cough: Secondary | ICD-10-CM
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
## 2018-04-13 ENCOUNTER — Other Ambulatory Visit: Payer: Self-pay | Admitting: Family Medicine

## 2018-04-13 NOTE — Telephone Encounter (Signed)
Pt is requesting Ambien.  Gae Bon, CMA

## 2018-04-14 NOTE — Telephone Encounter (Signed)
Noted.  It appears that she is getting armodafinil from a rheumatologist.  Please see what the patient is taking this for.  I would like to know this prior to refilling her Ambien.  Thanks.

## 2018-04-14 NOTE — Telephone Encounter (Signed)
Lmtcb.  Okay for pec to advise. Dr. Caryl Bis wants to know why she is taking [place] before he refills it   Gae Bon, [place]

## 2018-04-14 NOTE — Telephone Encounter (Signed)
Patient called, left VM to return call to the office for a question from Dr. Caryl Bis about the refill request.

## 2018-04-14 NOTE — Telephone Encounter (Signed)
Left VM to CB; see message from Dr. Caryl Bis regarding Armodafinil

## 2018-04-15 NOTE — Telephone Encounter (Signed)
Requested medication (s) are due for refill today: Yes  Requested medication (s) are on the active medication list: Yes  Last refill:  01/07/18  Future visit scheduled: Yes  Notes to clinic:  Unable to refill, cannot delegate     Requested Prescriptions  Pending Prescriptions Disp Refills   zolpidem (AMBIEN CR) 6.25 MG CR tablet [Pharmacy Med Name: ZOLPIDEM TARTRATE ER 6.25 MG TAB] 30 tablet     Sig: TAKE ONE TABLET AT BEDTIME AS NEEDED FORSLEEP     Not Delegated - Psychiatry:  Anxiolytics/Hypnotics Failed - 04/15/2018  4:54 PM      Failed - This refill cannot be delegated      Failed - Urine Drug Screen completed in last 360 days.      Passed - Valid encounter within last 6 months    Recent Outpatient Visits          1 week ago Swallowing problem   [place], FNP   2 weeks ago Cough in adult   [place], FNP   1 month ago [place], Angela Adam, MD   4 months ago [place] Guse, Jacquelynn Cree, FNP   4 months ago Gastroesophageal reflux disease, esophagitis presence not specified   [facility] Leone Haven, MD      Future Appointments            In 2 weeks Ofilia Neas, PA-C [facility]   In 1 month Caryl Bis, Angela Adam, MD [facility] [place]   In 4 months [place], Lara Mulch, CNM [facility]

## 2018-04-15 NOTE — Telephone Encounter (Signed)
Pt is needing refills.  Gae Bon, CMA

## 2018-04-16 NOTE — Telephone Encounter (Signed)
Controlled substance database reviewed. Sent to pharmacy.   
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
## 2018-04-18 NOTE — Telephone Encounter (Signed)
LM on voicemail that medication RX was sent to pharmacy.  Lesia Hausen

## 2018-05-02 ENCOUNTER — Other Ambulatory Visit: Payer: Self-pay | Admitting: Family Medicine

## 2018-05-02 ENCOUNTER — Other Ambulatory Visit: Payer: Self-pay

## 2018-05-02 ENCOUNTER — Ambulatory Visit: Payer: 59 | Admitting: Physician Assistant

## 2018-05-02 ENCOUNTER — Encounter: Payer: Self-pay | Admitting: Physician Assistant

## 2018-05-02 VITALS — BP 126/80 | HR 93 | Resp 13 | Ht 64.0 in | Wt 211.2 lb

## 2018-05-02 DIAGNOSIS — Z8659 Personal history of other mental and behavioral disorders: Secondary | ICD-10-CM

## 2018-05-02 DIAGNOSIS — Z87440 Personal history of urinary (tract) infections: Secondary | ICD-10-CM

## 2018-05-02 DIAGNOSIS — M797 Fibromyalgia: Secondary | ICD-10-CM | POA: Diagnosis not present

## 2018-05-02 DIAGNOSIS — F5104 Psychophysiologic insomnia: Secondary | ICD-10-CM

## 2018-05-02 DIAGNOSIS — M170 Bilateral primary osteoarthritis of knee: Secondary | ICD-10-CM | POA: Diagnosis not present

## 2018-05-02 DIAGNOSIS — R05 Cough: Secondary | ICD-10-CM

## 2018-05-02 DIAGNOSIS — G90A Postural orthostatic tachycardia syndrome (POTS): Secondary | ICD-10-CM

## 2018-05-02 DIAGNOSIS — R5383 Other fatigue: Secondary | ICD-10-CM | POA: Diagnosis not present

## 2018-05-02 DIAGNOSIS — G2581 Restless legs syndrome: Secondary | ICD-10-CM

## 2018-05-02 DIAGNOSIS — K588 Other irritable bowel syndrome: Secondary | ICD-10-CM

## 2018-05-02 DIAGNOSIS — B360 Pityriasis versicolor: Secondary | ICD-10-CM

## 2018-05-02 DIAGNOSIS — Z8679 Personal history of other diseases of the circulatory system: Secondary | ICD-10-CM

## 2018-05-02 DIAGNOSIS — I498 Other specified cardiac arrhythmias: Secondary | ICD-10-CM

## 2018-05-02 DIAGNOSIS — M722 Plantar fascial fibromatosis: Secondary | ICD-10-CM

## 2018-05-02 DIAGNOSIS — M62838 Other muscle spasm: Secondary | ICD-10-CM

## 2018-05-02 DIAGNOSIS — M7062 Trochanteric bursitis, left hip: Secondary | ICD-10-CM

## 2018-05-02 DIAGNOSIS — R059 Cough, unspecified: Secondary | ICD-10-CM

## 2018-05-02 DIAGNOSIS — R000 Tachycardia, unspecified: Secondary | ICD-10-CM

## 2018-05-02 DIAGNOSIS — I951 Orthostatic hypotension: Secondary | ICD-10-CM

## 2018-05-02 DIAGNOSIS — Z8669 Personal history of other diseases of the nervous system and sense organs: Secondary | ICD-10-CM

## 2018-05-02 MED ORDER — LIDOCAINE HCL 1 % IJ SOLN
0.5000 mL | INTRAMUSCULAR | Status: AC | PRN
  Administered 2018-05-02: .5 mL

## 2018-05-02 MED ORDER — TRIAMCINOLONE ACETONIDE 40 MG/ML IJ SUSP
40.0000 mg | INTRAMUSCULAR | Status: AC | PRN
  Administered 2018-05-02: 40 mg via INTRA_ARTICULAR

## 2018-05-02 MED ORDER — TRIAMCINOLONE ACETONIDE 40 MG/ML IJ SUSP
10.0000 mg | INTRAMUSCULAR | Status: AC | PRN
  Administered 2018-05-02: 10 mg via INTRAMUSCULAR

## 2018-05-02 MED ORDER — LIDOCAINE HCL 1 % IJ SOLN
1.5000 mL | INTRAMUSCULAR | Status: AC | PRN
  Administered 2018-05-02: 1.5 mL

## 2018-05-02 NOTE — Telephone Encounter (Signed)
Pt walked into the office. Upset that she has not recieved a call concerning her medication refill. Pt called this morning and requested Guse send a 2nd round of the cough medicine to her pharmacy. Notified the request could take up to 3 days.

## 2018-05-02 NOTE — Telephone Encounter (Signed)
Copied from [place] [id number]. Topic: Quick Communication - Rx Refill/Question >> May 02, 2018  9:57 AM Blase Mess A wrote: Medication: HYDROcodone-homatropine (HYCODAN) 5-1.5 MG/5ML syrup [[id number]]   Has the patient contacted their pharmacy? Yes (Agent: If no, request that the patient contact the pharmacy for the refill.) (Agent: If yes, when and what did the pharmacy advise?)  Preferred Pharmacy (with phone number or street name): [id number] [address], [place] [phone] (Phone) [phone] (Fax)    Agent: Please be advised that RX refills may take up to 3 business days. We ask that you follow-up with your pharmacy.

## 2018-05-03 MED ORDER — HYDROCODONE-HOMATROPINE 5-1.5 MG/5ML PO SYRP: 5.0000 mL | ORAL_SOLUTION | Freq: Three times a day (TID) | ORAL | 0 refills | Status: DC | PRN

## 2018-05-03 NOTE — Telephone Encounter (Signed)
Sent in.  We have 3 business days to respond to Rx requests. Please remind patients of this when they call to request a refill.

## 2018-05-10 ENCOUNTER — Other Ambulatory Visit: Payer: Self-pay | Admitting: Family Medicine

## 2018-05-10 ENCOUNTER — Other Ambulatory Visit: Payer: Self-pay | Admitting: Physician Assistant

## 2018-05-10 ENCOUNTER — Other Ambulatory Visit: Payer: Self-pay | Admitting: Internal Medicine

## 2018-05-10 NOTE — Telephone Encounter (Signed)
Last Visit: 05/02/18 Next Visit: 10/31/18  Okay to refill Nuvigil?

## 2018-05-10 NOTE — Telephone Encounter (Signed)
Please contact the patient to see if she has continued to take this medication.  Please see how frequently she takes this.

## 2018-05-11 NOTE — Telephone Encounter (Signed)
Refilled: 04/16/2018 Last OV: 04/05/2018 Next OV: 05/23/2018

## 2018-05-11 NOTE — Telephone Encounter (Signed)
LMTCB

## 2018-05-23 ENCOUNTER — Ambulatory Visit: Payer: 59 | Admitting: Family Medicine

## 2018-05-27 ENCOUNTER — Ambulatory Visit: Payer: 59 | Admitting: Family Medicine

## 2018-06-09 ENCOUNTER — Other Ambulatory Visit: Payer: Self-pay | Admitting: Family Medicine

## 2018-06-10 NOTE — Telephone Encounter (Signed)
Last OV 04/05/2018 with Guse   Last refilled 04/16/2018 disp 30 with 1 refill   Next OV 09/02/2018  Sent to PCP for approval

## 2018-06-13 ENCOUNTER — Other Ambulatory Visit: Payer: Self-pay

## 2018-06-13 ENCOUNTER — Other Ambulatory Visit: Payer: Self-pay | Admitting: Family Medicine

## 2018-06-13 ENCOUNTER — Ambulatory Visit: Payer: 59

## 2018-06-13 ENCOUNTER — Ambulatory Visit
Admission: RE | Admit: 2018-06-13 | Discharge: 2018-06-13 | Disposition: A | Discharge status: Home / Self Care | Payer: 59 | Source: Ambulatory Visit | Attending: Family Medicine | Admitting: Family Medicine

## 2018-06-13 DIAGNOSIS — R1310 Dysphagia, unspecified: Secondary | ICD-10-CM

## 2018-07-08 ENCOUNTER — Telehealth: Payer: Self-pay | Admitting: *Deleted

## 2018-07-08 NOTE — Telephone Encounter (Signed)
Received a Prior Authorization request from [facility] for Provigil. Authorization has been submitted to patient's insurance via Cover My Meds. Will update once we receive a response.

## 2018-07-13 NOTE — Telephone Encounter (Signed)
Received a fax regarding Prior Authorization from [facility] for Armodafinil. Authorization has been DENIED because not approved for use in her condition.  Will send document to scan center.  Phone # [phone]

## 2018-07-15 ENCOUNTER — Other Ambulatory Visit: Payer: Self-pay | Admitting: Family Medicine

## 2018-07-15 NOTE — Telephone Encounter (Signed)
Last filled 06/11/18 30 no refills, last OV 04/05/18?

## 2018-08-17 ENCOUNTER — Other Ambulatory Visit: Payer: Self-pay | Admitting: Family Medicine

## 2018-08-17 NOTE — Telephone Encounter (Signed)
This patient is requesting a refill on Ambien and [facility]  Nina,cma

## 2018-09-01 ENCOUNTER — Other Ambulatory Visit (HOSPITAL_COMMUNITY)
Admission: RE | Admit: 2018-09-01 | Discharge: 2018-09-01 | Disposition: A | Discharge status: Home / Self Care | Payer: 59 | Source: Ambulatory Visit | Attending: Certified Nurse Midwife | Admitting: Certified Nurse Midwife

## 2018-09-01 ENCOUNTER — Encounter: Payer: Self-pay | Admitting: Certified Nurse Midwife

## 2018-09-01 ENCOUNTER — Ambulatory Visit (INDEPENDENT_AMBULATORY_CARE_PROVIDER_SITE_OTHER): Payer: 59 | Admitting: Certified Nurse Midwife

## 2018-09-01 ENCOUNTER — Other Ambulatory Visit: Payer: Self-pay

## 2018-09-01 VITALS — BP 115/89 | HR 89 | Ht 64.0 in | Wt 210.7 lb

## 2018-09-01 DIAGNOSIS — N898 Other specified noninflammatory disorders of vagina: Secondary | ICD-10-CM | POA: Diagnosis present

## 2018-09-01 DIAGNOSIS — Z01419 Encounter for gynecological examination (general) (routine) without abnormal findings: Secondary | ICD-10-CM | POA: Diagnosis present

## 2018-09-01 DIAGNOSIS — Z8619 Personal history of other infectious and parasitic diseases: Secondary | ICD-10-CM

## 2018-09-01 HISTORY — DX: Personal history of other infectious and parasitic diseases: Z86.19

## 2018-09-01 NOTE — Progress Notes (Signed)
ANNUAL PREVENTATIVE CARE GYN  ENCOUNTER NOTE  Subjective:       Margaret Silva is a 38 y.o. G0P0000 female here for a routine annual gynecologic exam.  Current complaints: 1. Mucous consistency vaginal discharge x six (6) months 2. Needs test of cure  Denies difficulty breathing or respiratory distress, chest pain, abdominal pain, excessive vaginal bleeding, dysuria, and leg pain or swelling.    Gynecologic History  No LMP recorded. (Menstrual status: IUD).  Contraception: IUD, Inserted 10/2008  Last Pap: 01/20. Results were: normal; 08/2017 Negative/Negative  Obstetric History  OB History  Gravida Para Term Preterm AB Living  0 0 0 0 0 0  SAB TAB Ectopic Multiple Live Births  0 0 0 0 0    Past Medical History:  Diagnosis Date  . Asthma   . Bursitis of left hip   . Diverticulitis    h/o  . Endometriosis   . GERD (gastroesophageal reflux disease)   . Headache   . History of chicken pox   . History of colonoscopy 11/04 and April 06  . History of laparoscopy 12/04 and 12/07  . History of laparoscopy 01/29/2006   Dr. Georga Bora Endometriosis  . Hx of migraines   . Hx: UTI (urinary tract infection)   . Interstitial cystitis 01/03/2004   hydrodistilation  . Jaundice    h/o  . Plantar fasciitis, bilateral   . 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 CYSTOSCOPY WITH HYDRODISTENSION AND BIOPSY    . DIAGNOSTIC LAPAROSCOPY  02/16/2003  . SIGMOIDOSCOPY  05/26/10    Current Outpatient Medications on File Prior to Visit  Medication Sig Dispense Refill  . albuterol (PROVENTIL HFA;VENTOLIN HFA) 108 (90 Base) MCG/ACT inhaler Inhale into the lungs every 6 (six) hours as needed for wheezing or shortness of breath.    Marland Kitchen amitriptyline (ELAVIL) 50 MG tablet Take 1 tablet (50 mg total) by mouth at bedtime. 90 tablet 4  . Armodafinil 50 MG tablet TAKE 1 TABLET(50 MG) BY MOUTH DAILY  (Patient taking differently: Take 50 mg by mouth as needed. ) 30 tablet 0  . Cholecalciferol (VITAMIN D PO) Take 5,000 Int'l Units by mouth once a week.    . diclofenac sodium (VOLTAREN) 1 % GEL Apply 4 g topically 3 (three) times daily as needed.     . divalproex (DEPAKOTE) 500 MG DR tablet Take 1 tablet (500 mg total) by mouth daily. 90 tablet 4  . DULoxetine (CYMBALTA) 30 MG capsule TAKE 3 CAPSULES BY MOUTH ONCE A DAY 270 capsule 1  . Fluticasone Propionate, Inhal, (FLOVENT IN) Inhale into the lungs as needed.    Marland Kitchen gentamicin cream (GARAMYCIN) 0.1 % Apply 1 application topically as needed (for nose).    Marland Kitchen HYDROcodone-homatropine (HYCODAN) 5-1.5 MG/5ML syrup Take 5 mLs by mouth every 8 (eight) hours as needed for cough. 120 mL 0  . Levonorgestrel (LILETTA, 52 MG, IU) by Intrauterine route.    . loratadine (CLARITIN) 10 MG tablet TAKE 1 TABLET (10 MG TOTAL) BY MOUTH DAILY. 90 tablet 3  . omeprazole (PRILOSEC) 20 MG capsule Take 1 capsule (20 mg total) by mouth daily. 90 capsule 3  . ondansetron (ZOFRAN-ODT) 4 MG disintegrating tablet Take 1 tablet (4 mg total) by mouth every 8 (eight) hours as needed for nausea. 30 tablet 12  . valACYclovir (VALTREX) 500 MG tablet Take 1 tablet (500 mg total) by mouth  daily. 90 tablet 1  . zolpidem (AMBIEN CR) 6.25 MG CR tablet TAKE ONE TABLET BY MOUTH AT BEDTIME AS NEEDED FOR SLEEP 30 tablet 0  . SUMAtriptan Succinate (ONZETRA XSAIL) 11 MG/NOSEPC EXHP Place 1 Dose into both nostrils once for 1 dose. 1 spray per nostril  at onset of migraine, may repeat dose after 2 hours. Max twice daily. 1 each 11   No current facility-administered medications on file prior to visit.     Allergies  Allergen Reactions  . Coffee Bean Extract  [Coffea Arabica] Nausea Only  . Other     Outdoor allergies   . Trazodone And Nefazodone Other (See Comments)    irritability    Social History   Socioeconomic History  . Marital status: Divorced    Spouse name: Not on file  .  Number of children: 0  . Years of education: 90   . Highest education level: Not on file  Occupational History  . Occupation: [employer]   Social Needs  . Financial resource strain: Not on file  . Food insecurity    Worry: Not on file    Inability: Not on file  . Transportation needs    Medical: Not on file    Non-medical: Not on file  Tobacco Use  . Smoking status: Never Smoker  . Smokeless tobacco: Never Used  Substance and Sexual Activity  . Alcohol use: Yes    Alcohol/week: 0.0 standard drinks    Comment: occasionally  . Drug use: No  . Sexual activity: Not Currently    Partners: Male    Birth control/protection: I.U.D.  Lifestyle  . Physical activity    Days per week: Not on file    Minutes per session: Not on file  . Stress: Not on file  Relationships  . Social Herbalist on phone: Not on file    Gets together: Not on file    Attends religious service: Not on file    Active member of club or organization: Not on file    Attends meetings of clubs or organizations: Not on file    Relationship status: Not on file  . Intimate partner violence    Fear of current or ex partner: Not on file    Emotionally abused: Not on file    Physically abused: Not on file    Forced sexual activity: Not on file  Other Topics Concern  . Not on file  Social History Narrative   Lives with dog   Caffeine use:  Generally when she has a headache, probably less than a weekly    Family History  Problem Relation Age of Onset  . Diabetes Maternal Grandmother   . Arthritis Maternal Grandmother   . Kidney disease Maternal Grandmother   . Hypertension Father   . Hyperlipidemia Father   . Fibromyalgia Mother   . Migraines Mother   . Cancer Mother 87       breast  . Breast cancer Mother   . Ehlers-Danlos syndrome Sister   . Fibromyalgia Sister   . Thyroid cancer Sister   . Heart attack Paternal Grandfather   . Cancer Paternal Grandfather        Pancreatic/  Bladder Cancer/prostate  . Arthritis Paternal Grandfather   . Heart disease Paternal Grandfather   . Cancer Paternal Grandmother        Liver cancer/lung cancer  . Diabetes Paternal Grandmother   . Heart attack Maternal Grandfather   .  Heart disease Maternal Grandfather   . Hypertension Maternal Grandfather   . Mental illness Maternal Grandfather   . Ovarian cancer Neg Hx   . Colon cancer Neg Hx     The following portions of the patient's history were reviewed and updated as appropriate: allergies, current medications, past family history, past medical history, past social history, past surgical history and problem list.  Review of Systems  ROS negative except as noted above. Information obtained from patient.    Objective:   BP 115/89   Pulse 89   Ht 5\' 4"  (1.626 m)   Wt 210 lb 11.2 oz (95.6 kg)   BMI 36.17 kg/m    CONSTITUTIONAL: Well-developed, well-nourished female in no acute distress.   PSYCHIATRIC: Normal mood and affect. Normal behavior. Normal judgment and thought content.  Platea: Alert and oriented to person, place, and time. Normal muscle tone coordination. No cranial nerve deficit noted.  HENT:  Normocephalic, atraumatic, External right and left ear normal.  EYES: Conjunctivae and EOM are normal. Pupils are equal and round.   NECK: Normal range of motion, supple, no masses.  Normal thyroid.   SKIN: Skin is warm and dry. No rash noted. Not diaphoretic. No erythema. No pallor.  CARDIOVASCULAR: Normal heart rate noted, regular rhythm, no murmur.  RESPIRATORY: Clear to auscultation bilaterally. Effort and breath sounds normal, no problems with respiration noted.  BREASTS: Symmetric in size. No masses, skin changes, nipple  drainage, or lymphadenopathy.  ABDOMEN: Soft, normal bowel sounds, no distention noted.  No tenderness, rebound or guarding. Obese.   PELVIC:  External Genitalia: Erythema noted  Vagina: Normal  Cervix: Normal, IUD strings present,  swab collected  Uterus: Normal  Adnexa: Normal  MUSCULOSKELETAL: Normal range of motion. No tenderness.  No cyanosis, clubbing, or edema.  2+ distal pulses.  LYMPHATIC: No Axillary, Supraclavicular, or Inguinal Adenopathy.  Assessment:   Annual gynecologic examination 38 y.o.   Contraception: IUD   Obesity 2   Problem List Items Addressed This Visit      Other   Vaginal discharge   Relevant Orders   NuSwab Vaginitis (VG)    Other Visit Diagnoses    Well woman exam    -  Primary   Relevant Orders   NuSwab Vaginitis (VG)   History of trichomoniasis       Relevant Orders   NuSwab Vaginitis (VG)      Plan:   Pap: Not needed  Labs: See orders   Routine preventative health maintenance measures emphasized: Exercise/Diet/Weight control, Tobacco Warnings, Alcohol/Substance use risks and Stress Management; see AVS  Discussed home vaginal health techniques  Reviewed red flag symptoms and when to call  RTC x 1 year for ANNUAL EXAM or sooner if needed   Diona Fanti, CNM [facility], [facility] 09/01/18 3:35 PM

## 2018-09-01 NOTE — Progress Notes (Signed)
Patient here for annual exam.  Patient c/o mucous vaginal discharge x6 months.

## 2018-09-01 NOTE — Patient Instructions (Signed)
Vaginitis  Vaginitis is irritation and swelling (inflammation) of the vagina. It happens when normal bacteria and yeast in the vagina grow too much. There are many types of this condition. Treatment will depend on the type you have. Follow these instructions at home: Lifestyle  Keep your vagina area clean and dry. ? Avoid using soap. ? Rinse the area with water.  Do not do the following until your doctor says it is okay: ? Wash and clean out the vagina (douche). ? Use tampons. ? Have sex.  Wipe from front to back after going to the bathroom.  Let air reach your vagina. ? Wear cotton underwear. ? Do not wear: ? Underwear while you sleep. ? Tight pants. ? Thong underwear. ? Underwear or nylons without a cotton panel. ? Take off any wet clothing, such as bathing suits, as soon as possible.  Use gentle, non-scented products. Do not use things that can irritate the vagina, such as fabric softeners. Avoid the following products if they are scented: ? Feminine sprays. ? Detergents. ? Tampons. ? Feminine hygiene products. ? Soaps or bubble baths.  Practice safe sex and use condoms. General instructions  Take over-the-counter and prescription medicines only as told by your doctor.  If you were prescribed an antibiotic medicine, take or use it as told by your doctor. Do not stop taking or using the antibiotic even if you start to feel better.  Keep all follow-up visits as told by your doctor. This is important. Contact a doctor if:  You have pain in your belly.  You have a fever.  Your symptoms last for more than 2-3 days. Get help right away if:  You have a fever and your symptoms get worse all of a sudden. Summary  Vaginitis is irritation and swelling of the vagina. It can happen when the normal bacteria and yeast in the vagina grow too much. There are many types.  Treatment will depend on the type you have.  Do not douche, use tampons , or have sex until your health  care provider approves. When you can return to sex, practice safe sex and use condoms. This information is not intended to replace advice given to you by your health care provider. Make sure you discuss any questions you have with your health care provider. Document Released: 05/01/2008 Document Revised: 01/15/2017 Document Reviewed: 02/25/2016 Elsevier Patient Education  2020 [place] 82-37 Years Old, Female Preventive care refers to visits with your health care provider and lifestyle choices that can promote health and wellness. This includes:  A yearly physical exam. This may also be called an annual well check.  Regular dental visits and eye exams.  Immunizations.  Screening for certain conditions.  Healthy lifestyle choices, such as eating a healthy diet, getting regular exercise, not using drugs or products that contain nicotine and tobacco, and limiting alcohol use. What can I expect for my preventive care visit? Physical exam Your health care provider will check your:  Height and weight. This may be used to calculate body mass index (BMI), which tells if you are at a healthy weight.  Heart rate and blood pressure.  Skin for abnormal spots. Counseling Your health care provider may ask you questions about your:  Alcohol, tobacco, and drug use.  Emotional well-being.  Home and relationship well-being.  Sexual activity.  Eating habits.  Work and work Statistician.  Method of birth control.  Menstrual cycle.  Pregnancy history. What immunizations do I need?  Influenza (  flu) vaccine  This is recommended every year. Tetanus, diphtheria, and pertussis (Tdap) vaccine  You may need a Td booster every 10 years. Varicella (chickenpox) vaccine  You may need this if you have not been vaccinated. Human papillomavirus (HPV) vaccine  If recommended by your health care provider, you may need three doses over 6 months. Measles, mumps, and rubella  (MMR) vaccine  You may need at least one dose of MMR. You may also need a second dose. Meningococcal conjugate (MenACWY) vaccine  One dose is recommended if you are age 37-21 years and a first-year college student living in a residence hall, or if you have one of several medical conditions. You may also need additional booster doses. Pneumococcal conjugate (PCV13) vaccine  You may need this if you have certain conditions and were not previously vaccinated. Pneumococcal polysaccharide (PPSV23) vaccine  You may need one or two doses if you smoke cigarettes or if you have certain conditions. Hepatitis A vaccine  You may need this if you have certain conditions or if you travel or work in places where you may be exposed to hepatitis A. Hepatitis B vaccine  You may need this if you have certain conditions or if you travel or work in places where you may be exposed to hepatitis B. Haemophilus influenzae type b (Hib) vaccine  You may need this if you have certain conditions. You may receive vaccines as individual doses or as more than one vaccine together in one shot (combination vaccines). Talk with your health care provider about the risks and benefits of combination vaccines. What tests do I need?  Blood tests  Lipid and cholesterol levels. These may be checked every 5 years starting at age 37.  Hepatitis C test.  Hepatitis B test. Screening  Diabetes screening. This is done by checking your blood sugar (glucose) after you have not eaten for a while (fasting).  Sexually transmitted disease (STD) testing.  BRCA-related cancer screening. This may be done if you have a family history of breast, ovarian, tubal, or peritoneal cancers.  Pelvic exam and Pap test. This may be done every 3 years starting at age 37. Starting at age 37, this may be done every 5 years if you have a Pap test in combination with an HPV test. Talk with your health care provider about your test results, treatment  options, and if necessary, the need for more tests. Follow these instructions at home: Eating and drinking   Eat a diet that includes fresh fruits and vegetables, whole grains, lean protein, and low-fat dairy.  Take vitamin and mineral supplements as recommended by your health care provider.  Do not drink alcohol if: ? Your health care provider tells you not to drink. ? You are pregnant, may be pregnant, or are planning to become pregnant.  If you drink alcohol: ? Limit how much you have to 0-1 drink a day. ? Be aware of how much alcohol is in your drink. In the [place] one drink equals one 12 oz bottle of beer (355 mL), one 5 oz glass of wine (148 mL), or one 1 oz glass of hard liquor (44 mL). Lifestyle  Take daily care of your teeth and gums.  Stay active. Exercise for at least 30 minutes on 5 or more days each week.  Do not use any products that contain nicotine or tobacco, such as cigarettes, e-cigarettes, and chewing tobacco. If you need help quitting, ask your health care provider.  If you are sexually active,  practice safe sex. Use a condom or other form of birth control (contraception) in order to prevent pregnancy and STIs (sexually transmitted infections). If you plan to become pregnant, see your health care provider for a preconception visit. What's next?  Visit your health care provider once a year for a well check visit.  Ask your health care provider how often you should have your eyes and teeth checked.  Stay up to date on all vaccines. This information is not intended to replace advice given to you by your health care provider. Make sure you discuss any questions you have with your health care provider. Document Released: 03/31/2001 Document Revised: 10/14/2017 Document Reviewed: 10/14/2017 Elsevier Patient Education  2020 [employer].

## 2018-09-02 ENCOUNTER — Encounter: Payer: Self-pay | Admitting: Family Medicine

## 2018-09-02 ENCOUNTER — Ambulatory Visit (INDEPENDENT_AMBULATORY_CARE_PROVIDER_SITE_OTHER): Payer: 59 | Admitting: Family Medicine

## 2018-09-02 DIAGNOSIS — K625 Hemorrhage of anus and rectum: Secondary | ICD-10-CM

## 2018-09-02 DIAGNOSIS — R1310 Dysphagia, unspecified: Secondary | ICD-10-CM | POA: Diagnosis not present

## 2018-09-02 DIAGNOSIS — K219 Gastro-esophageal reflux disease without esophagitis: Secondary | ICD-10-CM | POA: Diagnosis not present

## 2018-09-02 DIAGNOSIS — F419 Anxiety disorder, unspecified: Secondary | ICD-10-CM | POA: Diagnosis not present

## 2018-09-02 DIAGNOSIS — F5104 Psychophysiologic insomnia: Secondary | ICD-10-CM

## 2018-09-02 NOTE — Assessment & Plan Note (Addendum)
Refer to GI for this and the small amount of blood that she has seen though the bleeding seems to be related to constipation.  Discussed that she does need to see them.

## 2018-09-02 NOTE — Assessment & Plan Note (Signed)
Continue Ambien. 

## 2018-09-02 NOTE — Progress Notes (Signed)
Virtual Visit via video Note  This visit type was conducted due to national recommendations for restrictions regarding the COVID-19 pandemic (e.g. social distancing).  This format is felt to be most appropriate for this patient at this time.  All issues noted in this document were discussed and addressed.  No physical exam was performed (except for noted visual exam findings with Video Visits).   I connected with Margaret Silva today at  3:30 PM EDT by a video enabled telemedicine application and verified that I am speaking with the correct person using two identifiers. Location patient: home Location provider: work Persons participating in the virtual visit: patient, provider  I discussed the limitations, risks, security and privacy concerns of performing an evaluation and management service by telephone and the availability of in person appointments. I also discussed with the patient that there may be a patient responsible charge related to this service. The patient expressed understanding and agreed to proceed.   Reason for visit: follow-up  HPI: GERD:   Reflux symptoms: improved since going omeprazole   Abd pain: no   Blood in stool: small amount only when wipes after being constipated and not having a bowel movement for a while  Dysphagia: She does still have some issues with feeling as though food gets stuck when she swallows   EGD: No history of this  Medication: Taking omeprazole  Insomnia: She notes she sleeps fairly well most nights.  The Ambien does help most of the time.  No drowsiness the next day.  No alcohol intake.  Anxiety/depression: She does note some depression at the beginning of the COVID-19 pandemic that that has progressively improved.  She does have some anxiety with work as she is now working from home though that is improving as well.  She is working on building a house and that has been good and something for her to look forward to.  She notes no SI.  She  does take Cymbalta and amitriptyline though notes the Cymbalta is for fibromyalgia and the amitriptyline is for her migraines.    ROS: See pertinent positives and negatives per HPI.  Past Medical History:  Diagnosis Date   Asthma    Bursitis of left hip    Diverticulitis    h/o   Endometriosis    GERD (gastroesophageal reflux disease)    Headache    History of chicken pox    History of colonoscopy 11/04 and April 06   History of laparoscopy 12/04 and 12/07   History of laparoscopy 01/29/2006   Dr. Georga Bora Endometriosis   Hx of migraines    Hx: UTI (urinary tract infection)    Interstitial cystitis 01/03/2004   hydrodistilation   Jaundice    h/o   Plantar fasciitis, bilateral    Pott's disease    Not official but working on getting the diagnosis    Past Surgical History:  Procedure Laterality Date   CYSTOSCOPY WITH HYDRODISTENSION AND BIOPSY     DIAGNOSTIC LAPAROSCOPY  02/16/2003   SIGMOIDOSCOPY  05/26/10    Family History  Problem Relation Age of Onset   Diabetes Maternal Grandmother    Arthritis Maternal Grandmother    Kidney disease Maternal Grandmother    Hypertension Father    Hyperlipidemia Father    Fibromyalgia Mother    Migraines Mother    Cancer Mother 31       breast   Breast cancer Mother    Ehlers-Danlos syndrome Sister    Fibromyalgia Sister  Thyroid cancer Sister    Heart attack Paternal Grandfather    Cancer Paternal Grandfather        Pancreatic/ Bladder Cancer/prostate   Arthritis Paternal Grandfather    Heart disease Paternal Grandfather    Cancer Paternal Grandmother        Liver cancer/lung cancer   Diabetes Paternal Grandmother    Heart attack Maternal Grandfather    Heart disease Maternal Grandfather    Hypertension Maternal Grandfather    Mental illness Maternal Grandfather    Ovarian cancer Neg Hx    Colon cancer Neg Hx     SOCIAL HX: Non-smoker.   Current Outpatient  Medications:    albuterol (PROVENTIL HFA;VENTOLIN HFA) 108 (90 Base) MCG/ACT inhaler, Inhale into the lungs every 6 (six) hours as needed for wheezing or shortness of breath., Disp: , Rfl:    amitriptyline (ELAVIL) 50 MG tablet, Take 1 tablet (50 mg total) by mouth at bedtime., Disp: 90 tablet, Rfl: 4   Armodafinil 50 MG tablet, TAKE 1 TABLET(50 MG) BY MOUTH DAILY (Patient taking differently: Take 50 mg by mouth as needed. ), Disp: 30 tablet, Rfl: 0   Cholecalciferol (VITAMIN D PO), Take 5,000 Int'l Units by mouth once a week., Disp: , Rfl:    diclofenac sodium (VOLTAREN) 1 % GEL, Apply 4 g topically 3 (three) times daily as needed. , Disp: , Rfl:    divalproex (DEPAKOTE) 500 MG DR tablet, Take 1 tablet (500 mg total) by mouth daily., Disp: 90 tablet, Rfl: 4   DULoxetine (CYMBALTA) 30 MG capsule, TAKE 3 CAPSULES BY MOUTH ONCE A DAY, Disp: 270 capsule, Rfl: 1   Fluticasone Propionate, Inhal, (FLOVENT IN), Inhale into the lungs as needed., Disp: , Rfl:    gentamicin cream (GARAMYCIN) 0.1 %, Apply 1 application topically as needed (for nose)., Disp: , Rfl:    HYDROcodone-homatropine (HYCODAN) 5-1.5 MG/5ML syrup, Take 5 mLs by mouth every 8 (eight) hours as needed for cough., Disp: 120 mL, Rfl: 0   Levonorgestrel (LILETTA, 52 MG, IU), by Intrauterine route., Disp: , Rfl:    loratadine (CLARITIN) 10 MG tablet, TAKE 1 TABLET (10 MG TOTAL) BY MOUTH DAILY., Disp: 90 tablet, Rfl: 3   omeprazole (PRILOSEC) 20 MG capsule, Take 1 capsule (20 mg total) by mouth daily., Disp: 90 capsule, Rfl: 3   ondansetron (ZOFRAN-ODT) 4 MG disintegrating tablet, Take 1 tablet (4 mg total) by mouth every 8 (eight) hours as needed for nausea., Disp: 30 tablet, Rfl: 12   valACYclovir (VALTREX) 500 MG tablet, Take 1 tablet (500 mg total) by mouth daily., Disp: 90 tablet, Rfl: 1   zolpidem (AMBIEN CR) 6.25 MG CR tablet, TAKE ONE TABLET BY MOUTH AT BEDTIME AS NEEDED FOR SLEEP, Disp: 30 tablet, Rfl: 0   SUMAtriptan  Succinate (ONZETRA XSAIL) 11 MG/NOSEPC EXHP, Place 1 Dose into both nostrils once for 1 dose. 1 spray per nostril  at onset of migraine, may repeat dose after 2 hours. Max twice daily., Disp: 1 each, Rfl: 11  EXAM:  VITALS per patient if applicable: None.  GENERAL: alert, oriented, appears well and in no acute distress  HEENT: atraumatic, conjunttiva clear, no obvious abnormalities on inspection of external nose and ears  NECK: normal movements of the head and neck  LUNGS: on inspection no signs of respiratory distress, breathing rate appears normal, no obvious gross SOB, gasping or wheezing  CV: no obvious cyanosis  MS: moves all visible extremities without noticeable abnormality  PSYCH/NEURO: pleasant and cooperative, no obvious  depression or anxiety, speech and thought processing grossly intact  ASSESSMENT AND PLAN:  Discussed the following assessment and plan:  Difficulty swallowing Refer to GI for this and the small amount of blood that she has seen though the bleeding seems to be related to constipation.  Discussed that she does need to see them.  GERD (gastroesophageal reflux disease) Symptoms have improved with the exception of dysphagia.  She will see GI.  She will continue omeprazole.  Anxiety Relatively stable.  She will continue to monitor for worsening of this and recurrence of depression.  Chronic insomnia Continue Ambien.   Social distancing precautions and sick precautions given regarding COVID-19.   I discussed the assessment and treatment plan with the patient. The patient was provided an opportunity to ask questions and all were answered. The patient agreed with the plan and demonstrated an understanding of the instructions.   The patient was advised to call back or seek an in-person evaluation if the symptoms worsen or if the condition fails to improve as anticipated.   Tommi Rumps, MD

## 2018-09-02 NOTE — Assessment & Plan Note (Signed)
Symptoms have improved with the exception of dysphagia.  She will see GI.  She will continue omeprazole.

## 2018-09-02 NOTE — Assessment & Plan Note (Signed)
Relatively stable.  She will continue to monitor for worsening of this and recurrence of depression.

## 2018-09-06 ENCOUNTER — Other Ambulatory Visit: Payer: Self-pay | Admitting: Family Medicine

## 2018-09-06 DIAGNOSIS — F419 Anxiety disorder, unspecified: Secondary | ICD-10-CM

## 2018-09-07 ENCOUNTER — Telehealth: Payer: Self-pay | Admitting: Family Medicine

## 2018-09-07 NOTE — Telephone Encounter (Signed)
I called pt and left vm to Return in about 6 months

## 2018-09-08 ENCOUNTER — Other Ambulatory Visit: Payer: Self-pay

## 2018-09-08 LAB — CERVICOVAGINAL ANCILLARY ONLY
Bacterial vaginitis: POSITIVE — AB
Candida vaginitis: NEGATIVE
Trichomonas: NEGATIVE

## 2018-09-08 MED ORDER — METRONIDAZOLE 500 MG PO TABS: 500.0000 mg | ORAL_TABLET | Freq: Two times a day (BID) | ORAL | 0 refills | Status: DC

## 2018-09-08 NOTE — Progress Notes (Signed)
Please contact patient. Swab positive for BV, but not Trich. May have Flagyl, Metrogel, or Solosec as treatment. Thanks, JML

## 2018-09-09 ENCOUNTER — Other Ambulatory Visit: Payer: Self-pay | Admitting: Family Medicine

## 2018-09-09 DIAGNOSIS — B009 Herpesviral infection, unspecified: Secondary | ICD-10-CM

## 2018-09-16 ENCOUNTER — Other Ambulatory Visit: Payer: Self-pay | Admitting: Family Medicine

## 2018-09-16 DIAGNOSIS — F419 Anxiety disorder, unspecified: Secondary | ICD-10-CM

## 2018-09-16 MED ORDER — DULOXETINE HCL 30 MG PO CPEP: ORAL_CAPSULE | ORAL | 1 refills | Status: DC

## 2018-09-16 NOTE — Telephone Encounter (Signed)
Refill sent as requested. 

## 2018-09-16 NOTE — Telephone Encounter (Signed)
Medication Refill - Medication: DULoxetine (CYMBALTA) 30 MG capsule  Has the patient contacted their pharmacy? Yes - told it didn't go through (Agent: If no, request that the patient contact the pharmacy for the refill.) (Agent: If yes, when and what did the pharmacy advise?)  Preferred Pharmacy (with phone number or street name):  [id number] [address], [place] [phone] (Phone) [phone] (Fax)   Agent: Please be advised that RX refills may take up to 3 business days. We ask that you follow-up with your pharmacy.

## 2018-09-19 ENCOUNTER — Ambulatory Visit (INDEPENDENT_AMBULATORY_CARE_PROVIDER_SITE_OTHER): Payer: 59 | Admitting: Gastroenterology

## 2018-09-19 ENCOUNTER — Encounter: Payer: Self-pay | Admitting: Gastroenterology

## 2018-09-19 ENCOUNTER — Other Ambulatory Visit: Payer: Self-pay

## 2018-09-19 VITALS — BP 127/79 | HR 78 | Temp 98.0°F | Ht 64.0 in | Wt 209.4 lb

## 2018-09-19 DIAGNOSIS — K219 Gastro-esophageal reflux disease without esophagitis: Secondary | ICD-10-CM

## 2018-09-19 DIAGNOSIS — R1319 Other dysphagia: Secondary | ICD-10-CM

## 2018-09-19 DIAGNOSIS — R1310 Dysphagia, unspecified: Secondary | ICD-10-CM
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
## 2018-09-19 NOTE — Addendum Note (Signed)
Addended by: Earl Lagos on: 09/19/2018 04:47 PM   Modules accepted: Orders, SmartSet

## 2018-09-21 ENCOUNTER — Other Ambulatory Visit: Payer: Self-pay | Admitting: Family Medicine

## 2018-09-22 NOTE — Telephone Encounter (Signed)
Refilled: 08/18/2018 Last OV: 09/02/2018 Next OV: 03/22/2019

## 2018-09-27 ENCOUNTER — Other Ambulatory Visit: Payer: Self-pay

## 2018-09-27 ENCOUNTER — Other Ambulatory Visit
Admission: RE | Admit: 2018-09-27 | Discharge: 2018-09-27 | Disposition: A | Discharge status: Home / Self Care | Payer: 59 | Source: Ambulatory Visit | Attending: Gastroenterology | Admitting: Gastroenterology

## 2018-09-27 DIAGNOSIS — R1310 Dysphagia, unspecified: Secondary | ICD-10-CM | POA: Insufficient documentation

## 2018-09-27 DIAGNOSIS — K3189 Other diseases of stomach and duodenum: Secondary | ICD-10-CM | POA: Insufficient documentation

## 2018-09-27 DIAGNOSIS — Z20828 Contact with and (suspected) exposure to other viral communicable diseases: Secondary | ICD-10-CM | POA: Insufficient documentation

## 2018-09-27 DIAGNOSIS — Z01812 Encounter for preprocedural laboratory examination: Secondary | ICD-10-CM | POA: Insufficient documentation

## 2018-09-27 DIAGNOSIS — K219 Gastro-esophageal reflux disease without esophagitis: Secondary | ICD-10-CM | POA: Insufficient documentation

## 2018-09-27 DIAGNOSIS — K317 Polyp of stomach and duodenum: Secondary | ICD-10-CM | POA: Diagnosis not present

## 2018-09-27 LAB — SARS CORONAVIRUS 2 (TAT 6-24 HRS): SARS Coronavirus 2: NEGATIVE

## 2018-09-30 ENCOUNTER — Ambulatory Visit: Payer: 59 | Admitting: Anesthesiology

## 2018-09-30 ENCOUNTER — Encounter
Admission: RE | Disposition: A | Discharge status: Home / Self Care | Payer: Self-pay | Source: Home / Self Care | Attending: Gastroenterology

## 2018-09-30 ENCOUNTER — Encounter: Payer: Self-pay | Admitting: *Deleted

## 2018-09-30 ENCOUNTER — Ambulatory Visit
Admission: RE | Admit: 2018-09-30 | Discharge: 2018-09-30 | Disposition: A | Discharge status: Home / Self Care | Payer: 59 | Attending: Gastroenterology | Admitting: Gastroenterology

## 2018-09-30 DIAGNOSIS — K219 Gastro-esophageal reflux disease without esophagitis: Secondary | ICD-10-CM | POA: Insufficient documentation

## 2018-09-30 DIAGNOSIS — K2980 Duodenitis without bleeding: Secondary | ICD-10-CM | POA: Diagnosis not present

## 2018-09-30 DIAGNOSIS — K3189 Other diseases of stomach and duodenum: Secondary | ICD-10-CM | POA: Diagnosis not present

## 2018-09-30 DIAGNOSIS — K317 Polyp of stomach and duodenum: Secondary | ICD-10-CM

## 2018-09-30 DIAGNOSIS — Z79899 Other long term (current) drug therapy: Secondary | ICD-10-CM | POA: Diagnosis not present

## 2018-09-30 DIAGNOSIS — R1310 Dysphagia, unspecified: Secondary | ICD-10-CM | POA: Diagnosis not present

## 2018-09-30 DIAGNOSIS — R1013 Epigastric pain: Secondary | ICD-10-CM | POA: Diagnosis not present

## 2018-09-30 DIAGNOSIS — J45909 Unspecified asthma, uncomplicated: Secondary | ICD-10-CM | POA: Insufficient documentation

## 2018-09-30 DIAGNOSIS — R1319 Other dysphagia: Secondary | ICD-10-CM

## 2018-09-30 HISTORY — DX: Other specified postprocedural states: R11.2

## 2018-09-30 HISTORY — DX: Other specified postprocedural states: Z98.890

## 2018-09-30 HISTORY — PX: ESOPHAGOGASTRODUODENOSCOPY (EGD) WITH PROPOFOL: SHX5813

## 2018-09-30 LAB — POCT PREGNANCY, URINE: Preg Test, Ur: NEGATIVE

## 2018-09-30 SURGERY — ESOPHAGOGASTRODUODENOSCOPY (EGD) WITH PROPOFOL
Anesthesia: General

## 2018-09-30 MED ORDER — PROPOFOL 10 MG/ML IV BOLUS
INTRAVENOUS | Status: DC | PRN
  Administered 2018-09-30 (×2): 40 mg via INTRAVENOUS
  Administered 2018-09-30: 100 mg via INTRAVENOUS

## 2018-09-30 MED ORDER — LIDOCAINE HCL (CARDIAC) PF 100 MG/5ML IV SOSY
PREFILLED_SYRINGE | INTRAVENOUS | Status: DC | PRN
  Administered 2018-09-30: 40 mg via INTRAVENOUS

## 2018-09-30 MED ORDER — SODIUM CHLORIDE 0.9 % IV SOLN
INTRAVENOUS | Status: DC
  Administered 2018-09-30: 1000 mL via INTRAVENOUS

## 2018-09-30 MED ORDER — PROPOFOL 500 MG/50ML IV EMUL
INTRAVENOUS | Status: DC | PRN
  Administered 2018-09-30: 175 ug/kg/min via INTRAVENOUS

## 2018-09-30 MED ORDER — PROPOFOL 10 MG/ML IV BOLUS
INTRAVENOUS | Status: AC
  Filled 2018-09-30: qty 20

## 2018-09-30 MED ORDER — MIDAZOLAM HCL 2 MG/2ML IJ SOLN
INTRAMUSCULAR | Status: AC
  Filled 2018-09-30: qty 2

## 2018-09-30 MED ORDER — FENTANYL CITRATE (PF) 100 MCG/2ML IJ SOLN
INTRAMUSCULAR | Status: DC | PRN
  Administered 2018-09-30: 50 ug via INTRAVENOUS

## 2018-09-30 MED ORDER — MIDAZOLAM HCL 2 MG/2ML IJ SOLN
INTRAMUSCULAR | Status: DC | PRN
  Administered 2018-09-30: 2 mg via INTRAVENOUS

## 2018-09-30 MED ORDER — FENTANYL CITRATE (PF) 100 MCG/2ML IJ SOLN
INTRAMUSCULAR | Status: AC
  Filled 2018-09-30: qty 2

## 2018-09-30 MED ORDER — GLYCOPYRROLATE 0.2 MG/ML IJ SOLN
INTRAMUSCULAR | Status: DC | PRN
  Administered 2018-09-30: 0.2 mg via INTRAVENOUS

## 2018-09-30 NOTE — Anesthesia Procedure Notes (Signed)
Performed by: Daysi Boggan, CRNA Pre-anesthesia Checklist: Patient identified, Emergency Drugs available, Suction available and Patient being monitored Patient Re-evaluated:Patient Re-evaluated prior to induction Oxygen Delivery Method: Nasal cannula Induction Type: IV induction Dental Injury: Teeth and Oropharynx as per pre-operative assessment  Comments: Nasal cannula with etCO2 monitoring       

## 2018-09-30 NOTE — Op Note (Signed)
[facility] Patient Name: Margaret Silva Procedure Date: 09/30/2018 10:57 AM MRN: [id number] Account #: [id number] Date of Birth: 1980-03-11 Admit Type: Outpatient Age: 38 Room: [facility] ENDO ROOM 1 Gender: Female Note Status: Finalized Procedure:            Upper GI endoscopy Indications:          Epigastric abdominal pain Providers:            Analeise Mccleery B. Bonna Gains MD, MD Referring MD:         Angela Adam. Caryl Bis (Referring MD) Medicines:            Monitored Anesthesia Care Complications:        No immediate complications. Procedure:            Pre-Anesthesia Assessment:                       - Prior to the procedure, a History and Physical was                        performed, and patient medications, allergies and                        sensitivities were reviewed. The patient's tolerance of                        previous anesthesia was reviewed.                       - The risks and benefits of the procedure and the                        sedation options and risks were discussed with the                        patient. All questions were answered and informed                        consent was obtained.                       - Patient identification and proposed procedure were                        verified prior to the procedure by the physician, the                        nurse, the anesthesiologist, the anesthetist and the                        technician. The procedure was verified in the procedure                        room.                       - ASA Grade Assessment: II - A patient with mild                        systemic disease.  After obtaining informed consent, the endoscope was                        passed under direct vision. Throughout the procedure,                        the patient's blood pressure, pulse, and oxygen                        saturations were monitored continuously. The Endoscope                       was introduced through the mouth, and advanced to the                        second part of duodenum. The upper GI endoscopy was                        accomplished with ease. The patient tolerated the                        procedure well. Findings:      The examined esophagus was normal. Biopsies were obtained from the       proximal and distal esophagus with cold forceps for histology of       suspected eosinophilic esophagitis.      Patchy mildly erythematous mucosa without bleeding was found in the       gastric antrum. Biopsies were taken with a cold forceps for histology.       Biopsies were obtained in the gastric body, at the incisura and in the       gastric antrum with cold forceps for histology.      Multiple, 5-6 3 to 6 mm sessile polyps with no bleeding and no stigmata       of recent bleeding were found in the gastric fundus and in the gastric       body. Biopsies were taken with a cold forceps for histology. 3 polyps       were biopsied. These were the largest of the polyps present. 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 2 of the polyps that were       biopsied were inadvertently removed during the biopsy as can occur and       is typical of these fundic gland polyps.      The duodenal bulb was normal.      Localized mild mucosal changes characterized by scalloping were found in       the second portion of the duodenum. Biopsies were taken with a cold       forceps for histology. Impression:           - Normal esophagus. Biopsied.                       - Erythematous mucosa in the antrum. Biopsied.                       - Multiple, 5-6 gastric polyps. Biopsied.                       - Normal duodenal bulb.                       -  Mucosal changes in the duodenum. Biopsied.                       - Biopsies were obtained in the gastric body, at the                        incisura and in the gastric antrum. Recommendation:       -  Await pathology results.                       - Consider discontinuing PPI after biopsy results.                       - Discharge patient to home (with escort).                       - Advance diet as tolerated.                       - Continue present medications.                       - Patient has a contact number available for                        emergencies. The signs and symptoms of potential                        delayed complications were discussed with the patient.                        Return to normal activities tomorrow. Written discharge                        instructions were provided to the patient.                       - Discharge patient to home (with escort).                       - The findings and recommendations were discussed with                        the patient.                       - The findings and recommendations were discussed with                        the patient's family. Procedure Code(s):    --- Professional ---                       [phone], Esophagogastroduodenoscopy, flexible, transoral;                        with biopsy, single or multiple Diagnosis Code(s):    --- Professional ---                       K31.89, Other diseases of stomach and duodenum  K31.7, Polyp of stomach and duodenum                       R10.13, Epigastric pain CPT copyright 2019 American Medical Association. All rights reserved. The codes documented in this report are preliminary and upon coder review may  be revised to meet current compliance requirements.  Vonda Antigua, MD Margretta Sidle B. Bonna Gains MD, MD 09/30/2018 11:24:53 AM This report has been signed electronically. Number of Addenda: 0 Note Initiated On: 09/30/2018 10:57 AM Estimated Blood Loss: Estimated blood loss: none.      [facility]

## 2018-09-30 NOTE — Transfer of Care (Signed)
Immediate Anesthesia Transfer of Care Note  Patient: Margaret Silva  Procedure(s) Performed: Procedure(s): ESOPHAGOGASTRODUODENOSCOPY (EGD) WITH PROPOFOL (N/A)  Patient Location: PACU and Endoscopy Unit  Anesthesia Type:General  Level of Consciousness: sedated  Airway & Oxygen Therapy: Patient Spontanous Breathing and Patient connected to nasal cannula oxygen  Post-op Assessment: Report given to RN and Post -op Vital signs reviewed and stable  Post vital signs: Reviewed and stable  Last Vitals:  Vitals:   09/30/18 0953 09/30/18 1118  BP: 135/90 113/75  Pulse: 96 88  Resp: 17 16  Temp: (!) 35.7 C 36.4 C  SpO2: 741% 63%    Complications: No apparent anesthesia complications

## 2018-09-30 NOTE — Anesthesia Preprocedure Evaluation (Addendum)
Anesthesia Evaluation  Patient identified by MRN, date of birth, ID band Patient awake    Reviewed: Allergy & Precautions, H&P , NPO status , Patient's Chart, lab work & pertinent test results, reviewed documented beta blocker date and time   History of Anesthesia Complications (+) PONV and history of anesthetic complications  Airway Mallampati: II   Neck ROM: full    Dental  (+) Poor Dentition   Pulmonary asthma ,    Pulmonary exam normal        Cardiovascular Exercise Tolerance: Good negative cardio ROS Normal cardiovascular exam Rhythm:regular Rate:Normal     Neuro/Psych  Headaches, PSYCHIATRIC DISORDERS Anxiety Depression  Neuromuscular disease negative neurological ROS  negative psych ROS   GI/Hepatic negative GI ROS, Neg liver ROS, GERD  ,  Endo/Other  negative endocrine ROS  Renal/GU negative Renal ROS  negative genitourinary   Musculoskeletal   Abdominal   Peds  Hematology negative hematology ROS (+)   Anesthesia Other Findings Past Medical History: No date: Asthma No date: Bursitis of left hip No date: Diverticulitis     Comment:  h/o No date: Endometriosis No date: GERD (gastroesophageal reflux disease) No date: Headache No date: History of chicken pox 11/04 and April 06: History of colonoscopy 12/04 and 12/07: History of laparoscopy 01/29/2006: History of laparoscopy     Comment:  Dr. Georga Bora Endometriosis No date: Hx of migraines No date: Hx: UTI (urinary tract infection) 01/03/2004: Interstitial cystitis     Comment:  hydrodistilation No date: Jaundice     Comment:  h/o No date: Plantar fasciitis, bilateral No date: Pott's disease     Comment:  Not official but working on getting the diagnosis Past Surgical History: No date: CYSTOSCOPY WITH HYDRODISTENSION AND BIOPSY 02/16/2003: DIAGNOSTIC LAPAROSCOPY 05/26/10: SIGMOIDOSCOPY   Reproductive/Obstetrics negative OB ROS                             Anesthesia Physical Anesthesia Plan  ASA: II  Anesthesia Plan: General   Post-op Pain Management:    Induction:   PONV Risk Score and Plan:   Airway Management Planned:   Additional Equipment:   Intra-op Plan:   Post-operative Plan:   Informed Consent: I have reviewed the patients History and Physical, chart, labs and discussed the procedure including the risks, benefits and alternatives for the proposed anesthesia with the patient or authorized representative who has indicated his/her understanding and acceptance.     Dental Advisory Given  Plan Discussed with: CRNA  Anesthesia Plan Comments:        Anesthesia Quick Evaluation

## 2018-09-30 NOTE — H&P (Signed)
Vonda Antigua, MD [address] [place], [place], [place], [address] [address], [place], [place], [place], [address] Phone: [phone]  Fax: [phone]  Primary Care Physician:  Leone Haven, MD   Pre-Procedure History & Physical: HPI:  Margaret Silva is a 38 y.o. female is here for an EGD.   Past Medical History:  Diagnosis Date  . Asthma   . Bursitis of left hip   . Diverticulitis    h/o  . Endometriosis   . GERD (gastroesophageal reflux disease)   . Headache   . History of chicken pox   . History of colonoscopy 11/04 and April 06  . History of laparoscopy 12/04 and 12/07  . History of laparoscopy 01/29/2006   Dr. Georga Bora Endometriosis  . Hx of migraines   . Hx: UTI (urinary tract infection)   . Interstitial cystitis 01/03/2004   hydrodistilation  . Jaundice    h/o  . Plantar fasciitis, bilateral   . 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 CYSTOSCOPY WITH HYDRODISTENSION AND BIOPSY    . DIAGNOSTIC LAPAROSCOPY  02/16/2003  . SIGMOIDOSCOPY  05/26/10    Prior to Admission medications   Medication Sig Start Date End Date Taking? Authorizing Provider  albuterol (PROVENTIL HFA;VENTOLIN HFA) 108 (90 Base) MCG/ACT inhaler Inhale into the lungs every 6 (six) hours as needed for wheezing or shortness of breath.    [provider]  amitriptyline (ELAVIL) 50 MG tablet Take 1 tablet (50 mg total) by mouth at bedtime. 10/20/17   Melvenia Beam, MD  Armodafinil 50 MG tablet TAKE 1 TABLET(50 MG) BY MOUTH DAILY Patient taking differently: Take 50 mg by mouth as needed.  05/11/18   Ofilia Neas, PA-C  Cholecalciferol (VITAMIN D PO) Take 5,000 Int'l Units by mouth once a week.    [provider]  diclofenac sodium (VOLTAREN) 1 % GEL Apply 4 g topically 3 (three) times daily as needed.     [provider]  divalproex (DEPAKOTE) 500 MG DR  tablet Take 1 tablet (500 mg total) by mouth daily. 10/20/17   Melvenia Beam, MD  DULoxetine (CYMBALTA) 30 MG capsule TAKE 3 CAPSULES BY MOUTH ONCE A DAY 09/16/18   Leone Haven, MD  Fluticasone Propionate, Inhal, (FLOVENT IN) Inhale into the lungs as needed.    [provider]  gentamicin cream (GARAMYCIN) 0.1 % Apply 1 application topically as needed (for nose).    [provider]  HYDROcodone-homatropine (HYCODAN) 5-1.5 MG/5ML syrup Take 5 mLs by mouth every 8 (eight) hours as needed for cough. Patient not taking: Reported on 09/19/2018 05/03/18   Jodelle Green, FNP  Levonorgestrel (LILETTA, 52 MG, IU) by Intrauterine route.    [provider]  loratadine (CLARITIN) 10 MG tablet TAKE 1 TABLET (10 MG TOTAL) BY MOUTH DAILY. 01/27/17   Leone Haven, MD  omeprazole (PRILOSEC) 20 MG capsule Take 1 capsule (20 mg total) by mouth daily. 09/10/17   Leone Haven, MD  ondansetron (ZOFRAN-ODT) 4 MG disintegrating tablet Take 1 tablet (4 mg total) by mouth every 8 (eight) hours as needed for nausea. 08/12/15   Melvenia Beam, MD  valACYclovir (VALTREX) 500 MG tablet TAKE 1 TABLET(500 MG) BY MOUTH DAILY 09/12/18   Leone Haven, MD  zolpidem (AMBIEN CR) 6.25 MG CR tablet TAKE 1 TABLET BY MOUTH NIGHTLY AS NEEDEDFOR SLEEP 09/23/18  Leone Haven, MD    Allergies as of 09/19/2018 - Review Complete 09/19/2018  Allergen Reaction Noted  . Coffee bean extract  [coffea arabica] Nausea Only 09/04/2014  . Other  08/12/2015  . Trazodone and nefazodone Other (See Comments) 08/03/2013    Family History  Problem Relation Age of Onset  . Diabetes Maternal Grandmother   . Arthritis Maternal Grandmother   . Kidney disease Maternal Grandmother   . Hypertension Father   . Hyperlipidemia Father   . Fibromyalgia Mother   . Migraines Mother   . Cancer Mother 44       breast  . Breast cancer Mother   . Ehlers-Danlos syndrome Sister   . Fibromyalgia Sister   .  Thyroid cancer Sister   . Heart attack Paternal Grandfather   . Cancer Paternal Grandfather        Pancreatic/ Bladder Cancer/prostate  . Arthritis Paternal Grandfather   . Heart disease Paternal Grandfather   . Cancer Paternal Grandmother        Liver cancer/lung cancer  . Diabetes Paternal Grandmother   . Heart attack Maternal Grandfather   . Heart disease Maternal Grandfather   . Hypertension Maternal Grandfather   . Mental illness Maternal Grandfather   . Ovarian cancer Neg Hx   . Colon cancer Neg Hx     Social History   Socioeconomic History  . Marital status: Divorced    Spouse name: Not on file  . Number of children: 0  . Years of education: 33   . Highest education level: Not on file  Occupational History  . Occupation: [employer]   Social Needs  . Financial resource strain: Not on file  . Food insecurity    Worry: Not on file    Inability: Not on file  . Transportation needs    Medical: Not on file    Non-medical: Not on file  Tobacco Use  . Smoking status: Never Smoker  . Smokeless tobacco: Never Used  Substance and Sexual Activity  . Alcohol use: Yes    Alcohol/week: 0.0 standard drinks    Comment: occasionally  . Drug use: No  . Sexual activity: Not Currently    Partners: Male    Birth control/protection: I.U.D.  Lifestyle  . Physical activity    Days per week: Not on file    Minutes per session: Not on file  . Stress: Not on file  Relationships  . Social Herbalist on phone: Not on file    Gets together: Not on file    Attends religious service: Not on file    Active member of club or organization: Not on file    Attends meetings of clubs or organizations: Not on file    Relationship status: Not on file  . Intimate partner violence    Fear of current or ex partner: Not on file    Emotionally abused: Not on file    Physically abused: Not on file    Forced sexual activity: Not on file  Other Topics Concern  . Not on  file  Social History Narrative   Lives with dog   Caffeine use:  Generally when she has a headache, probably less than a weekly    Review of Systems: See HPI, otherwise negative ROS  Physical Exam: There were no vitals taken for this visit. General:   Alert,  pleasant and cooperative in NAD Head:  Normocephalic and atraumatic. Neck:  Supple; no masses  or thyromegaly. Lungs:  Clear throughout to auscultation, normal respiratory effort.    Heart:  +S1, +S2, Regular rate and rhythm, No edema. Abdomen:  Soft, nontender and nondistended. Normal bowel sounds, without guarding, and without rebound.   Neurologic:  Alert and  oriented x4;  grossly normal neurologically.  Impression/Plan: Margaret Silva is here for an EGD for dysphagia  Risks, benefits, limitations, and alternatives regarding the procedure have been reviewed with the patient.  Questions have been answered.  All parties agreeable.   Virgel Manifold, MD  09/30/2018, 9:49 AM

## 2018-09-30 NOTE — Anesthesia Post-op Follow-up Note (Signed)
Anesthesia QCDR form completed.        

## 2018-10-03 ENCOUNTER — Encounter: Payer: Self-pay | Admitting: Gastroenterology

## 2018-10-04 LAB — SURGICAL PATHOLOGY

## 2018-10-05 NOTE — Anesthesia Postprocedure Evaluation (Signed)
Anesthesia Post Note  Patient: Margaret Silva  Procedure(s) Performed: ESOPHAGOGASTRODUODENOSCOPY (EGD) WITH PROPOFOL (N/A )  Patient location during evaluation: PACU Anesthesia Type: General Level of consciousness: awake and alert Pain management: pain level controlled Vital Signs Assessment: post-procedure vital signs reviewed and stable Respiratory status: spontaneous breathing, nonlabored ventilation, respiratory function stable and patient connected to nasal cannula oxygen Cardiovascular status: blood pressure returned to baseline and stable Postop Assessment: no apparent nausea or vomiting Anesthetic complications: no     Last Vitals:  Vitals:   09/30/18 1128 09/30/18 1138  BP: 118/82 122/89  Pulse: 97 91  Resp: 16 (!) 21  Temp:    SpO2: 95% 95%    Last Pain:  Vitals:   09/30/18 1138  TempSrc:   PainSc: 0-No pain                 Molli Barrows

## 2018-10-10 ENCOUNTER — Telehealth: Payer: Self-pay | Admitting: Gastroenterology

## 2018-10-10 NOTE — Telephone Encounter (Signed)
Patient called wanted to know her results from her EGD 09-30-18. 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

## 2018-10-10 NOTE — Telephone Encounter (Signed)
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

## 2018-10-10 NOTE — Telephone Encounter (Signed)
Result: Debbie please let patient know, the biopsy results were benign. The polyps were in her stomach were benign. If she is still having trouble swallowing, I would recommend a manometry study. If she is agreeable, please refer.  Called patient and left a message for call back

## 2018-10-10 NOTE — Telephone Encounter (Signed)
Called and left a detail message of results informed patient to give [place] a call back to lets [place] know if she want [place] to refer her for this study at [facility]

## 2018-10-10 NOTE — Telephone Encounter (Signed)
Called and left a message for call back  

## 2018-10-12 NOTE — Telephone Encounter (Signed)
Called and patient states she will give [place] a call if she decides to have this done
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
## 2018-10-22 ENCOUNTER — Other Ambulatory Visit: Payer: Self-pay | Admitting: Family Medicine

## 2018-10-26 ENCOUNTER — Ambulatory Visit: Payer: 59 | Admitting: Neurology

## 2018-10-28 ENCOUNTER — Other Ambulatory Visit: Payer: Self-pay | Admitting: Neurology

## 2018-10-28 ENCOUNTER — Other Ambulatory Visit: Payer: Self-pay | Admitting: Family Medicine

## 2018-10-31 ENCOUNTER — Encounter: Payer: Self-pay | Admitting: Physician Assistant

## 2018-10-31 ENCOUNTER — Ambulatory Visit (INDEPENDENT_AMBULATORY_CARE_PROVIDER_SITE_OTHER): Payer: 59 | Admitting: Physician Assistant

## 2018-10-31 ENCOUNTER — Other Ambulatory Visit: Payer: Self-pay

## 2018-10-31 VITALS — BP 113/78 | HR 87 | Resp 14 | Ht 64.0 in | Wt 213.2 lb

## 2018-10-31 DIAGNOSIS — M722 Plantar fascial fibromatosis: Secondary | ICD-10-CM

## 2018-10-31 DIAGNOSIS — M7062 Trochanteric bursitis, left hip: Secondary | ICD-10-CM

## 2018-10-31 DIAGNOSIS — Z8659 Personal history of other mental and behavioral disorders: Secondary | ICD-10-CM

## 2018-10-31 DIAGNOSIS — G2581 Restless legs syndrome: Secondary | ICD-10-CM

## 2018-10-31 DIAGNOSIS — K588 Other irritable bowel syndrome: Secondary | ICD-10-CM

## 2018-10-31 DIAGNOSIS — M170 Bilateral primary osteoarthritis of knee: Secondary | ICD-10-CM | POA: Diagnosis not present

## 2018-10-31 DIAGNOSIS — I498 Other specified cardiac arrhythmias: Secondary | ICD-10-CM

## 2018-10-31 DIAGNOSIS — R5383 Other fatigue: Secondary | ICD-10-CM

## 2018-10-31 DIAGNOSIS — Z8669 Personal history of other diseases of the nervous system and sense organs: Secondary | ICD-10-CM

## 2018-10-31 DIAGNOSIS — M797 Fibromyalgia: Secondary | ICD-10-CM

## 2018-10-31 DIAGNOSIS — M62838 Other muscle spasm: Secondary | ICD-10-CM

## 2018-10-31 DIAGNOSIS — F5104 Psychophysiologic insomnia: Secondary | ICD-10-CM | POA: Diagnosis not present

## 2018-10-31 DIAGNOSIS — I951 Orthostatic hypotension: Secondary | ICD-10-CM

## 2018-10-31 DIAGNOSIS — G90A Postural orthostatic tachycardia syndrome (POTS): Secondary | ICD-10-CM

## 2018-10-31 DIAGNOSIS — Z87440 Personal history of urinary (tract) infections: Secondary | ICD-10-CM

## 2018-10-31 DIAGNOSIS — R000 Tachycardia, unspecified: Secondary | ICD-10-CM

## 2018-10-31 MED ORDER — LIDOCAINE HCL 1 % IJ SOLN
0.5000 mL | INTRAMUSCULAR | Status: AC | PRN
  Administered 2018-10-31: .5 mL

## 2018-10-31 MED ORDER — TRIAMCINOLONE ACETONIDE 40 MG/ML IJ SUSP
10.0000 mg | INTRAMUSCULAR | Status: AC | PRN
  Administered 2018-10-31: 10 mg via INTRAMUSCULAR

## 2018-11-01 ENCOUNTER — Other Ambulatory Visit: Payer: Self-pay | Admitting: Neurology

## 2018-11-01 DIAGNOSIS — G43009 Migraine without aura, not intractable, without status migrainosus: Secondary | ICD-10-CM

## 2018-11-07 ENCOUNTER — Other Ambulatory Visit: Payer: Self-pay

## 2018-11-07 ENCOUNTER — Ambulatory Visit (INDEPENDENT_AMBULATORY_CARE_PROVIDER_SITE_OTHER): Payer: 59 | Admitting: Family Medicine

## 2018-11-07 DIAGNOSIS — J040 Acute laryngitis: Secondary | ICD-10-CM | POA: Diagnosis not present

## 2018-11-07 MED ORDER — PREDNISONE 10 MG (21) PO TBPK: ORAL_TABLET | ORAL | 0 refills | Status: DC

## 2018-11-07 NOTE — Progress Notes (Signed)
Patient ID: Margaret Silva, female   DOB: Dec 20, 1980, 38 y.o.   MRN: [id number]    Virtual Visit via video Note  This visit type was conducted due to national recommendations for restrictions regarding the COVID-19 pandemic (e.g. social distancing).  This format is felt to be most appropriate for this patient at this time.  All issues noted in this document were discussed and addressed.  No physical exam was performed (except for noted visual exam findings with Video Visits).   I connected with Margaret Silva today at  3:00 PM EDT by a video enabled telemedicine application or telephone and verified that I am speaking with the correct person using two identifiers. Location patient: home Location provider: work or home office Persons participating in the virtual visit: patient, provider  I discussed the limitations, risks, security and privacy concerns of performing an evaluation and management service by video and the availability of in person appointments. I also discussed with the patient that there may be a patient responsible charge related to this service. The patient expressed understanding and agreed to proceed.  HPI:  Patient and I connected via video due to complaint of loss of voice.  Patient has had this occur before ended up being treated with oral steroids to reduce inflammation/laryngitis.  Patient does take allergy medication orally and nasal spray due to the seasonal allergies.  Patient talks on the phone for her job, so when she loses her voice it is extremely noticeable.  It is hard for patient to rest her voice due to her job.  No fever or chills.  No cough, shortness of breath or wheezing.  No body aches.  No chest pain.  No GI or GU complaints.  Denies pain in throat or difficulty swallowing.  Denies any white spots in back of throat.  Denies any drooling.  Patient has been working from home and when she does leave the home always wears her mask and is diligent  with handwashing.  Denies any concern for exposure to COVID-19.   ROS: See pertinent positives and negatives per HPI.  Past Medical History:  Diagnosis Date  . Asthma   . Bursitis of left hip   . Diverticulitis    h/o  . Endometriosis   . GERD (gastroesophageal reflux disease)   . Headache   . History of chicken pox   . History of colonoscopy 11/04 and April 06  . History of laparoscopy 12/04 and 12/07  . History of laparoscopy 01/29/2006   Dr. Georga Bora Endometriosis  . Hx of migraines   . Hx: UTI (urinary tract infection)   . Interstitial cystitis 01/03/2004   hydrodistilation  . Jaundice    h/o  . Plantar fasciitis, bilateral   . PONV (postoperative nausea and vomiting)   . 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 CYSTOSCOPY WITH HYDRODISTENSION AND BIOPSY    . DIAGNOSTIC LAPAROSCOPY  02/16/2003  . ESOPHAGOGASTRODUODENOSCOPY (EGD) WITH PROPOFOL N/A 09/30/2018   Procedure: ESOPHAGOGASTRODUODENOSCOPY (EGD) WITH PROPOFOL;  Surgeon: Virgel Manifold, MD;  Location: [facility] ENDOSCOPY;  Service: Endoscopy;  Laterality: N/A;  . SIGMOIDOSCOPY  05/26/10    Family History  Problem Relation Age of Onset  . Diabetes Maternal Grandmother   . Arthritis Maternal Grandmother   . Kidney disease Maternal Grandmother   . Hypertension Father   . Hyperlipidemia Father   . Fibromyalgia Mother   . Migraines  Mother   . Cancer Mother 83       breast  . Breast cancer Mother   . Ehlers-Danlos syndrome Sister   . Fibromyalgia Sister   . Thyroid cancer Sister   . Heart attack Paternal Grandfather   . Cancer Paternal Grandfather        Pancreatic/ Bladder Cancer/prostate  . Arthritis Paternal Grandfather   . Heart disease Paternal Grandfather   . Cancer Paternal Grandmother        Liver cancer/lung cancer  . Diabetes Paternal Grandmother   . Heart attack Maternal Grandfather   . Heart disease  Maternal Grandfather   . Hypertension Maternal Grandfather   . Mental illness Maternal Grandfather   . Ovarian cancer Neg Hx   . Colon cancer Neg Hx     Social History   Tobacco Use  . Smoking status: Never Smoker  . Smokeless tobacco: Never Used  Substance Use Topics  . Alcohol use: Yes    Alcohol/week: 0.0 standard drinks    Comment: occasionally     Current Outpatient Medications:  .  albuterol (PROVENTIL HFA;VENTOLIN HFA) 108 (90 Base) MCG/ACT inhaler, Inhale into the lungs every 6 (six) hours as needed for wheezing or shortness of breath., Disp: , Rfl:  .  amitriptyline (ELAVIL) 50 MG tablet, TAKE 1 TABLET BY MOUTH AT BEDTIME, Disp: 90 tablet, Rfl: 4 .  Armodafinil 50 MG tablet, TAKE 1 TABLET(50 MG) BY MOUTH DAILY (Patient taking differently: Take 50 mg by mouth as needed. ), Disp: 30 tablet, Rfl: 0 .  Cholecalciferol (VITAMIN D PO), Take 5,000 Int'l Units by mouth once a week., Disp: , Rfl:  .  diclofenac sodium (VOLTAREN) 1 % GEL, Apply 4 g topically 3 (three) times daily as needed. , Disp: , Rfl:  .  divalproex (DEPAKOTE) 500 MG DR tablet, TAKE 1 TABLET BY MOUTH DAILY, Disp: 90 tablet, Rfl: 4 .  DULoxetine (CYMBALTA) 30 MG capsule, TAKE 3 CAPSULES BY MOUTH ONCE A DAY, Disp: 270 capsule, Rfl: 1 .  Fluticasone Propionate, Inhal, (FLOVENT IN), Inhale into the lungs as needed., Disp: , Rfl:  .  gentamicin cream (GARAMYCIN) 0.1 %, Apply 1 application topically as needed (for nose)., Disp: , Rfl:  .  HYDROcodone-homatropine (HYCODAN) 5-1.5 MG/5ML syrup, Take 5 mLs by mouth every 8 (eight) hours as needed for cough., Disp: 120 mL, Rfl: 0 .  Levonorgestrel (LILETTA, 52 MG, IU), by Intrauterine route., Disp: , Rfl:  .  loratadine (CLARITIN) 10 MG tablet, TAKE 1 TABLET (10 MG TOTAL) BY MOUTH DAILY., Disp: 90 tablet, Rfl: 3 .  omeprazole (PRILOSEC) 20 MG capsule, TAKE 1 CAPSULE(20 MG) BY MOUTH DAILY, Disp: 90 capsule, Rfl: 3 .  ondansetron (ZOFRAN-ODT) 4 MG disintegrating tablet, Take 1  tablet (4 mg total) by mouth every 8 (eight) hours as needed for nausea., Disp: 30 tablet, Rfl: 12 .  valACYclovir (VALTREX) 500 MG tablet, TAKE 1 TABLET(500 MG) BY MOUTH DAILY, Disp: 90 tablet, Rfl: 1 .  zolpidem (AMBIEN CR) 6.25 MG CR tablet, TAKE 1 TABLET BY MOUTH NIGHTLY AS NEEDEDFOR SLEEP, Disp: 30 tablet, Rfl: 1  EXAM:   GENERAL: alert, oriented, appears well and in no acute distress  HEENT: atraumatic, conjunttiva clear, no obvious abnormalities on inspection of external nose and ears. Voice is scratchy and cracks at times.   NECK: normal movements of the head and neck  LUNGS: on inspection no signs of respiratory distress, breathing rate appears normal, no obvious gross SOB, gasping or wheezing  CV:  no obvious cyanosis  MS: moves all visible extremities without noticeable abnormality  PSYCH/NEURO: pleasant and cooperative, no obvious depression or anxiety, speech and thought processing grossly intact  ASSESSMENT AND PLAN:  Discussed the following assessment and plan:  Laryngitis - suspect patient is having a flareup of laryngitis.  She will continue to do oral antihistamine and nasal spray every day to combat seasonal allergies.  She will do high-dose oral steroid taper.  She will rest voice as much as possible.  Advised to drink warm liquids, do salt water gargles.  Patient is wondering if she needs to take off multiple days from work, but states due to her job they force her to use short-term disability and that does not kick in until being out for 6 days in a row (has to use her built up paid time off for those 6 days before STD kicks in).  Advised patient that I think 1 or 2 days of voice rest should be sufficient and we should not have to go to short-term disability; advised patient I would hate for her to use up all of her time off to reach short-term disability and then end up not needing short-term disability.  Patient agrees, states she took today off and maybe will take  tomorrow off and she will see how she is feeling.  Advised patient to let me know if she does decide to move forward with short-term disability and I can fill out paperwork for her.  I discussed the assessment and treatment plan with the patient. The patient was provided an opportunity to ask questions and all were answered. The patient agreed with the plan and demonstrated an understanding of the instructions.   The patient was advised to call back or seek an in-person evaluation if the symptoms worsen or if the condition fails to improve as anticipated.   Jodelle Green, FNP

## 2018-11-15 ENCOUNTER — Telehealth: Payer: Self-pay | Admitting: Family Medicine

## 2018-11-15 NOTE — Telephone Encounter (Signed)
Pt saw Guse last week for a virtual. Pt stated that Lauren said she would fill out these papers for her insurance. BMI papers are up front in color folder.

## 2018-11-15 NOTE — Telephone Encounter (Signed)
Papers handed to NP Philis Nettle

## 2018-11-17 DIAGNOSIS — Z0279 Encounter for issue of other medical certificate: Secondary | ICD-10-CM

## 2018-11-21 ENCOUNTER — Other Ambulatory Visit: Payer: Self-pay | Admitting: Family Medicine

## 2018-11-21 DIAGNOSIS — R05 Cough: Secondary | ICD-10-CM

## 2018-11-21 DIAGNOSIS — J040 Acute laryngitis: Secondary | ICD-10-CM

## 2018-11-21 DIAGNOSIS — R059 Cough, unspecified: Secondary | ICD-10-CM

## 2018-11-21 NOTE — Telephone Encounter (Signed)
Medication: predniSONE (STERAPRED UNI-PAK 21 TAB) 10 MG (21) TBPK tablet /HYDROcodone-homatropine (HYCODAN) 5-1.5 MG/5ML syrup /Pt is still experiencing symptoms and is requesting refill for prednisone and cough syrup (prescribed by Lauren originally). Please advise          Has the patient contacted their pharmacy? Yes.   (Agent: If no, request that the patient contact the pharmacy for the refill.)  (Agent: If yes, when and what did the pharmacy advise?)    Preferred Pharmacy (with phone number or street name): [id number] - [place], [place] [address]  [address] [place] [address]  Phone: [phone] Fax: [phone]        Agent: Please be advised that RX refills may take up to 3 business days. We ask that you follow-up with your pharmacy.

## 2018-11-21 NOTE — Telephone Encounter (Signed)
Requested medication (s) are due for refill today: yes  Requested medication (s) are on the active medication list: yes  Last refill:  05/03/2018  Future visit scheduled: yes  Notes to clinic:  Refill cannot be delegated    Requested Prescriptions  Pending Prescriptions Disp Refills   HYDROcodone-homatropine (HYCODAN) 5-1.5 MG/5ML syrup 120 mL 0    Sig: Take 5 mLs by mouth every 8 (eight) hours as needed for cough.     Off-Protocol Failed - 11/21/2018 11:01 AM      Failed - Medication not assigned to a protocol, review manually.      Passed - Valid encounter within last 12 months    Recent Outpatient Visits          2 weeks ago [place] Guse, Jacquelynn Cree, FNP   2 months ago Dysphagia, unspecified type   [place] Sonnenberg, Angela Adam, MD   7 months ago Swallowing problem   [place] Guse, Jacquelynn Cree, FNP   7 months ago Cough in adult   [place] Guse, Jacquelynn Cree, FNP   8 months ago [place] Caryl Bis, Angela Adam, MD      Future Appointments            In 4 months Caryl Bis, Angela Adam, MD [facility] [place]   In 5 months [place] Geni Bers, PA-C [place]   In 9 months [place], Lara Mulch, CNM [facility]            predniSONE (STERAPRED UNI-PAK 21 TAB) 10 MG (21) TBPK tablet 21 tablet 0    Sig: Take according to pack instructions     Not Delegated - Endocrinology:  Oral Corticosteroids Failed - 11/21/2018 11:01 AM      Failed - This refill cannot be delegated      Passed - Last BP in normal range    BP Readings from Last 1 Encounters:  10/31/18 113/78         Passed - Valid encounter within last 6 months    Recent Outpatient Visits          2 weeks ago [place] Guse, Jacquelynn Cree, FNP   2 months ago Dysphagia, unspecified type   [facility]  [place], Angela Adam, MD   7 months ago Swallowing problem   [place], FNP   7 months ago Cough in adult   [facility] Guse, Jacquelynn Cree, FNP   8 months ago [place] Sonnenberg, Angela Adam, MD      Future Appointments            In 4 months Caryl Bis, Angela Adam, MD [place], [place]   In 5 months [place] Sky[place] Bl[facility] [place]   In 9 months [place], Lara Mulch, CNM [facility]

## 2018-11-21 NOTE — Telephone Encounter (Signed)
Pt is still experiencing symptoms and is requesting refill for prednisone and cough syrup (prescribed by Lauren originally). Please advise

## 2018-11-28 ENCOUNTER — Other Ambulatory Visit: Payer: Self-pay

## 2018-11-28 ENCOUNTER — Ambulatory Visit: Payer: 59 | Admitting: Neurology

## 2018-11-28 ENCOUNTER — Encounter: Payer: Self-pay | Admitting: Neurology

## 2018-11-28 VITALS — BP 116/76 | HR 84 | Temp 97.3°F | Ht 64.0 in | Wt 210.0 lb

## 2018-11-28 DIAGNOSIS — G43009 Migraine without aura, not intractable, without status migrainosus: Secondary | ICD-10-CM | POA: Diagnosis not present

## 2018-11-28 DIAGNOSIS — E669 Obesity, unspecified: Secondary | ICD-10-CM | POA: Diagnosis not present

## 2018-11-28 MED ORDER — DICLOFENAC POTASSIUM(MIGRAINE) 50 MG PO PACK: 50.0000 mg | PACK | Freq: Once | ORAL | 11 refills | Status: DC | PRN

## 2018-11-28 MED ORDER — DIVALPROEX SODIUM 500 MG PO DR TAB: 500.0000 mg | DELAYED_RELEASE_TABLET | Freq: Every day | ORAL | 4 refills | Status: DC

## 2018-11-28 MED ORDER — AMITRIPTYLINE HCL 50 MG PO TABS: 50.0000 mg | ORAL_TABLET | Freq: Every day | ORAL | 4 refills | Status: DC

## 2018-11-28 MED ORDER — ONDANSETRON 4 MG PO TBDP: 4.0000 mg | ORAL_TABLET | Freq: Three times a day (TID) | ORAL | 12 refills | Status: DC | PRN

## 2018-11-28 NOTE — Addendum Note (Signed)
Addended by: Sarina Ill B on: 11/28/2018 01:25 PM   Modules accepted: Level of Service

## 2018-11-28 NOTE — Progress Notes (Signed)
[facility]    Provider:  Dr Jaynee Eagles Referring Provider: Leone Haven, MD Primary Care Physician:  Leone Haven, MD  CC:  Headaches and migraines  Interval history 11/28/2018: She is here for follow up of migranies. Migraines have been better. She has switched jobs. She is building a house. Switched jobs, she is at [employer] are better. Only one a month and cambia helps.    Interval history: Tried botox and had reactions(difficulty swallowing, shortness of breath, sore throat, pulse in the 100's , cough), tried Depakote in the past (she cannot get pregnant), She has used onzetra in the past and worked well and cambia works well.  She has a new job. She is still on Depakote. Migraines are still ongoing. She would like a screen protector note for her computer bc it is causing her migraines. Her migraines are improved. 4 migraine days a month treated with excedrin and sometimes cambia. Continue the Cambia and try to get the Hundred. Refill medications.   HPI:  Margaret Silva is a 38 y.o. female here as a referral from Dr. Caryl Bis for headaches. Past medical history migraines, fibromyalgia, interstitial cystitis, diverticulitis, endometriosis, ADHD, dyslexia, restless legs, bursitis, plantar fasciitis, IBS. Migraines Since a teenager. She is on Cymbalta which helps. She works around a lot of dyes which have exacerbated her migraines. Migraines are on the right side. It is sharp, pounding, going up the neck on the back, sometimes the other side. Lots of sound and light sensitivity. Lots of nausea. Occurs several times a week, lasts all day. She has headaches every day, 30 headache days a month, At least 8 are bad migraine days. Has tried TCA, verapamil,propanolol. She is in the lab now which may be better as less triggers. Cymbalta was just increased. She takes Tylenol 3-4x a week only, no medication overuse . Shutting herslef off in a dark room helps. No  aura. Computer overuse makes it worse. She takes nothing for the nausea. Sumatriptan used to work not anymore. Zomig nasal and the cambia helped. Sometimes the migraines are immediate but usually a more gradual increase in severity. The low-level headaches are usually as she go to work every day. The headaches are at the base of the skull, all day long, 1-2 level, She does not take the robaxin. No other focal neurologic deficits or complaints.  Currently: cymbalta, imitrex, on Depakote, topamax.  Has tried verapamil, propranolol, amitriptyline in the past. She has also tried naproxen, ibuprofen, indomethacin.  Reviewed notes, labs and imaging from outside physicians, which showed: Reviewed notes from [facility]. Patient has fibromyalgia syndrome. Fibromyalgia has been active. Her generalized pain scale of 0-10 has been about 6-8 in her fatigue has been about 7. She has plantar fasciitis in her left foot going for a few months now. She had cortisone injections, podiatrist. She also had physical therapy which she has not done yet that she has been doing some splinting at night also some exercises without much result. She went on a mission trip to [place] and since then has been having pain in her left hip and bilateral knee joints without joint swelling. She has a history of morning stiffness, discomfort walking and doing routine activities. She is 16 out of 18 points positive on her fibromyalgia. Her fibromyalgia is active with generalized pain and positive tender points. She has mild osteoarthritis. She is overweight. Weight loss, diet, exercise and muscle strengthening exercises were discussed. IT band exercises and muscle strengthening  exercises were discussed. She was referred to physical therapy for plantar fasciitis and left trochanteric bursitis. She also has insomnia, history of IBS, bilateral trapezius muscle spasms.  Reviewed image reports. X-rays of left hip joint 2 views,  were within normal limits. X-rays of bilateral knee joints, 2 views, showed mild medial compartment narrowing and some lateral osteophytes with mild patellofemoral narrowing without any chondrocalcinosis.  Reviewed lab reports, CMP was normal with normal creatinine 0.94. This was taken on January 2016.  Cholesterol high T10, LDL elevated at 133, TSH within normal limits, CBC normal.  Review of Systems: Patient complains of symptoms per HPI as well as the following symptoms: Fatigue, chest pain, palpitations, blurred vision, shortness of breath, wheezing, snoring, feeling cold, joint pain, aching muscles, allergies, frequent infection, headache, numbness, weakness, dizziness, insomnia, sleepiness, restless legs. Pertinent negatives per HPI. All others negative.   Social History   Socioeconomic History  . Marital status: Divorced    Spouse name: Not on file  . Number of children: 0  . Years of education: 76   . Highest education level: Not on file  Occupational History  . Occupation: [employer]   Social Needs  . Financial resource strain: Not on file  . Food insecurity    Worry: Not on file    Inability: Not on file  . Transportation needs    Medical: Not on file    Non-medical: Not on file  Tobacco Use  . Smoking status: Never Smoker  . Smokeless tobacco: Never Used  Substance and Sexual Activity  . Alcohol use: Yes    Alcohol/week: 0.0 standard drinks    Comment: occasionally  . Drug use: No  . Sexual activity: Not Currently    Partners: Male    Birth control/protection: I.U.D.  Lifestyle  . Physical activity    Days per week: Not on file    Minutes per session: Not on file  . Stress: Not on file  Relationships  . Social Herbalist on phone: Not on file    Gets together: Not on file    Attends religious service: Not on file    Active member of club or organization: Not on file    Attends meetings of clubs or organizations: Not on file     Relationship status: Not on file  . Intimate partner violence    Fear of current or ex partner: Not on file    Emotionally abused: Not on file    Physically abused: Not on file    Forced sexual activity: Not on file  Other Topics Concern  . Not on file  Social History Narrative   Lives with dog   Caffeine use:  Generally when she has a headache, probably less than  weekly    Family History  Problem Relation Age of Onset  . Diabetes Maternal Grandmother   . Arthritis Maternal Grandmother   . Kidney disease Maternal Grandmother   . Hypertension Father   . Hyperlipidemia Father   . Fibromyalgia Mother   . Migraines Mother   . Cancer Mother 45       breast  . Breast cancer Mother   . Ehlers-Danlos syndrome Sister   . Fibromyalgia Sister   . Thyroid cancer Sister   . Heart attack Paternal Grandfather   . Cancer Paternal Grandfather        Pancreatic/ Bladder Cancer/prostate  . Arthritis Paternal Grandfather   . Heart disease Paternal Grandfather   .  Cancer Paternal Grandmother        Liver cancer/lung cancer  . Diabetes Paternal Grandmother   . Heart attack Maternal Grandfather   . Heart disease Maternal Grandfather   . Hypertension Maternal Grandfather   . Mental illness Maternal Grandfather   . Ovarian cancer Neg Hx   . Colon cancer Neg Hx     Past Medical History:  Diagnosis Date  . Asthma   . Bursitis of left hip   . Diverticulitis    h/o  . Endometriosis   . GERD (gastroesophageal reflux disease)   . Headache   . History of chicken pox   . History of colonoscopy 11/04 and April 06  . History of laparoscopy 12/04 and 12/07  . History of laparoscopy 01/29/2006   Dr. Georga Bora Endometriosis  . Hx of migraines   . Hx: UTI (urinary tract infection)   . Interstitial cystitis 01/03/2004   hydrodistilation  . Jaundice    h/o  . Plantar fasciitis, bilateral   . PONV (postoperative nausea and vomiting)   . 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 CYSTOSCOPY WITH HYDRODISTENSION AND BIOPSY    . DIAGNOSTIC LAPAROSCOPY  02/16/2003  . ESOPHAGOGASTRODUODENOSCOPY (EGD) WITH PROPOFOL N/A 09/30/2018   Procedure: ESOPHAGOGASTRODUODENOSCOPY (EGD) WITH PROPOFOL;  Surgeon: Virgel Manifold, MD;  Location: [facility] ENDOSCOPY;  Service: Endoscopy;  Laterality: N/A;  . SIGMOIDOSCOPY  05/26/10    Current Outpatient Medications  Medication Sig Dispense Refill  . albuterol (PROVENTIL HFA;VENTOLIN HFA) 108 (90 Base) MCG/ACT inhaler Inhale into the lungs every 6 (six) hours as needed for wheezing or shortness of breath.    Marland Kitchen amitriptyline (ELAVIL) 50 MG tablet Take 1 tablet (50 mg total) by mouth at bedtime. 90 tablet 4  . Armodafinil 50 MG tablet TAKE 1 TABLET(50 MG) BY MOUTH DAILY (Patient taking differently: Take 50 mg by mouth as needed. ) 30 tablet 0  . Cholecalciferol (VITAMIN D PO) Take 5,000 Int'l Units by mouth once a week.    . diclofenac sodium (VOLTAREN) 1 % GEL Apply 4 g topically 3 (three) times daily as needed.     . divalproex (DEPAKOTE) 500 MG DR tablet Take 1 tablet (500 mg total) by mouth daily. 90 tablet 4  . DULoxetine (CYMBALTA) 30 MG capsule TAKE 3 CAPSULES BY MOUTH ONCE A DAY 270 capsule 1  . Fluticasone Propionate, Inhal, (FLOVENT IN) Inhale into the lungs as needed.    Marland Kitchen gentamicin cream (GARAMYCIN) 0.1 % Apply 1 application topically as needed (for nose).    . Levonorgestrel (LILETTA, 52 MG, IU) by Intrauterine route.    . loratadine (CLARITIN) 10 MG tablet TAKE 1 TABLET (10 MG TOTAL) BY MOUTH DAILY. 90 tablet 3  . omeprazole (PRILOSEC) 20 MG capsule TAKE 1 CAPSULE(20 MG) BY MOUTH DAILY 90 capsule 3  . ondansetron (ZOFRAN-ODT) 4 MG disintegrating tablet Take 1 tablet (4 mg total) by mouth every 8 (eight) hours as needed for nausea. 30 tablet 12  . valACYclovir (VALTREX) 500 MG tablet TAKE 1 TABLET(500 MG) BY MOUTH DAILY 90 tablet 1  . zolpidem  (AMBIEN CR) 6.25 MG CR tablet TAKE 1 TABLET BY MOUTH NIGHTLY AS NEEDEDFOR SLEEP 30 tablet 1  . Diclofenac Potassium 50 MG PACK Take 50 mg by mouth once as needed. For migraine. 9 each 11  . HYDROcodone-homatropine (HYCODAN) 5-1.5 MG/5ML syrup Take 5 mLs by mouth every 8 (  eight) hours as needed for cough. (Patient not taking: Reported on 11/28/2018) 120 mL 0  . predniSONE (STERAPRED UNI-PAK 21 TAB) 10 MG (21) TBPK tablet Take according to pack instructions (Patient not taking: Reported on 11/28/2018) 21 tablet 0   No current facility-administered medications for this visit.     Allergies as of 11/28/2018 - Review Complete 11/28/2018  Allergen Reaction Noted  . Coffee bean extract  [coffea arabica] Nausea Only 09/04/2014  . Other  08/12/2015  . Trazodone and nefazodone Other (See Comments) 08/03/2013    Vitals: BP 116/76 (BP Location: Right Arm, Patient Position: Sitting)   Pulse 84   Temp (!) 97.3 F (36.3 C) Comment: taken at front door  Ht 5\' 4"  (1.626 m)   Wt 210 lb (95.3 kg)   BMI 36.05 kg/m  Last Weight:  Wt Readings from Last 1 Encounters:  11/28/18 210 lb (95.3 kg)   Last Height:   Ht Readings from Last 1 Encounters:  11/28/18 5\' 4"  (1.626 m)   Physical exam: Exam: Gen: NAD, conversant, well nourised,  well groomed                     CV: RRR, no MRG. No Carotid Bruits. No peripheral edema, warm, nontender Eyes: Conjunctivae clear without exudates or hemorrhage  Neuro: Detailed Neurologic Exam  Speech:    Speech is normal; fluent and spontaneous with normal comprehension.  Cognition:    The patient is oriented to person, place, and time;     recent and remote memory intact;     language fluent;     normal attention, concentration,     fund of knowledge Cranial Nerves:    The pupils are equal, round, and reactive to light. The fundi are normal and spontaneous venous pulsations are present. Visual fields are full to finger confrontation. Extraocular movements are  intact. Trigeminal sensation is intact and the muscles of mastication are normal. The face is symmetric. The palate elevates in the midline. Hearing intact. Voice is normal. Shoulder shrug is normal. The tongue has normal motion without fasciculations.   Coordination:    Normal finger to nose and heel to shin. Normal rapid alternating movements.   Gait:    Heel-toe and tandem gait are normal.   Motor Observation:    No asymmetry, no atrophy, and no involuntary movements noted. Tone:    Normal muscle tone.    Posture:    Posture is normal. normal erect    Strength:    Strength is V/V in the upper and lower limbs.      Sensation: intact to LT     Reflex Exam:  DTR's:    Deep tendon reflexes in the upper and lower extremities are normal bilaterally.   Toes:    The toes are downgoing bilaterally.   Clonus:    Clonus is absent.       Assessment/Plan:  38 year old with chronic migraines, non-intractable, without status migrainosus, without aura.Discussed obesity, diet, exercise, will refer to healthy wellness center.   Obesity and migraines: healthy weight and weight wellness center  Acute management migraines: cambia, zofran (refill) Preventative: Depakote, Amitriptyline at night, may help with migraines as well as fibromyalgia and insomnia.  Meds ordered this encounter  Medications  . ondansetron (ZOFRAN-ODT) 4 MG disintegrating tablet    Sig: Take 1 tablet (4 mg total) by mouth every 8 (eight) hours as needed for nausea.    Dispense:  30 tablet    Refill:  12  . divalproex (DEPAKOTE) 500 MG DR tablet    Sig: Take 1 tablet (500 mg total) by mouth daily.    Dispense:  90 tablet    Refill:  4  . amitriptyline (ELAVIL) 50 MG tablet    Sig: Take 1 tablet (50 mg total) by mouth at bedtime.    Dispense:  90 tablet    Refill:  4  . Diclofenac Potassium 50 MG PACK    Sig: Take 50 mg by mouth once as needed. For migraine.    Dispense:  9 each    Refill:  11    To  prevent or relieve headaches, try the following: Cool Compress. Lie down and place a cool compress on your head.  Avoid headache triggers. If certain foods or odors seem to have triggered your migraines in the past, avoid them. A headache diary might help you identify triggers.  Include physical activity in your daily routine. Try a daily walk or other moderate aerobic exercise.  Manage stress. Find healthy ways to cope with the stressors, such as delegating tasks on your to-do list.  Practice relaxation techniques. Try deep breathing, yoga, massage and visualization.  Eat regularly. Eating regularly scheduled meals and maintaining a healthy diet might help prevent headaches. Also, drink plenty of fluids.  Follow a regular sleep schedule. Sleep deprivation might contribute to headaches Consider biofeedback. With this mind-body technique, you learn to control certain bodily functions - such as muscle tension, heart rate and blood pressure - to prevent headaches or reduce headache pain.    Proceed to emergency room if you experience new or worsening symptoms or symptoms do not resolve, if you have new neurologic symptoms or if headache is severe, or for any concerning symptom.   Cc: Dr. Irine Seal, MD  [facility] [address] [place], [place] [address]  Phone [phone] Fax [phone]  A total of 15 minutes was spent face-to-face with this patient. Over half this time was spent on counseling patient on the  1. Migraine without aura and without status migrainosus, not intractable   2. Obesity (BMI 30-39.9)     diagnosis and different diagnostic and therapeutic options, counseling and coordination of care, risks ans benefits of management, compliance, or risk factor reduction and education.

## 2018-11-28 NOTE — Telephone Encounter (Signed)
These were short term medications prescribed by Lauren to treat an acute issue. If the patient feels she needs refills she will need to be seen again to determine if they are appropriate.

## 2018-11-29 NOTE — Telephone Encounter (Signed)
I finished paper work before my vacation.   Sharee Pimple -- do you have paperwork?

## 2018-11-29 NOTE — Telephone Encounter (Signed)
Pt calling to check to see if paperwork is completed. Please advise

## 2018-11-29 NOTE — Telephone Encounter (Signed)
Pt calling to check to see if paperwork is completed. Please advise Ramondo Dietze,cma

## 2018-12-01 ENCOUNTER — Other Ambulatory Visit: Payer: Self-pay | Admitting: Family Medicine

## 2018-12-01 NOTE — Telephone Encounter (Signed)
Called Pt No answer, left a VM telling Pt she can pick up her forms at our office.

## 2018-12-15 ENCOUNTER — Telehealth: Payer: Self-pay | Admitting: Physician Assistant

## 2018-12-15 NOTE — Telephone Encounter (Signed)
I called the patient to discuss the medical assessment that her employer is requesting prior to ordering a stand up desk for the patient.  I spoke with Dr. Estanislado Pandy, and she will be unable to fill out this assessment.  I offered a referral to physical therapy to have this form completed but the patient declined.  I offered to write a letter directly to her employer to approve the stand up desk, and the patient would like proceed with this option first.  We will fax the letter directly to the fax number provided.    Hazel Sams, PA-C

## 2018-12-21 ENCOUNTER — Other Ambulatory Visit: Payer: Self-pay | Admitting: Family Medicine

## 2019-01-26 ENCOUNTER — Other Ambulatory Visit: Payer: Self-pay | Admitting: Family Medicine

## 2019-03-06 ENCOUNTER — Other Ambulatory Visit: Payer: Self-pay | Admitting: Family Medicine

## 2019-03-07 ENCOUNTER — Other Ambulatory Visit: Payer: Self-pay | Admitting: Lab

## 2019-03-22 ENCOUNTER — Telehealth: Payer: Self-pay | Admitting: Family Medicine

## 2019-03-22 ENCOUNTER — Encounter: Payer: Self-pay | Admitting: Family Medicine

## 2019-03-22 ENCOUNTER — Other Ambulatory Visit: Payer: Self-pay

## 2019-03-22 ENCOUNTER — Ambulatory Visit (INDEPENDENT_AMBULATORY_CARE_PROVIDER_SITE_OTHER): Payer: Managed Care, Other (non HMO) | Admitting: Family Medicine

## 2019-03-22 VITALS — Ht 64.0 in | Wt 213.0 lb

## 2019-03-22 DIAGNOSIS — F419 Anxiety disorder, unspecified: Secondary | ICD-10-CM

## 2019-03-22 DIAGNOSIS — K219 Gastro-esophageal reflux disease without esophagitis: Secondary | ICD-10-CM

## 2019-03-22 DIAGNOSIS — F3340 Major depressive disorder, recurrent, in remission, unspecified: Secondary | ICD-10-CM

## 2019-03-22 DIAGNOSIS — A6000 Herpesviral infection of urogenital system, unspecified: Secondary | ICD-10-CM

## 2019-03-22 DIAGNOSIS — R7303 Prediabetes: Secondary | ICD-10-CM

## 2019-03-22 DIAGNOSIS — E785 Hyperlipidemia, unspecified: Secondary | ICD-10-CM

## 2019-03-22 DIAGNOSIS — F5104 Psychophysiologic insomnia: Secondary | ICD-10-CM

## 2019-03-22 MED ORDER — DULOXETINE HCL 30 MG PO CPEP: ORAL_CAPSULE | ORAL | 1 refills | Status: DC

## 2019-03-22 NOTE — Assessment & Plan Note (Signed)
Asymptomatic.  Continue Cymbalta.

## 2019-03-22 NOTE — Assessment & Plan Note (Signed)
Asymptomatic.  Continue Valtrex.

## 2019-03-22 NOTE — Assessment & Plan Note (Signed)
No depression.  Continue Cymbalta.

## 2019-03-22 NOTE — Telephone Encounter (Signed)
lvm to set up 48m follow up

## 2019-03-22 NOTE — Progress Notes (Signed)
Virtual Visit via video Note  This visit type was conducted due to national recommendations for restrictions regarding the COVID-19 pandemic (e.g. social distancing).  This format is felt to be most appropriate for this patient at this time.  All issues noted in this document were discussed and addressed.  No physical exam was performed (except for noted visual exam findings with Video Visits).   I connected with Margaret Silva today at  8:30 AM EST by a video enabled telemedicine application and verified that I am speaking with the correct person using two identifiers. Location patient: home Location provider: work Persons participating in the virtual visit: patient, provider  I discussed the limitations, risks, security and privacy concerns of performing an evaluation and management service by telephone and the availability of in person appointments. I also discussed with the patient that there may be a patient responsible charge related to this service. The patient expressed understanding and agreed to proceed.  Reason for visit: follow-up  HPI: GERD: Taking omeprazole.  No reflux, abdominal pain, or blood in her stool.  She had an EGD last year for dysphagia and notes the dysphagia has improved for the most part though she does occasionally still get choked on water and food.  No obvious cause was found for her dysphagia.  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  Insomnia: Continues on Ambien.  She takes this most nights.  Typically gets 7 to 9 hours of sleep.  No drowsiness the next day.  No alcohol intake.  Depression/anxiety: She denies any symptoms.  No SI.  She continues on Cymbalta.  General herpes: History of this in the past.  She takes Valtrex daily for suppression.  She has not had a flare  in several years.   ROS: See pertinent positives and negatives per HPI.  Past Medical History:  Diagnosis Date  . Asthma   . Bursitis of left hip   . Diverticulitis    h/o  . Endometriosis   . GERD (gastroesophageal reflux disease)   . Headache   . History of chicken pox   . History of colonoscopy 11/04 and April 06  . History of laparoscopy 12/04 and 12/07  . History of laparoscopy 01/29/2006   Dr. Georga Bora Endometriosis  . Hx of migraines   . Hx: UTI (urinary tract infection)   . Interstitial cystitis 01/03/2004   hydrodistilation  . Jaundice    h/o  . Plantar fasciitis, bilateral   . PONV (postoperative nausea and vomiting)   . 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 CYSTOSCOPY WITH HYDRODISTENSION AND BIOPSY    . DIAGNOSTIC LAPAROSCOPY  02/16/2003  . ESOPHAGOGASTRODUODENOSCOPY (EGD) WITH PROPOFOL N/A 09/30/2018   Procedure: ESOPHAGOGASTRODUODENOSCOPY (EGD) WITH PROPOFOL;  Surgeon: Virgel Manifold, MD;  Location: [facility] ENDOSCOPY;  Service: Endoscopy;  Laterality: N/A;  . SIGMOIDOSCOPY  05/26/10    Family History  Problem Relation Age of Onset  . Diabetes Maternal Grandmother   . Arthritis Maternal Grandmother   . Kidney disease Maternal Grandmother   . Hypertension Father   . Hyperlipidemia Father   . Fibromyalgia Mother   . Migraines Mother   . Cancer Mother 45       breast  . Breast cancer Mother   .  Ehlers-Danlos syndrome Sister   . Fibromyalgia Sister   . Thyroid cancer Sister   . Heart attack Paternal Grandfather   . Cancer Paternal Grandfather        Pancreatic/ Bladder Cancer/prostate  . Arthritis Paternal Grandfather   . Heart disease Paternal Grandfather   . Cancer Paternal Grandmother        Liver cancer/lung cancer  . Diabetes Paternal Grandmother   . Heart attack Maternal Grandfather   . Heart disease Maternal Grandfather   . Hypertension Maternal  Grandfather   . Mental illness Maternal Grandfather   . Ovarian cancer Neg Hx   . Colon cancer Neg Hx     SOCIAL HX: Non-smoker   Current Outpatient Medications:  .  albuterol (PROVENTIL HFA;VENTOLIN HFA) 108 (90 Base) MCG/ACT inhaler, Inhale into the lungs every 6 (six) hours as needed for wheezing or shortness of breath., Disp: , Rfl:  .  amitriptyline (ELAVIL) 50 MG tablet, Take 1 tablet (50 mg total) by mouth at bedtime., Disp: 90 tablet, Rfl: 4 .  Armodafinil 50 MG tablet, TAKE 1 TABLET(50 MG) BY MOUTH DAILY (Patient taking differently: Take 50 mg by mouth as needed. ), Disp: 30 tablet, Rfl: 0 .  Cholecalciferol (VITAMIN D PO), Take 5,000 Int'l Units by mouth once a week., Disp: , Rfl:  .  Diclofenac Potassium 50 MG PACK, Take 50 mg by mouth once as needed. For migraine., Disp: 9 each, Rfl: 11 .  diclofenac sodium (VOLTAREN) 1 % GEL, Apply 4 g topically 3 (three) times daily as needed. , Disp: , Rfl:  .  divalproex (DEPAKOTE) 500 MG DR tablet, Take 1 tablet (500 mg total) by mouth daily., Disp: 90 tablet, Rfl: 4 .  DULoxetine (CYMBALTA) 30 MG capsule, TAKE 3 CAPSULES BY MOUTH ONCE A DAY, Disp: 270 capsule, Rfl: 1 .  Fluticasone Propionate, Inhal, (FLOVENT IN), Inhale into the lungs as needed., Disp: , Rfl:  .  gentamicin cream (GARAMYCIN) 0.1 %, Apply 1 application topically as needed (for nose)., Disp: , Rfl:  .  Levonorgestrel (LILETTA, 52 MG, IU), by Intrauterine route., Disp: , Rfl:  .  loratadine (CLARITIN) 10 MG tablet, TAKE 1 TABLET (10 MG TOTAL) BY MOUTH DAILY., Disp: 90 tablet, Rfl: 3 .  omeprazole (PRILOSEC) 20 MG capsule, TAKE 1 CAPSULE(20 MG) BY MOUTH DAILY, Disp: 90 capsule, Rfl: 3 .  ondansetron (ZOFRAN-ODT) 4 MG disintegrating tablet, Take 1 tablet (4 mg total) by mouth every 8 (eight) hours as needed for nausea., Disp: 30 tablet, Rfl: 12 .  predniSONE (STERAPRED UNI-PAK 21 TAB) 10 MG (21) TBPK tablet, Take according to pack instructions, Disp: 21 tablet, Rfl: 0 .   valACYclovir (VALTREX) 500 MG tablet, TAKE 1 TABLET(500 MG) BY MOUTH DAILY, Disp: 90 tablet, Rfl: 1 .  zolpidem (AMBIEN CR) 6.25 MG CR tablet, TAKE ONE TABLET AT BEDTIME IF NEEDED FORSLEEP, Disp: 30 tablet, Rfl: 1  EXAM:  VITALS per patient if applicable:  GENERAL: alert, oriented, appears well and in no acute distress  HEENT: atraumatic, conjunttiva clear, no obvious abnormalities on inspection of external nose and ears  NECK: normal movements of the head and neck  LUNGS: on inspection no signs of respiratory distress, breathing rate appears normal, no obvious gross SOB, gasping or wheezing  CV: no obvious cyanosis  MS: moves all visible extremities without noticeable abnormality  PSYCH/NEURO: pleasant and cooperative, no obvious depression or anxiety, speech and thought processing grossly intact  ASSESSMENT AND PLAN:  Discussed the following assessment and plan:  GERD (gastroesophageal reflux disease) Trouble swallowing has improved from previously.  She will continue omeprazole at this time.  Genital herpes Asymptomatic.  Continue Valtrex.  Anxiety Asymptomatic.  Continue Cymbalta.  Chronic insomnia Doing well on Ambien.  She can continue this medication.  Major depressive disorder in remission No depression.  Continue Cymbalta.   Orders Placed This Encounter  Procedures  . Lipid panel  . Comp Met (CMET)  . HgB A1c    Meds ordered this encounter  Medications  . DULoxetine (CYMBALTA) 30 MG capsule    Sig: TAKE 3 CAPSULES BY MOUTH ONCE A DAY    Dispense:  270 capsule    Refill:  1     I discussed the assessment and treatment plan with the patient. The patient was provided an opportunity to ask questions and all were answered. The patient agreed with the plan and demonstrated an understanding of the instructions.   The patient was advised to call back or seek an in-person evaluation if the symptoms worsen or if the condition fails to improve as  anticipated.    Tommi Rumps, MD

## 2019-03-22 NOTE — Assessment & Plan Note (Signed)
Doing well on Ambien.  She can continue this medication.

## 2019-03-22 NOTE — Assessment & Plan Note (Signed)
Trouble swallowing has improved from previously.  She will continue omeprazole at this time.

## 2019-04-07 ENCOUNTER — Other Ambulatory Visit: Payer: Self-pay | Admitting: Family Medicine
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
## 2019-05-01 ENCOUNTER — Other Ambulatory Visit: Payer: Self-pay

## 2019-05-01 ENCOUNTER — Encounter: Payer: Self-pay | Admitting: Physician Assistant

## 2019-05-01 ENCOUNTER — Ambulatory Visit (INDEPENDENT_AMBULATORY_CARE_PROVIDER_SITE_OTHER): Payer: Managed Care, Other (non HMO) | Admitting: Physician Assistant

## 2019-05-01 VITALS — BP 143/80 | HR 91 | Resp 14 | Ht 64.0 in | Wt 210.6 lb

## 2019-05-01 DIAGNOSIS — M7062 Trochanteric bursitis, left hip: Secondary | ICD-10-CM

## 2019-05-01 DIAGNOSIS — M797 Fibromyalgia: Secondary | ICD-10-CM | POA: Diagnosis not present

## 2019-05-01 DIAGNOSIS — K588 Other irritable bowel syndrome: Secondary | ICD-10-CM

## 2019-05-01 DIAGNOSIS — M722 Plantar fascial fibromatosis: Secondary | ICD-10-CM

## 2019-05-01 DIAGNOSIS — Z8669 Personal history of other diseases of the nervous system and sense organs: Secondary | ICD-10-CM

## 2019-05-01 DIAGNOSIS — Z8659 Personal history of other mental and behavioral disorders: Secondary | ICD-10-CM

## 2019-05-01 DIAGNOSIS — F5104 Psychophysiologic insomnia: Secondary | ICD-10-CM

## 2019-05-01 DIAGNOSIS — M62838 Other muscle spasm: Secondary | ICD-10-CM | POA: Diagnosis not present

## 2019-05-01 DIAGNOSIS — G90A Postural orthostatic tachycardia syndrome (POTS): Secondary | ICD-10-CM

## 2019-05-01 DIAGNOSIS — R5383 Other fatigue: Secondary | ICD-10-CM

## 2019-05-01 DIAGNOSIS — I498 Other specified cardiac arrhythmias: Secondary | ICD-10-CM

## 2019-05-01 DIAGNOSIS — G2581 Restless legs syndrome: Secondary | ICD-10-CM

## 2019-05-01 DIAGNOSIS — M170 Bilateral primary osteoarthritis of knee: Secondary | ICD-10-CM

## 2019-05-01 DIAGNOSIS — Z87440 Personal history of urinary (tract) infections: Secondary | ICD-10-CM

## 2019-05-01 MED ORDER — TRIAMCINOLONE ACETONIDE 40 MG/ML IJ SUSP
10.0000 mg | INTRAMUSCULAR | Status: AC | PRN
  Administered 2019-05-01: 10 mg via INTRAMUSCULAR

## 2019-05-01 MED ORDER — LIDOCAINE HCL 1 % IJ SOLN
0.5000 mL | INTRAMUSCULAR | Status: AC | PRN
  Administered 2019-05-01: .5 mL

## 2019-05-12 ENCOUNTER — Other Ambulatory Visit: Payer: Self-pay | Admitting: Family Medicine

## 2019-05-18 ENCOUNTER — Other Ambulatory Visit: Payer: Self-pay | Admitting: Family Medicine

## 2019-05-18 DIAGNOSIS — B009 Herpesviral infection, unspecified: Secondary | ICD-10-CM

## 2019-05-25 ENCOUNTER — Other Ambulatory Visit: Payer: Self-pay

## 2019-05-25 ENCOUNTER — Ambulatory Visit: Payer: Managed Care, Other (non HMO) | Admitting: Dermatology

## 2019-05-25 DIAGNOSIS — L609 Nail disorder, unspecified: Secondary | ICD-10-CM | POA: Diagnosis not present

## 2019-05-25 DIAGNOSIS — L918 Other hypertrophic disorders of the skin: Secondary | ICD-10-CM | POA: Diagnosis not present

## 2019-05-25 DIAGNOSIS — L821 Other seborrheic keratosis: Secondary | ICD-10-CM

## 2019-05-25 DIAGNOSIS — Z1283 Encounter for screening for malignant neoplasm of skin: Secondary | ICD-10-CM | POA: Diagnosis not present

## 2019-05-25 DIAGNOSIS — D225 Melanocytic nevi of trunk: Secondary | ICD-10-CM | POA: Diagnosis not present

## 2019-05-25 DIAGNOSIS — B079 Viral wart, unspecified: Secondary | ICD-10-CM | POA: Diagnosis not present

## 2019-05-25 DIAGNOSIS — L578 Other skin changes due to chronic exposure to nonionizing radiation: Secondary | ICD-10-CM

## 2019-05-25 DIAGNOSIS — L219 Seborrheic dermatitis, unspecified: Secondary | ICD-10-CM

## 2019-05-25 DIAGNOSIS — L905 Scar conditions and fibrosis of skin: Secondary | ICD-10-CM

## 2019-05-25 DIAGNOSIS — D229 Melanocytic nevi, unspecified: Secondary | ICD-10-CM

## 2019-05-25 DIAGNOSIS — L814 Other melanin hyperpigmentation: Secondary | ICD-10-CM

## 2019-05-25 DIAGNOSIS — D1800 Hemangioma unspecified site: Secondary | ICD-10-CM
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
## 2019-05-25 NOTE — Patient Instructions (Addendum)
Melanoma ABCDEs  Melanoma is the most dangerous type of skin cancer, and is the leading cause of death from skin disease.  You are more likely to develop melanoma if you:  Have light-colored skin, light-colored eyes, or red or blond hair  Spend a lot of time in the sun  Tan regularly, either outdoors or in a tanning bed  Have had blistering sunburns, especially during childhood  Have a close family member who has had a melanoma  Have atypical moles or large birthmarks  Early detection of melanoma is key since treatment is typically straightforward and cure rates are extremely high if we catch it early.   The first sign of melanoma is often a change in a mole or a new dark spot.  The ABCDE system is a way of remembering the signs of melanoma.  A for asymmetry:  The two halves do not match. B for border:  The edges of the growth are irregular. C for color:  A mixture of colors are present instead of an even brown color. D for diameter:  Melanomas are usually (but not always) greater than 41mm - the size of a pencil eraser. E for evolution:  The spot keeps changing in size, shape, and color.  Please check your skin once per month between visits. You can use a small mirror in front and a large mirror behind you to keep an eye on the back side or your body.   If you see any new or changing lesions before your next follow-up, please call to schedule a visit.  Please continue daily skin protection including broad spectrum sunscreen SPF 30+ to sun-exposed areas, reapplying every 2 hours as needed when you're outdoors.    Liquid nitrogen was applied for 10-12 seconds to the skin lesion and the expected blistering or scabbing reaction explained. Do not pick at the area. Patient reminded to expect hypopigmented scars from the procedure. Return if lesion fails to fully resolve.

## 2019-06-01 ENCOUNTER — Encounter: Payer: Self-pay | Admitting: Dermatology

## 2019-06-09 ENCOUNTER — Other Ambulatory Visit: Payer: Self-pay | Admitting: Family Medicine

## 2019-07-11 ENCOUNTER — Other Ambulatory Visit: Payer: Self-pay | Admitting: Family Medicine

## 2019-07-11 NOTE — Telephone Encounter (Signed)
Refill request for Margaret Silva, last seen 03-22-19, last filled 06-09-19.  Please advise.

## 2019-08-09 ENCOUNTER — Other Ambulatory Visit: Payer: Self-pay | Admitting: Family Medicine

## 2019-08-17 IMAGING — RF ESOPHAGUS/BARIUM SWALLOW/TABLET STUDY
14 of 18 series · 14 of 24 positions shown · non-contrast
Comparison: None.

CLINICAL DATA: Dysphagia

EXAM:
ESOPHOGRAM / BARIUM SWALLOW / BARIUM TABLET STUDY
TECHNIQUE: Combined double contrast and single contrast examination performed
using effervescent crystals, thick barium liquid, and thin barium
liquid. The patient was observed with fluoroscopy swallowing a 13 mm
barium sulphate tablet.
FLUOROSCOPY TIME:  Fluoroscopy Time:  [DATE]
Radiation Exposure Index (if provided by the fluoroscopic device):
880 mgy cm2
Number of Acquired Spot Images: 24

[Series 1: cp_standard · 0.18mm/px · 1 of 1 slices shown (1 of 2)]
[im 1/1]
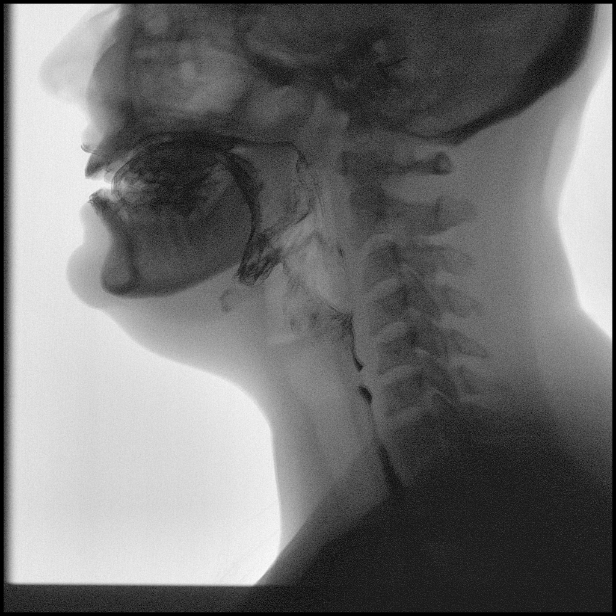

[Series 2: fluoro_barium 2fps_bw · 0.18mm/px · 1 of 2 frames shown (1 of 12)]
[frame 2/2]
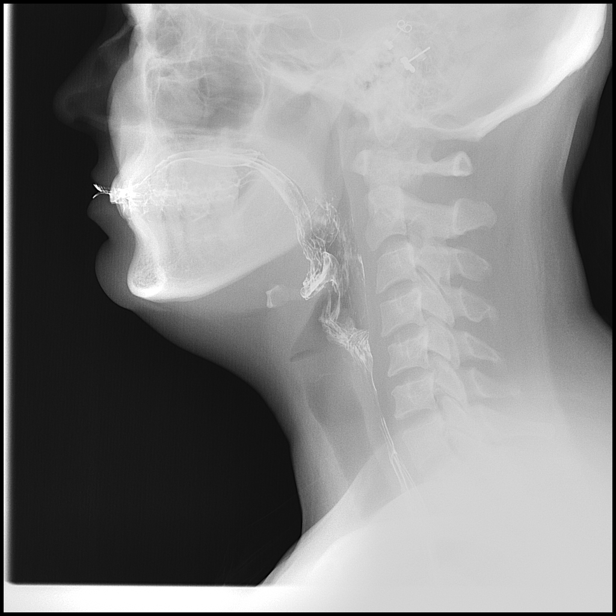

[Series 3: fluoro_barium 2fps_bw · 0.18mm/px · 1 of 2 frames shown (2 of 12)]
[frame 2/2]
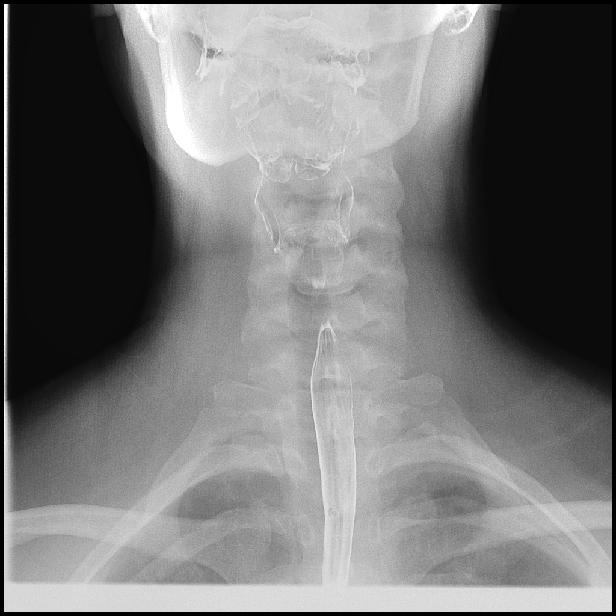

[Series 4: fluoro_barium 2fps_bw · 0.18mm/px · 1 of 2 frames shown (3 of 12)]
[frame 2/2]
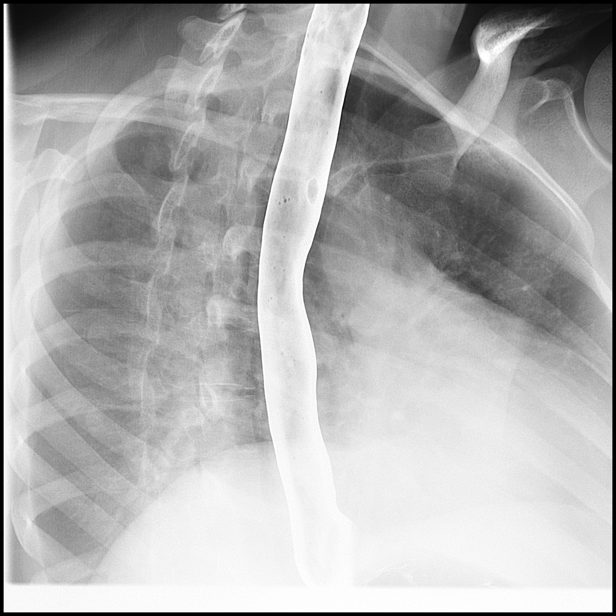

[Series 5: fluoro_barium 2fps_bw · 0.18mm/px · 1 of 2 frames shown (4 of 12)]
[frame 1/2]
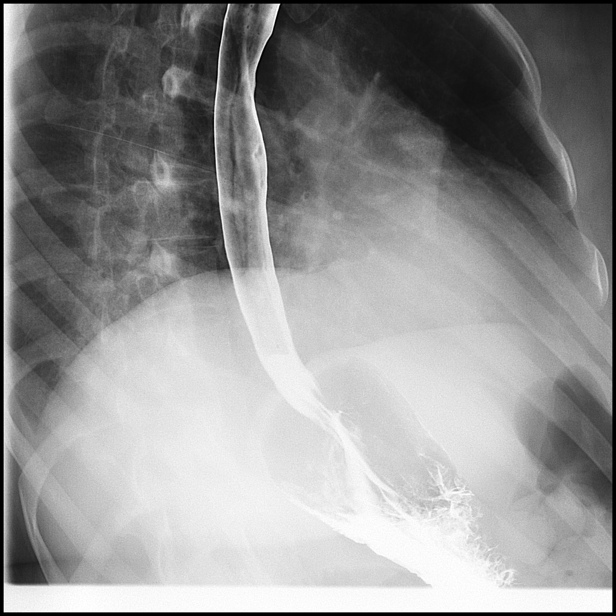

[Series 6: fluoro_barium 2fps_bw · 0.18mm/px · 1 of 2 frames shown (5 of 12)]
[frame 1/2]
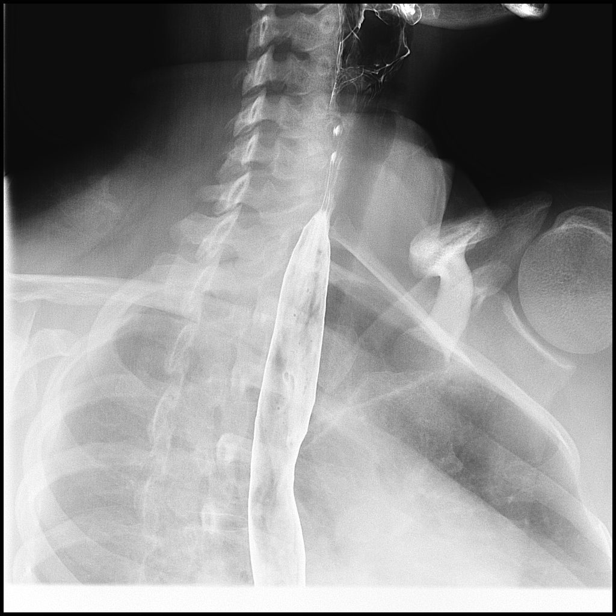

[Series 7: fluoro_barium 2fps_bw · 0.18mm/px · 1 of 1 slices shown (6 of 12)]
[im 1/1]
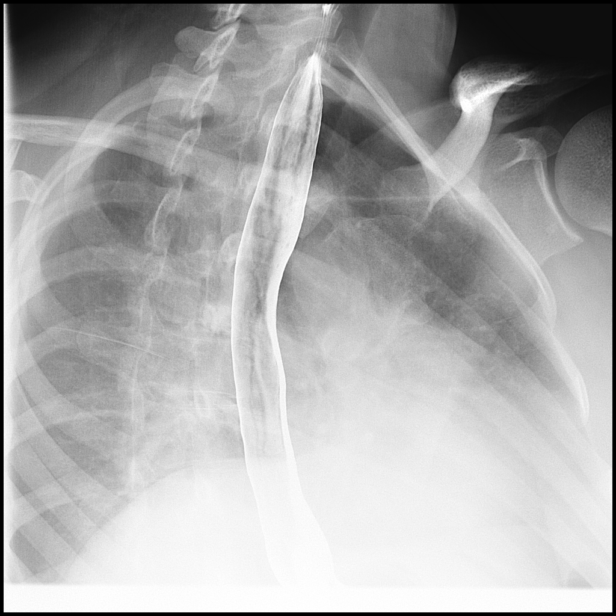

[Series 8: fluoro_barium 2fps_bw · 0.18mm/px · 1 of 2 frames shown (7 of 12)]
[frame 1/2]
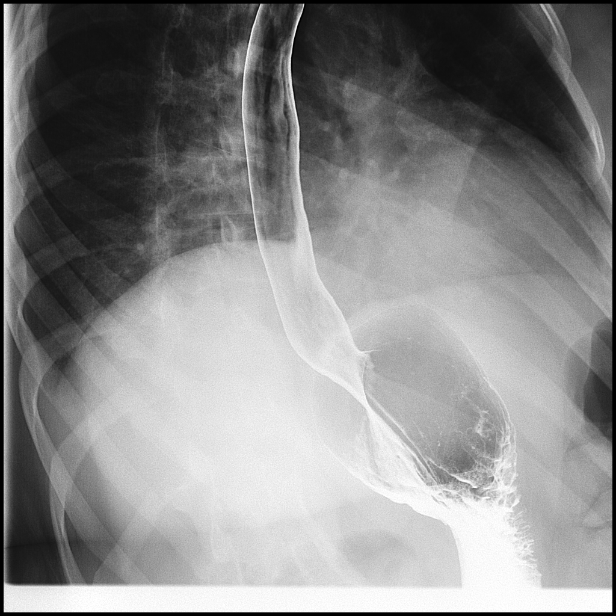

[Series 9: fluoro_barium 2fps_bw · 0.18mm/px · 1 of 1 slices shown (8 of 12)]
[im 1/1]
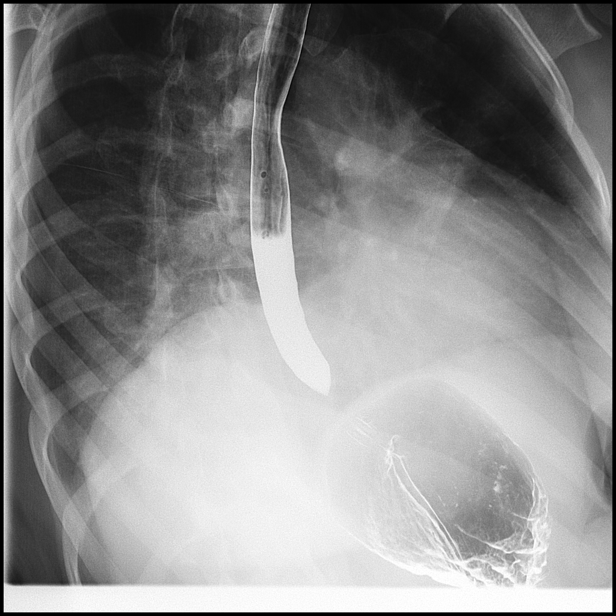

[Series 11: cp_standard · 0.18mm/px · 1 of 1 slices shown (2 of 2)]
[im 1/1]
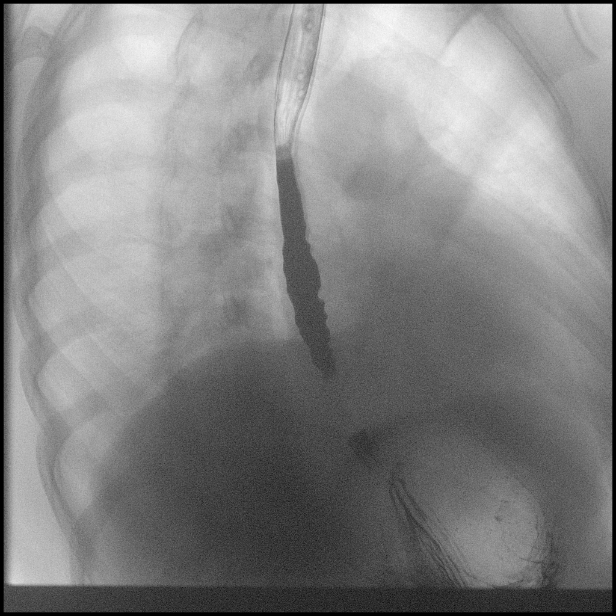

[Series 13: fluoro_barium 2fps_bw · 0.18mm/px · 1 of 1 slices shown (9 of 12)]
[im 1/1]
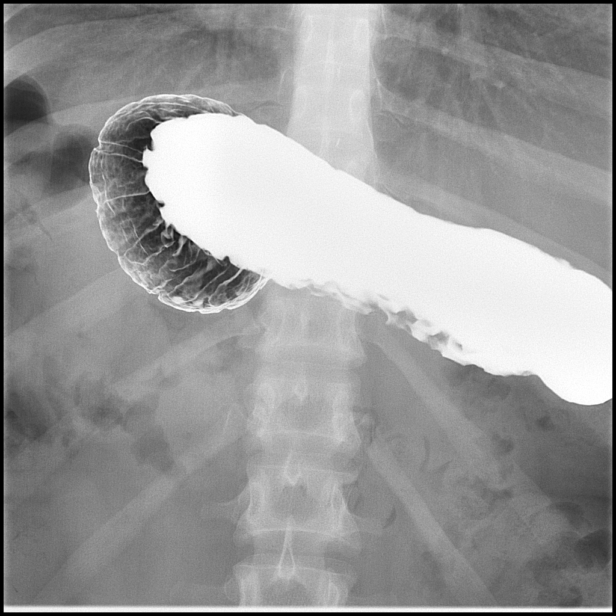

[Series 14: fluoro_barium 2fps_bw · 0.18mm/px · 1 of 1 slices shown (10 of 12)]
[im 1/1]
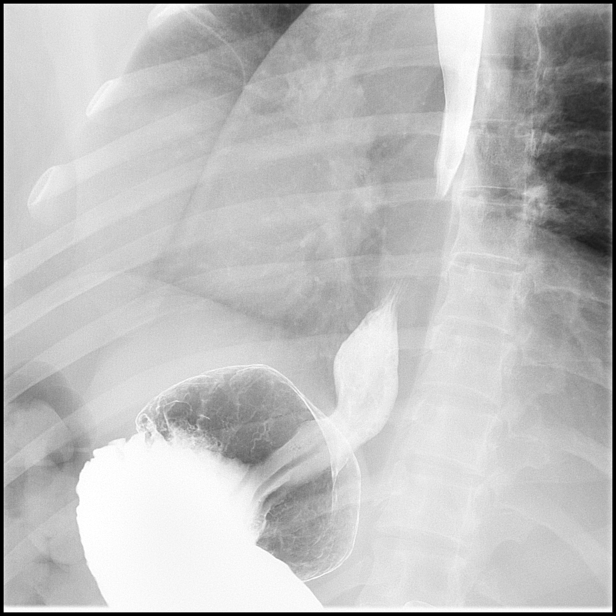

[Series 16: fluoro_barium 2fps_bw · 0.18mm/px · 1 of 1 slices shown (11 of 12)]
[im 1/1]
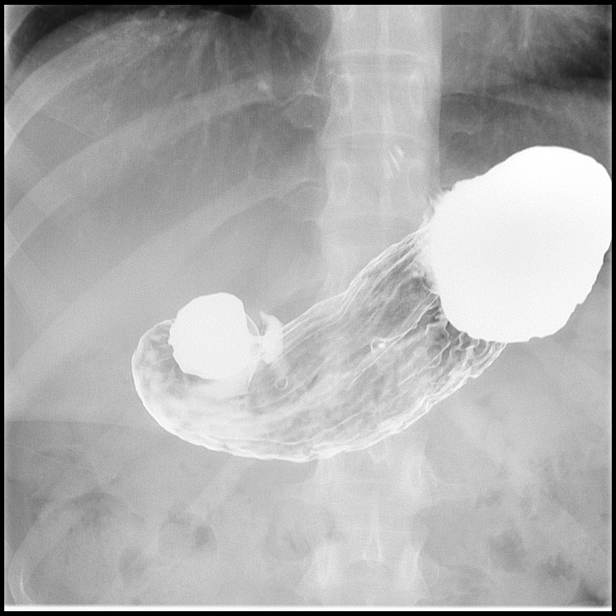

[Series 18: fluoro_barium 2fps_bw · 0.18mm/px · 1 of 1 slices shown (12 of 12)]
[im 1/1]
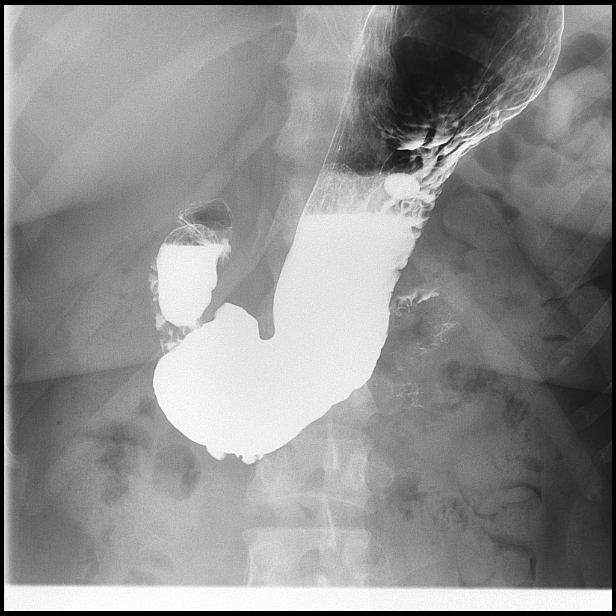

[14 of 24 positions shown; findings below may reference images not displayed]

FINDINGS: Normal oropharyngeal phase of swallow. No evidence of penetration or
aspiration.

Normal contour and caliber of the esophagus. There are intermittent
tertiary contractions. Patent lower esophageal sphincter. A
swallowed barium tablet passes readily into the stomach.

No evidence of spontaneous or provoked gastroesophageal reflux.

Normal partial double contrast appearance of the stomach and
duodenum.
IMPRESSION: Mild esophageal dysmotility. No evidence of mass or stricture. No
spontaneous or provoked gastroesophageal reflux.

## 2019-08-22 ENCOUNTER — Other Ambulatory Visit: Payer: Self-pay | Admitting: Family Medicine

## 2019-08-22 DIAGNOSIS — F419 Anxiety disorder, unspecified: Secondary | ICD-10-CM

## 2019-09-05 ENCOUNTER — Encounter: Payer: 59 | Admitting: Certified Nurse Midwife

## 2019-09-08 ENCOUNTER — Ambulatory Visit (INDEPENDENT_AMBULATORY_CARE_PROVIDER_SITE_OTHER): Payer: Managed Care, Other (non HMO) | Admitting: Certified Nurse Midwife

## 2019-09-08 ENCOUNTER — Encounter: Payer: Self-pay | Admitting: Certified Nurse Midwife

## 2019-09-08 ENCOUNTER — Other Ambulatory Visit: Payer: Self-pay

## 2019-09-08 VITALS — BP 113/80 | HR 84 | Ht 64.0 in | Wt 196.2 lb

## 2019-09-08 DIAGNOSIS — Z01419 Encounter for gynecological examination (general) (routine) without abnormal findings: Secondary | ICD-10-CM

## 2019-09-08 DIAGNOSIS — Z975 Presence of (intrauterine) contraceptive device: Secondary | ICD-10-CM

## 2019-09-08 NOTE — Progress Notes (Signed)
ANNUAL PREVENTATIVE CARE GYN  ENCOUNTER NOTE  Subjective:       Margaret Silva is a 39 y.o. G0P0000 female here for a routine annual gynecologic exam.  No current complaints or concerns.   Denies difficulty breathing or respiratory distress, chest pain, abdominal pain, excessive vaginal bleeding, dysuria, and leg pain or swelling.    Gynecologic History  No LMP recorded. (Menstrual status: IUD).  Contraception: IUD  Last Pap: 2019. Results were: Neg/Neg  Obstetric History  OB History  Gravida Para Term Preterm AB Living  0 0 0 0 0 0  SAB TAB Ectopic Multiple Live Births  0 0 0 0 0    Past Medical History:  Diagnosis Date  . Asthma   . Bursitis of left hip   . Diverticulitis    h/o  . Endometriosis   . GERD (gastroesophageal reflux disease)   . Headache   . History of chicken pox   . History of colonoscopy 11/04 and April 06  . History of laparoscopy 12/04 and 12/07  . History of laparoscopy 01/29/2006   Dr. Georga Bora Endometriosis  . Hx of migraines   . Hx: UTI (urinary tract infection)   . Interstitial cystitis 01/03/2004   hydrodistilation  . Jaundice    h/o  . Plantar fasciitis, bilateral   . PONV (postoperative nausea and vomiting)   . 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 CYSTOSCOPY WITH HYDRODISTENSION AND BIOPSY    . DIAGNOSTIC LAPAROSCOPY  02/16/2003  . ESOPHAGOGASTRODUODENOSCOPY (EGD) WITH PROPOFOL N/A 09/30/2018   Procedure: ESOPHAGOGASTRODUODENOSCOPY (EGD) WITH PROPOFOL;  Surgeon: Virgel Manifold, MD;  Location: [facility] ENDOSCOPY;  Service: Endoscopy;  Laterality: N/A;  . SIGMOIDOSCOPY  05/26/10    Current Outpatient Medications on File Prior to Visit  Medication Sig Dispense Refill  . albuterol (PROVENTIL HFA;VENTOLIN HFA) 108 (90 Base) MCG/ACT inhaler Inhale into the lungs every 6 (six) hours as needed for wheezing or shortness of breath.    Marland Kitchen  amitriptyline (ELAVIL) 50 MG tablet Take 1 tablet (50 mg total) by mouth at bedtime. 90 tablet 4  . Cholecalciferol (VITAMIN D PO) Take 5,000 Int'l Units by mouth once a week.    . diclofenac sodium (VOLTAREN) 1 % GEL Apply 4 g topically 3 (three) times daily as needed.     . divalproex (DEPAKOTE) 500 MG DR tablet Take 1 tablet (500 mg total) by mouth daily. 90 tablet 4  . DULoxetine (CYMBALTA) 30 MG capsule TAKE THREE CAPSULES DAILY 270 capsule 1  . Fluticasone Propionate, Inhal, (FLOVENT IN) Inhale into the lungs as needed.    Marland Kitchen gentamicin cream (GARAMYCIN) 0.1 % Apply 1 application topically as needed (for nose).    . Levonorgestrel (LILETTA, 52 MG, IU) by Intrauterine route.    . loratadine (CLARITIN) 10 MG tablet TAKE 1 TABLET (10 MG TOTAL) BY MOUTH DAILY. 90 tablet 3  . omeprazole (PRILOSEC) 20 MG capsule TAKE 1 CAPSULE BY MOUTH DAILY 90 capsule 3  . ondansetron (ZOFRAN-ODT) 4 MG disintegrating tablet Take 1 tablet (4 mg total) by mouth every 8 (eight) hours as needed for nausea. 30 tablet 12  . valACYclovir (VALTREX) 500 MG tablet TAKE 1 TABLET BY MOUTH DAILY 90 tablet 1  . zolpidem (AMBIEN CR) 6.25 MG CR tablet TAKE ONE TABLET AT BEDTIME AS NEEDED FORSLEEP 30 tablet 1   No current facility-administered medications on file prior to visit.  Allergies  Allergen Reactions  . Coffee Bean Extract  [Coffea Arabica] Nausea Only  . Other     Outdoor allergies   . Trazodone And Nefazodone Other (See Comments)    irritability    Social History   Socioeconomic History  . Marital status: Divorced    Spouse name: Not on file  . Number of children: 0  . Years of education: 21   . Highest education level: Not on file  Occupational History  . Occupation: [name]   Tobacco Use  . Smoking status: Never Smoker  . Smokeless tobacco: Never Used  Vaping Use  . Vaping Use: Never used  Substance and Sexual Activity  . Alcohol use: Yes    Alcohol/week: 0.0 standard drinks     Comment: occasionally  . Drug use: No  . Sexual activity: Yes    Partners: Male    Birth control/protection: I.U.D.  Other Topics Concern  . Not on file  Social History Narrative   Lives with dog   Caffeine use:  Generally when she has a headache, probably less than  weekly   Social Determinants of Health   Financial Resource Strain:   . Difficulty of Paying Living Expenses:   Food Insecurity:   . Worried About Charity fundraiser in the Last Year:   . Arboriculturist in the Last Year:   Transportation Needs:   . Film/video editor (Medical):   Marland Kitchen Lack of Transportation (Non-Medical):   Physical Activity:   . Days of Exercise per Week:   . Minutes of Exercise per Session:   Stress:   . Feeling of Stress :   Social Connections:   . Frequency of Communication with Friends and Family:   . Frequency of Social Gatherings with Friends and Family:   . Attends Religious Services:   . Active Member of Clubs or Organizations:   . Attends Archivist Meetings:   Marland Kitchen Marital Status:   Intimate Partner Violence:   . Fear of Current or Ex-Partner:   . Emotionally Abused:   Marland Kitchen Physically Abused:   . Sexually Abused:     Family History  Problem Relation Age of Onset  . Diabetes Maternal Grandmother   . Arthritis Maternal Grandmother   . Kidney disease Maternal Grandmother   . Hypertension Father   . Hyperlipidemia Father   . Fibromyalgia Mother   . Migraines Mother   . Cancer Mother 10       breast  . Breast cancer Mother   . Ehlers-Danlos syndrome Sister   . Fibromyalgia Sister   . Thyroid cancer Sister   . Heart attack Paternal Grandfather   . Cancer Paternal Grandfather        Pancreatic/ Bladder Cancer/prostate  . Arthritis Paternal Grandfather   . Heart disease Paternal Grandfather   . Cancer Paternal Grandmother        Liver cancer/lung cancer  . Diabetes Paternal Grandmother   . Heart attack Maternal Grandfather   . Heart disease Maternal Grandfather    . Hypertension Maternal Grandfather   . Mental illness Maternal Grandfather   . Ovarian cancer Neg Hx   . Colon cancer Neg Hx     The following portions of the patient's history were reviewed and updated as appropriate: allergies, current medications, past family history, past medical history, past social history, past surgical history and problem list.  Review of Systems  ROS negative except as noted above. Information obtained from  patient.   Objective:   BP 113/80   Pulse 84   Ht 5\' 4"  (1.626 m)   Wt 196 lb 3 oz (89 kg)   BMI 33.68 kg/m    CONSTITUTIONAL: Well-developed, well-nourished female in no acute distress.   PSYCHIATRIC: Normal mood and affect. Normal behavior. Normal judgment and thought content.  [place]: Alert and oriented to person, place, and time. Normal muscle tone coordination. No cranial nerve deficit noted.  HENT:  Normocephalic, atraumatic, External right and left ear normal.   EYES: Conjunctivae and EOM are normal. Pupils are equal and round.   NECK: Normal range of motion, supple, no masses.  Normal thyroid.   SKIN: Skin is warm and dry. No rash noted. Not diaphoretic. No erythema. No pallor.  CARDIOVASCULAR: Normal heart rate noted, regular rhythm, no murmur.  RESPIRATORY: Clear to auscultation bilaterally. Effort and breath sounds normal, no problems with respiration noted.  BREASTS: Symmetric in size. No masses, skin changes, nipple drainage, or lymphadenopathy.  ABDOMEN: Soft, normal bowel sounds, no distention noted.  No tenderness, rebound or guarding.   PELVIC:  External Genitalia: Normal  Vagina: Normal  Cervix: Normal, IUD string present  Uterus: Normal  Adnexa: Normal  MUSCULOSKELETAL: Normal range of motion. No tenderness.  No cyanosis, clubbing, or edema.  2+ distal pulses.  LYMPHATIC: No Axillary, Supraclavicular, or Inguinal Adenopathy.  Assessment:   Annual gynecologic examination 39 y.o.   Contraception: IUD    Obesity 1   Problem List Items Addressed This Visit    None    Visit Diagnoses    Well woman exam    -  Primary   IUD (intrauterine device) in place          Plan:   Pap: Not needed  Labs: Declined  Routine preventative health maintenance measures emphasized: Exercise/Diet/Weight control, Tobacco Warnings, Alcohol/Substance use risks and Stress Management; see AVS  Reviewed red flag symptoms and when to call  RTC x 1 year for ANNUAL EXAM or sooner if needed  Dani Gobble, CNM  [facility], [facility] 09/08/19 3:52 PM

## 2019-09-08 NOTE — Patient Instructions (Signed)
Preventive Care 21-39 Years Old, Female Preventive care refers to visits with your health care provider and lifestyle choices that can promote health and wellness. This includes:  A yearly physical exam. This may also be called an annual well check.  Regular dental visits and eye exams.  Immunizations.  Screening for certain conditions.  Healthy lifestyle choices, such as eating a healthy diet, getting regular exercise, not using drugs or products that contain nicotine and tobacco, and limiting alcohol use. What can I expect for my preventive care visit? Physical exam Your health care provider will check your:  Height and weight. This may be used to calculate body mass index (BMI), which tells if you are at a healthy weight.  Heart rate and blood pressure.  Skin for abnormal spots. Counseling Your health care provider may ask you questions about your:  Alcohol, tobacco, and drug use.  Emotional well-being.  Home and relationship well-being.  Sexual activity.  Eating habits.  Work and work environment.  Method of birth control.  Menstrual cycle.  Pregnancy history. What immunizations do I need?  Influenza (flu) vaccine  This is recommended every year. Tetanus, diphtheria, and pertussis (Tdap) vaccine  You may need a Td booster every 10 years. Varicella (chickenpox) vaccine  You may need this if you have not been vaccinated. Human papillomavirus (HPV) vaccine  If recommended by your health care provider, you may need three doses over 6 months. Measles, mumps, and rubella (MMR) vaccine  You may need at least one dose of MMR. You may also need a second dose. Meningococcal conjugate (MenACWY) vaccine  One dose is recommended if you are age 19-21 years and a first-year college student living in a residence hall, or if you have one of several medical conditions. You may also need additional booster doses. Pneumococcal conjugate (PCV13) vaccine  You may need  this if you have certain conditions and were not previously vaccinated. Pneumococcal polysaccharide (PPSV23) vaccine  You may need one or two doses if you smoke cigarettes or if you have certain conditions. Hepatitis A vaccine  You may need this if you have certain conditions or if you travel or work in places where you may be exposed to hepatitis A. Hepatitis B vaccine  You may need this if you have certain conditions or if you travel or work in places where you may be exposed to hepatitis B. Haemophilus influenzae type b (Hib) vaccine  You may need this if you have certain conditions. You may receive vaccines as individual doses or as more than one vaccine together in one shot (combination vaccines). Talk with your health care provider about the risks and benefits of combination vaccines. What tests do I need?  Blood tests  Lipid and cholesterol levels. These may be checked every 5 years starting at age 20.  Hepatitis C test.  Hepatitis B test. Screening  Diabetes screening. This is done by checking your blood sugar (glucose) after you have not eaten for a while (fasting).  Sexually transmitted disease (STD) testing.  BRCA-related cancer screening. This may be done if you have a family history of breast, ovarian, tubal, or peritoneal cancers.  Pelvic exam and Pap test. This may be done every 3 years starting at age 21. Starting at age 30, this may be done every 5 years if you have a Pap test in combination with an HPV test. Talk with your health care provider about your test results, treatment options, and if necessary, the need for more tests.   Follow these instructions at home: Eating and drinking   Eat a diet that includes fresh fruits and vegetables, whole grains, lean protein, and low-fat dairy.  Take vitamin and mineral supplements as recommended by your health care provider.  Do not drink alcohol if: ? Your health care provider tells you not to drink. ? You are  pregnant, may be pregnant, or are planning to become pregnant.  If you drink alcohol: ? Limit how much you have to 0-1 drink a day. ? Be aware of how much alcohol is in your drink. In the [place] one drink equals one 12 oz bottle of beer (355 mL), one 5 oz glass of wine (148 mL), or one 1 oz glass of hard liquor (44 mL). Lifestyle  Take daily care of your teeth and gums.  Stay active. Exercise for at least 30 minutes on 5 or more days each week.  Do not use any products that contain nicotine or tobacco, such as cigarettes, e-cigarettes, and chewing tobacco. If you need help quitting, ask your health care provider.  If you are sexually active, practice safe sex. Use a condom or other form of birth control (contraception) in order to prevent pregnancy and STIs (sexually transmitted infections). If you plan to become pregnant, see your health care provider for a preconception visit. What's next?  Visit your health care provider once a year for a well check visit.  Ask your health care provider how often you should have your eyes and teeth checked.  Stay up to date on all vaccines. This information is not intended to replace advice given to you by your health care provider. Make sure you discuss any questions you have with your health care provider. Document Revised: 10/14/2017 Document Reviewed: 10/14/2017 Elsevier Patient Education  2020 [employer].

## 2019-09-12 ENCOUNTER — Other Ambulatory Visit: Payer: Self-pay | Admitting: Family Medicine

## 2019-09-12 NOTE — Telephone Encounter (Signed)
Refill request for Margaret Silva, last seen 03-22-19, last filled 07-12-19.  Please advise.

## 2019-09-19 ENCOUNTER — Encounter: Payer: Managed Care, Other (non HMO) | Admitting: Family Medicine

## 2019-09-28 ENCOUNTER — Other Ambulatory Visit: Payer: Self-pay

## 2019-09-28 ENCOUNTER — Encounter: Payer: Self-pay | Admitting: Dermatology

## 2019-09-28 ENCOUNTER — Ambulatory Visit: Payer: Managed Care, Other (non HMO) | Admitting: Dermatology

## 2019-09-28 ENCOUNTER — Other Ambulatory Visit: Payer: Self-pay | Admitting: Dermatology

## 2019-09-28 DIAGNOSIS — L219 Seborrheic dermatitis, unspecified: Secondary | ICD-10-CM

## 2019-09-28 DIAGNOSIS — L609 Nail disorder, unspecified: Secondary | ICD-10-CM

## 2019-09-28 DIAGNOSIS — D489 Neoplasm of uncertain behavior, unspecified: Secondary | ICD-10-CM

## 2019-09-28 MED ORDER — KETOCONAZOLE 2 % EX CREA: TOPICAL_CREAM | CUTANEOUS | 3 refills | Status: DC

## 2019-09-28 NOTE — Patient Instructions (Signed)
Recommend daily broad spectrum sunscreen SPF 30+ to sun-exposed areas, reapply every 2 hours as needed. Call for new or changing lesions.  

## 2019-09-28 NOTE — Progress Notes (Signed)
   Follow-Up Visit   Subjective  Margaret Silva is a 39 y.o. female who presents for the following: Follow-up of moles  Patient presents today for follow up on OV 05/25/19 for  Mole on right breast, patient states it has not changes. Also  her B/L great toe nails, patient does have some pictures and states that they are doing ok. Patient also would like to have an area of concern on her nose evaluated today, states that I has been extremely dry.   The following portions of the chart were reviewed this encounter and updated as appropriate:  Tobacco  Allergies  Meds  Problems  Med Hx  Surg Hx  Fam Hx      Review of Systems:  No other skin or systemic complaints except as noted in HPI or Assessment and Plan.  Objective  Well appearing patient in no apparent distress; mood and affect are within normal limits.  A focused examination was performed including B/L great toe nails, face, nose and Right breast . Relevant physical exam findings are noted in the Assessment and Plan.  Objective  Left Alar Crease: Pink patches with greasy scale.   Objective  Right nasal dorsum: Small scaly macule  Objective  Right Hallux Toe Nail Plate: Right great toenail with central split with surround subunguial debris, left great toenail with similar split in photo   Assessment & Plan  Seborrheic dermatitis Left Alar Crease  Start Ketoconazole cream apply to affected area on nose.  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  Observation.  Call clinic for new or changing.  Recommend daily use of broad spectrum spf 30+ sunscreen to sun-exposed areas.    Nail disorder Right Hallux Toe Nail Plate  Unclear etiology. On bilateral great toenails making a tumor unlikely. Will send for fungal culture. Patient to send photos of the split she  showed me in clinic today and I will speak with some colleagues to see if they have other thoughts.   Other Related Procedures Fungus Culture W/Rfx Rapid ID  Return in about 8 months (around 05/28/2020) for TBSE.  I Donzetta Kohut scribed for Alfonso Patten, MD  Documentation: I have reviewed the above documentation for accuracy and completeness, and I agree with the above.  Forest Gleason, MD

## 2019-10-08 ENCOUNTER — Telehealth: Payer: Managed Care, Other (non HMO) | Admitting: Family

## 2019-10-08 DIAGNOSIS — J329 Chronic sinusitis, unspecified: Secondary | ICD-10-CM | POA: Diagnosis not present

## 2019-10-08 DIAGNOSIS — B9789 Other viral agents as the cause of diseases classified elsewhere: Secondary | ICD-10-CM | POA: Diagnosis not present

## 2019-10-08 MED ORDER — PREDNISONE 10 MG (21) PO TBPK: ORAL_TABLET | ORAL | 0 refills | Status: DC

## 2019-10-08 MED ORDER — FLUTICASONE PROPIONATE 50 MCG/ACT NA SUSP: 2.0000 | Freq: Every day | NASAL | 6 refills | Status: DC

## 2019-10-08 NOTE — Progress Notes (Signed)
We are sorry that you are not feeling well.  Here is how we plan to help!  Based on what you have shared with me it looks like you have sinusitis.  Sinusitis is inflammation and infection in the sinus cavities of the head.  Based on your presentation I believe you most likely have Acute Viral Sinusitis.This is an infection most likely caused by a virus. There is not specific treatment for viral sinusitis other than to help you with the symptoms until the infection runs its course.  You may use an oral decongestant such as Mucinex D or if you have glaucoma or high blood pressure use plain Mucinex. Saline nasal spray help and can safely be used as often as needed for congestion, I have prescribed: Fluticasone nasal spray two sprays in each nostril once a day and a prednsione dose pack. Please schedule a follow up with your ENT. You may also need to schedule a COVID test to rule out.   Some authorities believe that zinc sprays or the use of Echinacea may shorten the course of your symptoms.  Sinus infections are not as easily transmitted as other respiratory infection, however we still recommend that you avoid close contact with loved ones, especially the very young and elderly.  Remember to wash your hands thoroughly throughout the day as this is the number one way to prevent the spread of infection!  Home Care:  Only take medications as instructed by your medical team.  Do not take these medications with alcohol.  A steam or ultrasonic humidifier can help congestion.  You can place a towel over your head and breathe in the steam from hot water coming from a faucet.  Avoid close contacts especially the very young and the elderly.  Cover your mouth when you cough or sneeze.  Always remember to wash your hands.  Get Help Right Away If:  You develop worsening fever or sinus pain.  You develop a severe head ache or visual changes.  Your symptoms persist after you have completed your treatment  plan.  Make sure you  Understand these instructions.  Will watch your condition.  Will get help right away if you are not doing well or get worse.  Your e-visit answers were reviewed by a board certified advanced clinical practitioner to complete your personal care plan.  Depending on the condition, your plan could have included both over the counter or prescription medications.  If there is a problem please reply  once you have received a response from your provider.  Your safety is important to [place].  If you have drug allergies check your prescription carefully.    You can use MyChart to ask questions about today's visit, request a non-urgent call back, or ask for a work or school excuse for 24 hours related to this e-Visit. If it has been greater than 24 hours you will need to follow up with your provider, or enter a new e-Visit to address those concerns.  You will get an e-mail in the next two days asking about your experience.  I hope that your e-visit has been valuable and will speed your recovery. Thank you for using e-visits.  Approximately 5 minutes was spent documenting and reviewing patient's chart.

## 2019-10-13 ENCOUNTER — Encounter: Payer: Self-pay | Admitting: Dermatology

## 2019-10-13 LAB — FUNGUS CULTURE W/RFX RAPID ID: Fungal Culture W/Rfx: POSITIVE — AB

## 2019-10-13 LAB — FUNGAL ID BY MOLECULAR METHODS
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
## 2019-10-19 NOTE — Progress Notes (Signed)
Onychomycosis - Curvularia species   Treatment of choice is itraconazole but it's not a good choice due to medication interactions. Terbinafine would be my alternate recommendation. Called number, no answer, so will send MyChart Message to see how she would like to proceed.  If she wants to treat, will plan pulse dose terbinafine after confirming labs (CBC, CMP) are unremarkable.

## 2019-10-31 ENCOUNTER — Encounter: Payer: Self-pay | Admitting: Physician Assistant

## 2019-10-31 ENCOUNTER — Encounter: Payer: Self-pay | Admitting: Dermatology

## 2019-10-31 ENCOUNTER — Telehealth: Payer: Self-pay | Admitting: Dermatology

## 2019-10-31 ENCOUNTER — Other Ambulatory Visit: Payer: Self-pay

## 2019-10-31 ENCOUNTER — Ambulatory Visit (INDEPENDENT_AMBULATORY_CARE_PROVIDER_SITE_OTHER): Payer: Managed Care, Other (non HMO) | Admitting: Physician Assistant

## 2019-10-31 VITALS — BP 119/79 | HR 88 | Resp 14 | Ht 64.0 in | Wt 195.0 lb

## 2019-10-31 DIAGNOSIS — G2581 Restless legs syndrome: Secondary | ICD-10-CM

## 2019-10-31 DIAGNOSIS — I498 Other specified cardiac arrhythmias: Secondary | ICD-10-CM

## 2019-10-31 DIAGNOSIS — R5383 Other fatigue: Secondary | ICD-10-CM

## 2019-10-31 DIAGNOSIS — M62838 Other muscle spasm: Secondary | ICD-10-CM

## 2019-10-31 DIAGNOSIS — M797 Fibromyalgia: Secondary | ICD-10-CM

## 2019-10-31 DIAGNOSIS — Z8659 Personal history of other mental and behavioral disorders: Secondary | ICD-10-CM

## 2019-10-31 DIAGNOSIS — F5104 Psychophysiologic insomnia: Secondary | ICD-10-CM | POA: Diagnosis not present

## 2019-10-31 DIAGNOSIS — M7062 Trochanteric bursitis, left hip: Secondary | ICD-10-CM

## 2019-10-31 DIAGNOSIS — Z8669 Personal history of other diseases of the nervous system and sense organs: Secondary | ICD-10-CM

## 2019-10-31 DIAGNOSIS — M170 Bilateral primary osteoarthritis of knee: Secondary | ICD-10-CM | POA: Diagnosis not present

## 2019-10-31 DIAGNOSIS — K588 Other irritable bowel syndrome: Secondary | ICD-10-CM

## 2019-10-31 DIAGNOSIS — Z87440 Personal history of urinary (tract) infections: Secondary | ICD-10-CM

## 2019-10-31 DIAGNOSIS — B351 Tinea unguium: Secondary | ICD-10-CM

## 2019-10-31 DIAGNOSIS — M722 Plantar fascial fibromatosis: Secondary | ICD-10-CM

## 2019-10-31 DIAGNOSIS — G90A Postural orthostatic tachycardia syndrome (POTS): Secondary | ICD-10-CM

## 2019-10-31 MED ORDER — TRIAMCINOLONE ACETONIDE 40 MG/ML IJ SUSP
10.0000 mg | INTRAMUSCULAR | Status: AC | PRN
  Administered 2019-10-31: 10 mg via INTRAMUSCULAR

## 2019-10-31 MED ORDER — LIDOCAINE HCL 1 % IJ SOLN
0.5000 mL | INTRAMUSCULAR | Status: AC | PRN
  Administered 2019-10-31: .5 mL

## 2019-10-31 NOTE — Telephone Encounter (Signed)
Called patient to discuss culture results (see result note) since she has not responded to [employer]. No answer. Left voicemail.

## 2019-10-31 NOTE — Patient Instructions (Signed)
Hip Bursitis Rehab Ask your health care provider which exercises are safe for you. Do exercises exactly as told by your health care provider and adjust them as directed. It is normal to feel mild stretching, pulling, tightness, or discomfort as you do these exercises. Stop right away if you feel sudden pain or your pain gets worse. Do not begin these exercises until told by your health care provider. Stretching exercise This exercise warms up your muscles and joints and improves the movement and flexibility of your hip. This exercise also helps to relieve pain and stiffness. Iliotibial band stretch An iliotibial band is a strong band of muscle tissue that runs from the outer side of your hip to the outer side of your thigh and knee. 1. Lie on your side with your left / right leg in the top position. 2. Bend your left / right knee and grab your ankle. Stretch out your bottom arm to help you balance. 3. Slowly bring your knee back so your thigh is behind your body. 4. Slowly lower your knee toward the floor until you feel a gentle stretch on the outside of your left / right thigh. If you do not feel a stretch and your knee will not fall farther, place the heel of your other foot on top of your knee and pull your knee down toward the floor with your foot. 5. Hold this position for __________ seconds. 6. Slowly return to the starting position. Repeat __________ times. Complete this exercise __________ times a day. Strengthening exercises These exercises build strength and endurance in your hip and pelvis. Endurance is the ability to use your muscles for a long time, even after they get tired. Bridge This exercise strengthens the muscles that move your thigh backward (hip extensors). 1. Lie on your back on a firm surface with your knees bent and your feet flat on the floor. 2. Tighten your buttocks muscles and lift your buttocks off the floor until your trunk is level with your thighs. ? Do not arch  your back. ? You should feel the muscles working in your buttocks and the back of your thighs. If you do not feel these muscles, slide your feet 1-2 inches (2.5-5 cm) farther away from your buttocks. ? If this exercise is too easy, try doing it with your arms crossed over your chest. 3. Hold this position for __________ seconds. 4. Slowly lower your hips to the starting position. 5. Let your muscles relax completely after each repetition. Repeat __________ times. Complete this exercise __________ times a day. Squats This exercise strengthens the muscles in front of your thigh and knee (quadriceps). 1. Stand in front of a table, with your feet and knees pointing straight ahead. You may rest your hands on the table for balance but not for support. 2. Slowly bend your knees and lower your hips like you are going to sit in a chair. ? Keep your weight over your heels, not over your toes. ? Keep your lower legs upright so they are parallel with the table legs. ? Do not let your hips go lower than your knees. ? Do not bend lower than told by your health care provider. ? If your hip pain increases, do not bend as low. 3. Hold the squat position for __________ seconds. 4. Slowly push with your legs to return to standing. Do not use your hands to pull yourself to standing. Repeat __________ times. Complete this exercise __________ times a day. Hip hike 1. Stand   sideways on a bottom step. Stand on your left / right leg with your other foot unsupported next to the step. You can hold on to the railing or wall for balance if needed. 2. Keep your knees straight and your torso square. Then lift your left / right hip up toward the ceiling. 3. Hold this position for __________ seconds. 4. Slowly let your left / right hip lower toward the floor, past the starting position. Your foot should get closer to the floor. Do not lean or bend your knees. Repeat __________ times. Complete this exercise __________ times a  day. Single leg stand 1. Without shoes, stand near a railing or in a doorway. You may hold on to the railing or door frame as needed for balance. 2. Squeeze your left / right buttock muscles, then lift up your other foot. ? Do not let your left / right hip push out to the side. ? It is helpful to stand in front of a mirror for this exercise so you can watch your hip. 3. Hold this position for __________ seconds. Repeat __________ times. Complete this exercise __________ times a day. This information is not intended to replace advice given to you by your health care provider. Make sure you discuss any questions you have with your health care provider. Document Revised: 05/30/2018 Document Reviewed: 05/30/2018 Elsevier Patient Education  2020 [employer]  

## 2019-11-01 NOTE — Telephone Encounter (Signed)
Orders placed and left message on patients phone to return my call. I will send MyChart message now.

## 2019-11-11 LAB — COMPREHENSIVE METABOLIC PANEL
ALT: 12 IU/L (ref 0–32)
AST: 9 IU/L (ref 0–40)
Albumin/Globulin Ratio: 1.8 (ref 1.2–2.2)
Albumin: 4.2 g/dL (ref 3.8–4.8)
Alkaline Phosphatase: 89 IU/L (ref 44–121)
BUN/Creatinine Ratio: 14 (ref 9–23)
BUN: 16 mg/dL (ref 6–20)
Bilirubin Total: 0.3 mg/dL (ref 0.0–1.2)
CO2: 24 mmol/L (ref 20–29)
Calcium: 9.7 mg/dL (ref 8.7–10.2)
Chloride: 103 mmol/L (ref 96–106)
Creatinine, Ser: 1.13 mg/dL — ABNORMAL HIGH (ref 0.57–1.00)
GFR calc Af Amer: 71 mL/min/{1.73_m2} (ref >59)
GFR calc non Af Amer: 62 mL/min/{1.73_m2} (ref >59)
Globulin, Total: 2.4 g/dL (ref 1.5–4.5)
Glucose: 90 mg/dL (ref 65–99)
Potassium: 5.4 mmol/L — ABNORMAL HIGH (ref 3.5–5.2)
Sodium: 139 mmol/L (ref 134–144)
Total Protein: 6.6 g/dL (ref 6.0–8.5)

## 2019-11-11 LAB — CBC WITH DIFFERENTIAL/PLATELET
Basophils Absolute: 0 10*3/uL (ref 0.0–0.2)
Basos: 0 %
EOS (ABSOLUTE): 0 10*3/uL (ref 0.0–0.4)
Eos: 1 %
Hematocrit: 42.4 % (ref 34.0–46.6)
Hemoglobin: 14.3 g/dL (ref 11.1–15.9)
Immature Grans (Abs): 0 10*3/uL (ref 0.0–0.1)
Immature Granulocytes: 1 %
Lymphocytes Absolute: 2.3 10*3/uL (ref 0.7–3.1)
Lymphs: 30 %
MCH: 31.6 pg (ref 26.6–33.0)
MCHC: 33.7 g/dL (ref 31.5–35.7)
MCV: 94 fL (ref 79–97)
Monocytes Absolute: 0.4 10*3/uL (ref 0.1–0.9)
Monocytes: 6 %
Neutrophils Absolute: 4.7 10*3/uL (ref 1.4–7.0)
Neutrophils: 62 %
Platelets: 416 10*3/uL (ref 150–450)
RBC: 4.53 x10E6/uL (ref 3.77–5.28)
RDW: 13.6 % (ref 11.7–15.4)
WBC: 7.5 10*3/uL (ref 3.4–10.8)

## 2019-11-14 NOTE — Telephone Encounter (Signed)
Results showed slightly elevated creatinine (kidney test) and potassium. Will include Dr. Caryl Bis on this note to let him know. Would suggest repeat CMP and possible urinalysis but will defer to Dr. Caryl Bis.   However, these do not prevent her from being treated with pulse dose terbinafine for her onychomycosis.   MAs please call and notify patient.   Please send in terbinafine 250 mg 28 tablets. This should be taken daily x 7 days (beginning of month 1) and repeated for 7 days at the beginning of months 3, 5, and 7.  Please advise it can take several months for the toenails to normalize and that typically the improvement is seen starting from the proximal nail fold (closest part of the toenail). I would recommend follow-up in 6 months.

## 2019-11-16 ENCOUNTER — Telehealth: Payer: Self-pay

## 2019-11-16 DIAGNOSIS — B351 Tinea unguium: Secondary | ICD-10-CM

## 2019-11-16 MED ORDER — TERBINAFINE HCL 250 MG PO TABS: 250.0000 mg | ORAL_TABLET | Freq: Every day | ORAL | 0 refills | Status: DC

## 2019-11-16 NOTE — Telephone Encounter (Signed)
Patient advised lab results, terbinafine 250mg  sent in 1 daily x 7 days first week of month 1, 3, 5 and 7, JS

## 2019-11-17 ENCOUNTER — Telehealth: Payer: Self-pay | Admitting: Family Medicine

## 2019-11-17 ENCOUNTER — Other Ambulatory Visit: Payer: Self-pay | Admitting: Family Medicine

## 2019-11-17 DIAGNOSIS — N179 Acute kidney failure, unspecified: Secondary | ICD-10-CM

## 2019-11-17 NOTE — Telephone Encounter (Signed)
I called and spoke with the patient and she stated all she drinks is water.  Patient stated she takes aleve occasionally. Patient does not mind doing labs next week but she wants it at [facility].  Sheena Donegan,cma

## 2019-11-17 NOTE — Telephone Encounter (Signed)
Message received from dermatology regarding mildly elevated creatinine and slightly elevated potassium.  I would suggest we recheck this next week.  Order placed.  Please see how much water she is drinking.  Please see if she is taking any anti-inflammatory such as ibuprofen or Aleve.

## 2019-11-17 NOTE — Telephone Encounter (Signed)
-----   Message from Alfonso Patten, MD sent at 11/14/2019  9:46 PM EDT ----- Results showed slightly elevated creatinine (kidney test) and potassium. Will include Dr. Caryl Bis on this note to let him know. Would suggest repeat CMP and possible urinalysis but will defer to Dr. Caryl Bis.   However, these do not prevent her from being treated with pulse dose terbinafine for her onychomycosis.   MAs please call and notify patient.   Please send in terbinafine 250 mg 28 tablets. This should be taken daily x 7 days (beginning of month 1) and repeated for 7 days at the beginning of months 3, 5, and 7.  Please advise it can take several months for the toenails to normalize and that typically the improvement is seen starting from the proximal nail fold (closest part of the toenail). I would recommend follow-up in 6 months.

## 2019-11-20 NOTE — Telephone Encounter (Signed)
This patient has not been seen since February. She needs to be seen for this to be filled. Please call and get her scheduled.

## 2019-11-20 NOTE — Addendum Note (Signed)
Addended by: Leone Haven on: 11/20/2019 10:52 AM   Modules accepted: Orders

## 2019-11-20 NOTE — Telephone Encounter (Addendum)
Noted.  Labs ordered to be done at [facility].  She should stop taking Aleve.  She should not take any ibuprofen either.

## 2019-11-23 NOTE — Telephone Encounter (Signed)
Please follow-up with the patient to make sure she goes to have labs done this week.  Thanks.

## 2019-11-27 ENCOUNTER — Other Ambulatory Visit: Payer: Self-pay

## 2019-11-27 ENCOUNTER — Encounter: Payer: Self-pay | Admitting: Neurology

## 2019-11-27 ENCOUNTER — Ambulatory Visit: Payer: Managed Care, Other (non HMO) | Admitting: Neurology

## 2019-11-27 DIAGNOSIS — G43009 Migraine without aura, not intractable, without status migrainosus: Secondary | ICD-10-CM

## 2019-11-27 MED ORDER — AMITRIPTYLINE HCL 50 MG PO TABS
50.0000 mg | ORAL_TABLET | Freq: Every day | ORAL | 4 refills | Status: DC
Start: 2019-11-27 — End: 2020-11-28

## 2019-11-27 MED ORDER — ONDANSETRON 4 MG PO TBDP: 4.0000 mg | ORAL_TABLET | Freq: Three times a day (TID) | ORAL | 12 refills | Status: DC | PRN

## 2019-11-27 MED ORDER — DIVALPROEX SODIUM 500 MG PO DR TAB: 500.0000 mg | DELAYED_RELEASE_TABLET | Freq: Every day | ORAL | 4 refills | Status: DC

## 2019-11-27 NOTE — Progress Notes (Signed)
[facility]    Provider:  Dr Margaret Silva Referring Provider: Leone Haven, MD Primary Care Physician:  Margaret Haven, MD  CC:  Headaches and migraines  Interval history 11/27/2019:  She is doing well, he is working from home, she moved into her new home in January this year. When she has the migraines she starts with excedrin and then she goes to the [place].   Interval history 11/28/2018: She is here for follow up of migranies. Migraines have been better. She has switched jobs. She is building a house. Switched jobs, she is at [place] are better. Only one a month and cambia helps.    Interval history: Tried botox and had reactions(difficulty swallowing, shortness of breath, sore throat, pulse in the 100's , cough), tried Depakote in the past (she cannot get pregnant), She has used onzetra in the past and worked well and cambia works well.  She has a new job. She is still on Depakote. Migraines are still ongoing. She would like a screen protector note for her computer bc it is causing her migraines. Her migraines are improved. 4 migraine days a month treated with excedrin and sometimes cambia. Continue the Cambia and try to get the [place]. Refill medications.   HPI:  Margaret Silva is a 39 y.o. female here as a referral from Dr. Caryl Silva for headaches. Past medical history migraines, fibromyalgia, interstitial cystitis, diverticulitis, endometriosis, ADHD, dyslexia, restless legs, bursitis, plantar fasciitis, IBS. Migraines Since a teenager. She is on Cymbalta which helps. She works around a lot of dyes which have exacerbated her migraines. Migraines are on the right side. It is sharp, pounding, going up the neck on the back, sometimes the other side. Lots of sound and light sensitivity. Lots of nausea. Occurs several times a week, lasts all day. She has headaches every day, 30 headache days a month, At least 8 are bad migraine days. Has tried TCA,  verapamil,propanolol. She is in the lab now which may be better as less triggers. Cymbalta was just increased. She takes Tylenol 3-4x a week only, no medication overuse . Shutting herslef off in a dark room helps. No aura. Computer overuse makes it worse. She takes nothing for the nausea. Sumatriptan used to work not anymore. Zomig nasal and the cambia helped. Sometimes the migraines are immediate but usually a more gradual increase in severity. The low-level headaches are usually as she go to work every day. The headaches are at the base of the skull, all day long, 1-2 level, She does not take the robaxin. No other focal neurologic deficits or complaints.  Currently: cymbalta, imitrex, on Depakote, topamax.  Has tried verapamil, propranolol, amitriptyline in the past. She has also tried naproxen, ibuprofen, indomethacin.  Reviewed notes, labs and imaging from outside physicians, which showed: Reviewed notes from [facility] orthopedics rheumatology. Patient has fibromyalgia syndrome. Fibromyalgia has been active. Her generalized pain scale of 0-10 has been about 6-8 in her fatigue has been about 7. She has plantar fasciitis in her left foot going for a few months now. She had cortisone injections, podiatrist. She also had physical therapy which she has not done yet that she has been doing some splinting at night also some exercises without much result. She went on a mission trip to [place] and since then has been having pain in her left hip and bilateral knee joints without joint swelling. She has a history of morning stiffness, discomfort walking and doing routine activities. She is 16 out  of 18 points positive on her fibromyalgia. Her fibromyalgia is active with generalized pain and positive tender points. She has mild osteoarthritis. She is overweight. Weight loss, diet, exercise and muscle strengthening exercises were discussed. IT band exercises and muscle strengthening exercises were discussed. She was  referred to physical therapy for plantar fasciitis and left trochanteric bursitis. She also has insomnia, history of IBS, bilateral trapezius muscle spasms.  Reviewed image reports. X-rays of left hip joint 2 views, were within normal limits. X-rays of bilateral knee joints, 2 views, showed mild medial compartment narrowing and some lateral osteophytes with mild patellofemoral narrowing without any chondrocalcinosis.  Reviewed lab reports, CMP was normal with normal creatinine 0.94. This was taken on January 2016.  Cholesterol high T10, LDL elevated at 133, TSH within normal limits, CBC normal.  Review of Systems: Patient complains of symptoms per HPI as well as the following symptoms: Fatigue, chest pain, palpitations, blurred vision, shortness of breath, wheezing, snoring, feeling cold, joint pain, aching muscles, allergies, frequent infection, headache, numbness, weakness, dizziness, insomnia, sleepiness, restless legs. Pertinent negatives per HPI. All others negative.   Social History   Socioeconomic History  . Marital status: Divorced    Spouse name: Not on file  . Number of children: 0  . Years of education: 52   . Highest education level: Not on file  Occupational History  . Occupation: Margaret Silva   Tobacco Use  . Smoking status: Never Smoker  . Smokeless tobacco: Never Used  Vaping Use  . Vaping Use: Never used  Substance and Sexual Activity  . Alcohol use: Yes    Alcohol/week: 0.0 standard drinks    Comment: occasionally  . Drug use: No  . Sexual activity: Yes    Partners: Male    Birth control/protection: I.U.D.  Other Topics Concern  . Not on file  Social History Narrative   Lives with dog   Caffeine use:  Generally when she has a headache, probably less than  Weekly   Right handed   Social Determinants of Health   Financial Resource Strain:   . Difficulty of Paying Living Expenses: Not on file  Food Insecurity:   . Worried About Sales executive in the Last Year: Not on file  . Ran Out of Food in the Last Year: Not on file  Transportation Needs:   . Lack of Transportation (Medical): Not on file  . Lack of Transportation (Non-Medical): Not on file  Physical Activity:   . Days of Exercise per Week: Not on file  . Minutes of Exercise per Session: Not on file  Stress:   . Feeling of Stress : Not on file  Social Connections:   . Frequency of Communication with Friends and Family: Not on file  . Frequency of Social Gatherings with Friends and Family: Not on file  . Attends Religious Services: Not on file  . Active Member of Clubs or Organizations: Not on file  . Attends Archivist Meetings: Not on file  . Marital Status: Not on file  Intimate Partner Violence:   . Fear of Current or Ex-Partner: Not on file  . Emotionally Abused: Not on file  . Physically Abused: Not on file  . Sexually Abused: Not on file    Family History  Problem Relation Age of Onset  . Diabetes Maternal Grandmother   . Arthritis Maternal Grandmother   . Kidney disease Maternal Grandmother   . Hypertension Father   . Hyperlipidemia Father   .  Fibromyalgia Mother   . Migraines Mother   . Cancer Mother 12       breast  . Breast cancer Mother   . Ehlers-Danlos syndrome Sister   . Fibromyalgia Sister   . Thyroid cancer Sister   . Heart attack Paternal Grandfather   . Cancer Paternal Grandfather        Pancreatic/ Bladder Cancer/prostate  . Arthritis Paternal Grandfather   . Heart disease Paternal Grandfather   . Cancer Paternal Grandmother        Liver cancer/lung cancer  . Diabetes Paternal Grandmother   . Heart attack Maternal Grandfather   . Heart disease Maternal Grandfather   . Hypertension Maternal Grandfather   . Mental illness Maternal Grandfather   . Ovarian cancer Neg Hx   . Colon cancer Neg Hx     Past Medical History:  Diagnosis Date  . Asthma   . Bursitis of left hip   . Diverticulitis    h/o  . Endometriosis    . GERD (gastroesophageal reflux disease)   . Headache   . History of chicken pox   . History of colonoscopy 11/04 and April 06  . History of laparoscopy 12/04 and 12/07  . History of laparoscopy 01/29/2006   Dr. Georga Bora Endometriosis  . Hx of migraines   . Hx: UTI (urinary tract infection)   . Interstitial cystitis 01/03/2004   hydrodistilation  . Jaundice    h/o  . Plantar fasciitis, bilateral   . PONV (postoperative nausea and vomiting)   . 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 CYSTOSCOPY WITH HYDRODISTENSION AND BIOPSY    . DIAGNOSTIC LAPAROSCOPY  02/16/2003  . ESOPHAGOGASTRODUODENOSCOPY (EGD) WITH PROPOFOL N/A 09/30/2018   Procedure: ESOPHAGOGASTRODUODENOSCOPY (EGD) WITH PROPOFOL;  Surgeon: Virgel Manifold, MD;  Location: [facility] ENDOSCOPY;  Service: Endoscopy;  Laterality: N/A;  . SIGMOIDOSCOPY  05/26/10    Current Outpatient Medications  Medication Sig Dispense Refill  . albuterol (PROVENTIL HFA;VENTOLIN HFA) 108 (90 Base) MCG/ACT inhaler Inhale into the lungs every 6 (six) hours as needed for wheezing or shortness of breath.    Marland Kitchen amitriptyline (ELAVIL) 50 MG tablet Take 1 tablet (50 mg total) by mouth at bedtime. 90 tablet 4  . Cholecalciferol (VITAMIN D PO) Take 5,000 Int'l Units by mouth once a week.    . diclofenac sodium (VOLTAREN) 1 % GEL Apply 4 g topically 3 (three) times daily as needed.     . divalproex (DEPAKOTE) 500 MG DR tablet Take 1 tablet (500 mg total) by mouth daily. 90 tablet 4  . DULoxetine (CYMBALTA) 30 MG capsule TAKE THREE CAPSULES DAILY 270 capsule 1  . fluticasone (FLONASE) 50 MCG/ACT nasal spray Place 2 sprays into both nostrils daily. 16 g 6  . Fluticasone Propionate, Inhal, (FLOVENT IN) Inhale into the lungs as needed.    Marland Kitchen gentamicin cream (GARAMYCIN) 0.1 % Apply 1 application topically as needed (for nose).    Marland Kitchen ketoconazole (NIZORAL) 2 % cream Apply  to crease of nose twice daily as needed for flake and itch. 60 g 3  . loratadine (CLARITIN) 10 MG tablet TAKE 1 TABLET (10 MG TOTAL) BY MOUTH DAILY. 90 tablet 3  . omeprazole (PRILOSEC) 20 MG capsule TAKE 1 CAPSULE BY MOUTH DAILY 90 capsule 3  . ondansetron (ZOFRAN-ODT) 4 MG disintegrating tablet Take 1 tablet (4 mg total) by mouth every 8 (eight) hours as needed  for nausea. 30 tablet 12  . terbinafine (LAMISIL) 250 MG tablet Take 1 tablet (250 mg total) by mouth daily. Patient advised to take 1 by mouth daily the first week of months 1, 3, 5 and 7 28 tablet 0  . valACYclovir (VALTREX) 500 MG tablet TAKE 1 TABLET BY MOUTH DAILY 90 tablet 1  . zolpidem (AMBIEN CR) 6.25 MG CR tablet TAKE ONE TABLET AT BEDTIME AS NEEDED FORSLEEP 30 tablet 0   No current facility-administered medications for this visit.    Allergies as of 11/27/2019 - Review Complete 11/27/2019  Allergen Reaction Noted  . Coffee bean extract  [coffea arabica] Nausea Only 09/04/2014  . Other  08/12/2015  . Trazodone and nefazodone Other (See Comments) 08/03/2013    Vitals: BP 122/79 (BP Location: Right Arm, Patient Position: Sitting)   Pulse 92   Ht 5\' 4"  (1.626 m)   Wt 195 lb (88.5 kg)   BMI 33.47 kg/m  Last Weight:  Wt Readings from Last 1 Encounters:  11/27/19 195 lb (88.5 kg)   Last Height:   Ht Readings from Last 1 Encounters:  11/27/19 5\' 4"  (1.626 m)   Physical exam: Exam: Gen: NAD, conversant, well nourised, well groomed                     CV: RRR, no MRG. No Carotid Bruits. No peripheral edema, warm, nontender Eyes: Conjunctivae clear without exudates or hemorrhage  Neuro: Detailed Neurologic Exam  Speech:    Speech is normal; fluent and spontaneous with normal comprehension.  Cognition:    The patient is oriented to person, place, and time;     recent and remote memory intact;     language fluent;     normal attention, concentration,     fund of knowledge Cranial Nerves:    The pupils are  equal, round, and reactive to light.  Visual fields are full to finger confrontation. Extraocular movements are intact. Trigeminal sensation is intact and the muscles of mastication are normal. The face is symmetric. The palate elevates in the midline. Hearing intact. Voice is normal. Shoulder shrug is normal. The tongue has normal motion without fasciculations.    Motor Observation:    No asymmetry, no atrophy, and no involuntary movements noted. Tone:    Normal muscle tone.    Posture:    Posture is normal. normal erect    Strength:    Strength is V/V in the upper and lower limbs.        Assessment/Plan:  39 year old with chronic migraines, non-intractable, without status migrainosus, without aura.  Continue current medications  Acute management migraines: cambia, zofran (refill) Preventative: Depakote, Amitriptyline at night, may help with migraines as well as fibromyalgia and insomnia.  Meds ordered this encounter  Medications  . divalproex (DEPAKOTE) 500 MG DR tablet    Sig: Take 1 tablet (500 mg total) by mouth daily.    Dispense:  90 tablet    Refill:  4  . amitriptyline (ELAVIL) 50 MG tablet    Sig: Take 1 tablet (50 mg total) by mouth at bedtime.    Dispense:  90 tablet    Refill:  4  . ondansetron (ZOFRAN-ODT) 4 MG disintegrating tablet    Sig: Take 1 tablet (4 mg total) by mouth every 8 (eight) hours as needed for nausea.    Dispense:  30 tablet    Refill:  12    To prevent or relieve headaches, try the following: Cool Compress.  Lie down and place a cool compress on your head.  Avoid headache triggers. If certain foods or odors seem to have triggered your migraines in the past, avoid them. A headache diary might help you identify triggers.  Include physical activity in your daily routine. Try a daily walk or other moderate aerobic exercise.  Manage stress. Find healthy ways to cope with the stressors, such as delegating tasks on your to-do list.  Practice  relaxation techniques. Try deep breathing, yoga, massage and visualization.  Eat regularly. Eating regularly scheduled meals and maintaining a healthy diet might help prevent headaches. Also, drink plenty of fluids.  Follow a regular sleep schedule. Sleep deprivation might contribute to headaches Consider biofeedback. With this mind-body technique, you learn to control certain bodily functions -- such as muscle tension, heart rate and blood pressure -- to prevent headaches or reduce headache pain.    Proceed to emergency room if you experience new or worsening symptoms or symptoms do not resolve, if you have new neurologic symptoms or if headache is severe, or for any concerning symptom.   Cc: Dr. Irine Seal, MD  [facility] [address] [place], [place] [address]  Phone [phone] Fax [phone] I spent over 10 minutes of face-to-face and non-face-to-face time with patient on the  1. Migraine without aura and without status migrainosus, not intractable    diagnosis.  This included previsit chart review, lab review, study review, order entry, electronic health record documentation, patient education on the different diagnostic and therapeutic options, counseling and coordination of care, risks and benefits of management, compliance, or risk factor reduction

## 2019-11-27 NOTE — Telephone Encounter (Signed)
Called patient and labs were done today at [facility]

## 2019-11-28 ENCOUNTER — Telehealth: Payer: Self-pay | Admitting: Neurology

## 2019-11-28 LAB — BASIC METABOLIC PANEL
BUN/Creatinine Ratio: 15 (ref 9–23)
BUN: 15 mg/dL (ref 6–20)
CO2: 23 mmol/L (ref 20–29)
Calcium: 9.7 mg/dL (ref 8.7–10.2)
Chloride: 103 mmol/L (ref 96–106)
Creatinine, Ser: 0.98 mg/dL (ref 0.57–1.00)
GFR calc Af Amer: 85 mL/min/{1.73_m2} (ref >59)
GFR calc non Af Amer: 73 mL/min/{1.73_m2} (ref >59)
Glucose: 98 mg/dL (ref 65–99)
Potassium: 4.8 mmol/L (ref 3.5–5.2)
Sodium: 139 mmol/L (ref 134–144)

## 2019-11-28 NOTE — Telephone Encounter (Signed)
We received a determination from [facility] stating the PA has been cancelled because the medication is on the plan's covered list of drugs. PA is not required at this time. Reference # PA- [id number]. I spoke with Ebony Hail @ [facility] who checked the pt's file and stated her insurance requires filling at [facility] or via mail order pharmacy. Ebony Hail stated she would contact the pt to get this handled. No further action needed by our office at this time.

## 2019-11-28 NOTE — Telephone Encounter (Signed)
PA completed through cover my meds/ [facility] KEY: [id number] Will wait for response.

## 2019-11-30 NOTE — Telephone Encounter (Signed)
Please hold for Dr. Caryl Bis.

## 2019-12-04 ENCOUNTER — Other Ambulatory Visit: Payer: Self-pay | Admitting: Family Medicine

## 2019-12-04 DIAGNOSIS — B009 Herpesviral infection, unspecified: Secondary | ICD-10-CM

## 2019-12-04 NOTE — Telephone Encounter (Signed)
Patient called in for refill for zolpidem (AMBIEN CR) 6.25 MG CR tablet she has a appt next month.

## 2019-12-06 ENCOUNTER — Telehealth: Payer: Self-pay | Admitting: Family Medicine

## 2019-12-06 NOTE — Telephone Encounter (Signed)
The wellness appeal form was placed in the sign basket for you to sign.  Deadline is 10/22/202.  Venice Marcucci,cma

## 2019-12-06 NOTE — Telephone Encounter (Signed)
Patient dropped off a [employer] insurance form to be completed by Dr. Caryl Bis. Form is up front in his color folder.

## 2019-12-07 NOTE — Telephone Encounter (Signed)
Form signed. She will need to keep her visit in November for [place] to discuss her weight and weight loss program. There is a portion of the form the patient still needs to sign so she needs to pick this up today.

## 2019-12-07 NOTE — Telephone Encounter (Signed)
I called and spoke with the patient and informed her that her wellness form was done and is at the front desk and she stated she would pick the form up today on her lunch break.  Garreth Burnsworth,cma

## 2019-12-26 ENCOUNTER — Other Ambulatory Visit: Payer: Self-pay

## 2019-12-26 ENCOUNTER — Other Ambulatory Visit: Payer: Self-pay | Admitting: Family Medicine

## 2019-12-27 ENCOUNTER — Encounter: Payer: Self-pay | Admitting: Family Medicine

## 2019-12-27 ENCOUNTER — Ambulatory Visit (INDEPENDENT_AMBULATORY_CARE_PROVIDER_SITE_OTHER): Payer: Managed Care, Other (non HMO) | Admitting: Family Medicine

## 2019-12-27 VITALS — BP 110/70 | HR 96 | Temp 98.1°F | Ht 64.0 in | Wt 194.0 lb

## 2019-12-27 DIAGNOSIS — F5104 Psychophysiologic insomnia: Secondary | ICD-10-CM

## 2019-12-27 DIAGNOSIS — K219 Gastro-esophageal reflux disease without esophagitis: Secondary | ICD-10-CM | POA: Diagnosis not present

## 2019-12-27 DIAGNOSIS — E669 Obesity, unspecified: Secondary | ICD-10-CM | POA: Diagnosis not present

## 2019-12-27 NOTE — Patient Instructions (Signed)
Nice to see you. We will get labs today. I refilled your Ambien. Please continue to work on diet and exercise.

## 2019-12-27 NOTE — Assessment & Plan Note (Signed)
Chronic issue.  Stable.  She can continue Ambien CR 6.25 mg once daily.

## 2019-12-27 NOTE — Assessment & Plan Note (Signed)
Well-controlled.  Continue omeprazole 20 mg daily. 

## 2019-12-27 NOTE — Progress Notes (Signed)
  Tommi Rumps, MD Phone: [phone]  Margaret Silva is a 39 y.o. female who presents today for follow-up.  Insomnia: Chronic issue.  She notes this is stable.  Ambien helps her fall asleep and stay asleep.  She typically gets about 8 hours of sleep.  No drowsiness with this.  No alcohol intake with the Ambien.  GERD: Taking omeprazole.  No reflux, abdominal pain, blood in her stool, or dysphagia.  She notes her dysphagia has improved with the omeprazole.  Prior EGD with benign findings.  She has deferred manometry to work this up due to cost.  She reports no family history of gastric or esophageal cancer.  Obesity: Patient's weight has trended down.  She has been eating smaller portions and working out 5 days a week.  Social History   Tobacco Use  Smoking Status Never Smoker  Smokeless Tobacco Never Used     ROS see history of present illness  Objective  Physical Exam Vitals:   12/27/19 1529  BP: 110/70  Pulse: 96  Temp: 98.1 F (36.7 C)  SpO2: 99%    BP Readings from Last 3 Encounters:  12/27/19 110/70  11/27/19 122/79  10/31/19 119/79   Wt Readings from Last 3 Encounters:  12/27/19 194 lb (88 kg)  11/27/19 195 lb (88.5 kg)  10/31/19 195 lb (88.5 kg)    Physical Exam Constitutional:      General: She is not in acute distress.    Appearance: She is not diaphoretic.  Cardiovascular:     Rate and Rhythm: Normal rate and regular rhythm.     Heart sounds: Normal heart sounds.  Pulmonary:     Effort: Pulmonary effort is normal.     Breath sounds: Normal breath sounds.  Skin:    General: Skin is warm and dry.  Neurological:     Mental Status: She is alert.      Assessment/Plan: Please see individual problem list.  Problem List Items Addressed This Visit    Chronic insomnia    Chronic issue.  Stable.  She can continue Ambien CR 6.25 mg once daily.      GERD (gastroesophageal reflux disease)    Well-controlled.  Continue omeprazole 20 mg  daily.      Obesity (BMI 30.0-34.9) - Primary    Patient's weight has been trending down with diet and exercise.  She will continue diet and exercise.  Screening labs as outlined below.      Relevant Orders   Lipid panel   HgB A1c       Health Maintenance: I encouraged her to get the flu vaccine and Covid vaccine when she is ready to do so.  She is hesitant to do so at this time in case she gets sick as she cannot miss any additional work.   This visit occurred during the SARS-CoV-2 public health emergency.  Safety protocols were in place, including screening questions prior to the visit, additional usage of staff PPE, and extensive cleaning of exam room while observing appropriate contact time as indicated for disinfecting solutions.    Tommi Rumps, MD [place]

## 2019-12-27 NOTE — Assessment & Plan Note (Addendum)
Patient's weight has been trending down with diet and exercise.  She will continue diet and exercise.  Screening labs as outlined below.

## 2019-12-28 LAB — LIPID PANEL
Chol/HDL Ratio: 4.6 ratio — ABNORMAL HIGH (ref 0.0–4.4)
Cholesterol, Total: 229 mg/dL — ABNORMAL HIGH (ref 100–199)
HDL: 50 mg/dL (ref >39)
LDL Chol Calc (NIH): 165 mg/dL — ABNORMAL HIGH (ref 0–99)
Triglycerides: 80 mg/dL (ref 0–149)
VLDL Cholesterol Cal: 14 mg/dL (ref 5–40)

## 2019-12-28 LAB — HEMOGLOBIN A1C
Est. average glucose Bld gHb Est-mCnc: 117 mg/dL
Hgb A1c MFr Bld: 5.7 % — ABNORMAL HIGH (ref 4.8–5.6)

## 2020-02-01 ENCOUNTER — Telehealth: Payer: Self-pay

## 2020-02-02 NOTE — Telephone Encounter (Signed)
I do not see flovent on her medication list. Please contact the patient to clarify this. She has flonase on her list. It that what she needs?

## 2020-02-02 NOTE — Telephone Encounter (Signed)
Lvm for the patient to call back.  Katrine Radich,cma  

## 2020-02-08 NOTE — Telephone Encounter (Signed)
I called the patient and she stated she needed Flovent inhaler and the ventolin inhaler sent to her pharmacy.  Whyatt Klinger,cma

## 2020-02-10 NOTE — Telephone Encounter (Signed)
It does not look like we have prescribed these previously. Can you find out what she uses them for and see who prescribed them previously? Thanks.

## 2020-02-19 NOTE — Telephone Encounter (Signed)
Called and spoke with the Patient. States she had the pharmacy send this to the original MD and this will no longer be needed on our end.

## 2020-02-21 ENCOUNTER — Telehealth: Payer: Self-pay | Admitting: Family Medicine

## 2020-02-21 ENCOUNTER — Telehealth: Payer: Managed Care, Other (non HMO) | Admitting: Nurse Practitioner

## 2020-02-21 DIAGNOSIS — B9789 Other viral agents as the cause of diseases classified elsewhere: Secondary | ICD-10-CM | POA: Diagnosis not present

## 2020-02-21 DIAGNOSIS — J329 Chronic sinusitis, unspecified: Secondary | ICD-10-CM | POA: Diagnosis not present

## 2020-02-21 MED ORDER — PREDNISONE 20 MG PO TABS: ORAL_TABLET | ORAL | 0 refills | Status: DC

## 2020-02-21 MED ORDER — FLUTICASONE PROPIONATE 50 MCG/ACT NA SUSP: 2.0000 | Freq: Every day | NASAL | 6 refills | Status: AC

## 2020-02-21 MED ORDER — BENZONATATE 100 MG PO CAPS: 100.0000 mg | ORAL_CAPSULE | Freq: Three times a day (TID) | ORAL | 0 refills | Status: DC | PRN

## 2020-02-21 NOTE — Addendum Note (Signed)
Addended by: Bennie Pierini on: 02/21/2020 12:23 PM   Modules accepted: Orders

## 2020-02-21 NOTE — Progress Notes (Signed)
We are sorry that you are not feeling well.  Here is how we plan to help!  Based on what you have shared with me it looks like you have sinusitis.  Sinusitis is inflammation and infection in the sinus cavities of the head.  Based on your presentation I believe you most likely have Acute Viral Sinusitis.This is an infection most likely caused by a virus. There is not specific treatment for viral sinusitis other than to help you with the symptoms until the infection runs its course.  You may use an oral decongestant such as Mucinex D or if you have glaucoma or high blood pressure use plain Mucinex. Saline nasal spray help and can safely be used as often as needed for congestion, I have prescribed: Fluticasone nasal spray two sprays in each nostril once a day  You do not meet the criteria for antibiotics at this time. Providers prescribe antibiotics to treat infections caused by bacteria. Antibiotics are very powerful in treating bacterial infections when they are used properly. To maintain their effectiveness, they should be used only when necessary. Overuse of antibiotics has resulted in the development of superbugs that are resistant to treatment!    After careful review of your answers, I would not recommend an antibiotic for your condition.  Antibiotics are not effective against viruses and therefore should not be used to treat them. Common examples of infections caused by viruses include colds and flu   Some authorities believe that zinc sprays or the use of Echinacea may shorten the course of your symptoms.  Sinus infections are not as easily transmitted as other respiratory infection, however we still recommend that you avoid close contact with loved ones, especially the very young and elderly.  Remember to wash your hands thoroughly throughout the day as this is the number one way to prevent the spread of infection!  Home Care:  Only take medications as instructed by your medical team.  Do  not take these medications with alcohol.  A steam or ultrasonic humidifier can help congestion.  You can place a towel over your head and breathe in the steam from hot water coming from a faucet.  Avoid close contacts especially the very young and the elderly.  Cover your mouth when you cough or sneeze.  Always remember to wash your hands.  Get Help Right Away If:  You develop worsening fever or sinus pain.  You develop a severe head ache or visual changes.  Your symptoms persist after you have completed your treatment plan.  Make sure you  Understand these instructions.  Will watch your condition.  Will get help right away if you are not doing well or get worse.  Your e-visit answers were reviewed by a board certified advanced clinical practitioner to complete your personal care plan.  Depending on the condition, your plan could have included both over the counter or prescription medications.  If there is a problem please reply  once you have received a response from your provider.  Your safety is important to [place].  If you have drug allergies check your prescription carefully.    You can use MyChart to ask questions about today's visit, request a non-urgent call back, or ask for a work or school excuse for 24 hours related to this e-Visit. If it has been greater than 24 hours you will need to follow up with your provider, or enter a new e-Visit to address those concerns.  You will get an e-mail in the  next two days asking about your experience.  I hope that your e-visit has been valuable and will speed your recovery. Thank you for using e-visits.  5-10 minutes spent reviewing and documenting in chart.  

## 2020-02-21 NOTE — Telephone Encounter (Signed)
Patient called in stated that she had a sinus infection need some antibiotisc put her on schedule for a virtual appointment  03-01-20

## 2020-02-27 ENCOUNTER — Other Ambulatory Visit: Payer: Self-pay | Admitting: Family Medicine

## 2020-02-27 NOTE — Telephone Encounter (Signed)
Refill request for Ambien CR 6.25 mg tabs.  LOV - 12/27/19 Next OV - 03/01/20 Last refilled - 01/04/20

## 2020-03-01 ENCOUNTER — Telehealth (INDEPENDENT_AMBULATORY_CARE_PROVIDER_SITE_OTHER): Payer: Managed Care, Other (non HMO) | Admitting: Family Medicine

## 2020-03-01 ENCOUNTER — Encounter: Payer: Self-pay | Admitting: Family Medicine

## 2020-03-01 ENCOUNTER — Other Ambulatory Visit: Payer: Managed Care, Other (non HMO)

## 2020-03-01 ENCOUNTER — Other Ambulatory Visit: Payer: Self-pay

## 2020-03-01 DIAGNOSIS — J069 Acute upper respiratory infection, unspecified: Secondary | ICD-10-CM | POA: Diagnosis not present

## 2020-03-01 MED ORDER — AMOXICILLIN-POT CLAVULANATE 875-125 MG PO TABS: 1.0000 | ORAL_TABLET | Freq: Two times a day (BID) | ORAL | 0 refills | Status: DC

## 2020-03-01 NOTE — Assessment & Plan Note (Signed)
Differential includes viral upper respiratory infection, COVID-19, and bacterial sinus infection.  At this point she certainly has a sinus infection based on duration.  We will have her tested for COVID-19 later today.  I gave her strict quarantine precautions and she will remain quarantined until we contact her with her results.  We will proceed with treatment with Augmentin for sinus infection.  Advised to go through the drive-through to pick this up and wear her mask.  Discussed reasons to seek medical attention including chest pain, shortness of breath, and fevers of 103 F or higher.

## 2020-03-01 NOTE — Progress Notes (Signed)
Virtual Visit via telephone Note  This visit type was conducted due to national recommendations for restrictions regarding the COVID-19 pandemic (e.g. social distancing).  This format is felt to be most appropriate for this patient at this time.  All issues noted in this document were discussed and addressed.  No physical exam was performed (except for noted visual exam findings with Video Visits).   I connected with Carolyne Fiscal today at  9:30 AM EST by telephone and verified that I am speaking with the correct person using two identifiers. Location patient: home Location provider: work  Persons participating in the virtual visit: patient, provider  I discussed the limitations, risks, security and privacy concerns of performing an evaluation and management service by telephone and the availability of in person appointments. I also discussed with the patient that there may be a patient responsible charge related to this service. The patient expressed understanding and agreed to proceed.  Interactive audio and video telecommunications were attempted between this provider and patient, however failed, due to patient having technical difficulties OR patient did not have access to video capability.  We continued and completed visit with audio only.   Reason for visit: same day  HPI: Respiratory illness: onset on 1/3. Feels like it is getting worse with her sinus congestion  Cough- yes  Congestion- yes   Sinus- yes, just started to blow white/green mucus out   Chest- yes  Post nasal drip- yes  Sore throat- yes  Shortness of breath- no  Fever-no  Taste disturbance- yes  Smell disturbance- yes  Covid exposure- no  Covid vaccination- no  Covid booster- no  Medications- prednisone from an e-visit (no benefit), cough drops, dimatap    ROS: See pertinent positives and negatives per HPI.  Past Medical History:  Diagnosis Date  . Asthma   . Bursitis of left hip   . Diverticulitis     h/o  . Endometriosis   . GERD (gastroesophageal reflux disease)   . Headache   . History of chicken pox   . History of colonoscopy 11/04 and April 06  . History of laparoscopy 12/04 and 12/07  . History of laparoscopy 01/29/2006   Dr. Georga Bora Endometriosis  . Hx of migraines   . Hx: UTI (urinary tract infection)   . Interstitial cystitis 01/03/2004   hydrodistilation  . Jaundice    h/o  . Plantar fasciitis, bilateral   . PONV (postoperative nausea and vomiting)   . 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 CYSTOSCOPY WITH HYDRODISTENSION AND BIOPSY    . DIAGNOSTIC LAPAROSCOPY  02/16/2003  . ESOPHAGOGASTRODUODENOSCOPY (EGD) WITH PROPOFOL N/A 09/30/2018   Procedure: ESOPHAGOGASTRODUODENOSCOPY (EGD) WITH PROPOFOL;  Surgeon: Virgel Manifold, MD;  Location: [facility] ENDOSCOPY;  Service: Endoscopy;  Laterality: N/A;  . SIGMOIDOSCOPY  05/26/10    Family History  Problem Relation Age of Onset  . Diabetes Maternal Grandmother   . Arthritis Maternal Grandmother   . Kidney disease Maternal Grandmother   . Hypertension Father   . Hyperlipidemia Father   . Fibromyalgia Mother   . Migraines Mother   . Cancer Mother 36       breast  . Breast cancer Mother   . Ehlers-Danlos syndrome Sister   . Fibromyalgia Sister   . Thyroid cancer Sister   . Heart attack Paternal Grandfather   . Cancer Paternal Grandfather  Pancreatic/ Bladder Cancer/prostate  . Arthritis Paternal Grandfather   . Heart disease Paternal Grandfather   . Cancer Paternal Grandmother        Liver cancer/lung cancer  . Diabetes Paternal Grandmother   . Heart attack Maternal Grandfather   . Heart disease Maternal Grandfather   . Hypertension Maternal Grandfather   . Mental illness Maternal Grandfather   . Ovarian cancer Neg Hx   . Colon cancer Neg Hx     SOCIAL HX: nonsmoker   Current Outpatient Medications:  .   albuterol (PROVENTIL HFA;VENTOLIN HFA) 108 (90 Base) MCG/ACT inhaler, Inhale into the lungs every 6 (six) hours as needed for wheezing or shortness of breath., Disp: , Rfl:  .  amitriptyline (ELAVIL) 50 MG tablet, Take 1 tablet (50 mg total) by mouth at bedtime., Disp: 90 tablet, Rfl: 4 .  amoxicillin-clavulanate (AUGMENTIN) 875-125 MG tablet, Take 1 tablet by mouth 2 (two) times daily., Disp: 14 tablet, Rfl: 0 .  benzonatate (TESSALON PERLES) 100 MG capsule, Take 1 capsule (100 mg total) by mouth 3 (three) times daily as needed., Disp: 20 capsule, Rfl: 0 .  Cholecalciferol (VITAMIN D PO), Take 5,000 Int'l Units by mouth once a week., Disp: , Rfl:  .  diclofenac sodium (VOLTAREN) 1 % GEL, Apply 4 g topically 3 (three) times daily as needed. , Disp: , Rfl:  .  divalproex (DEPAKOTE) 500 MG DR tablet, Take 1 tablet (500 mg total) by mouth daily., Disp: 90 tablet, Rfl: 4 .  DULoxetine (CYMBALTA) 30 MG capsule, TAKE THREE CAPSULES DAILY, Disp: 270 capsule, Rfl: 1 .  fluticasone (FLONASE) 50 MCG/ACT nasal spray, Place 2 sprays into both nostrils daily., Disp: 16 g, Rfl: 6 .  Fluticasone Propionate, Inhal, (FLOVENT IN), Inhale into the lungs as needed., Disp: , Rfl:  .  gentamicin cream (GARAMYCIN) 0.1 %, Apply 1 application topically as needed (for nose)., Disp: , Rfl:  .  ketoconazole (NIZORAL) 2 % cream, Apply to crease of nose twice daily as needed for flake and itch., Disp: 60 g, Rfl: 3 .  loratadine (CLARITIN) 10 MG tablet, TAKE 1 TABLET (10 MG TOTAL) BY MOUTH DAILY., Disp: 90 tablet, Rfl: 3 .  omeprazole (PRILOSEC) 20 MG capsule, TAKE 1 CAPSULE BY MOUTH DAILY, Disp: 90 capsule, Rfl: 3 .  ondansetron (ZOFRAN-ODT) 4 MG disintegrating tablet, Take 1 tablet (4 mg total) by mouth every 8 (eight) hours as needed for nausea., Disp: 30 tablet, Rfl: 12 .  predniSONE (DELTASONE) 20 MG tablet, 2 po at sametime daily for 5 days, Disp: 10 tablet, Rfl: 0 .  terbinafine (LAMISIL) 250 MG tablet, Take 1 tablet (250  mg total) by mouth daily. Patient advised to take 1 by mouth daily the first week of months 1, 3, 5 and 7, Disp: 28 tablet, Rfl: 0 .  valACYclovir (VALTREX) 500 MG tablet, TAKE 1 TABLET BY MOUTH DAILY, Disp: 90 tablet, Rfl: 1 .  zolpidem (AMBIEN CR) 6.25 MG CR tablet, TAKE ONE TABLET AT BEDTIME IF NEEDED FORSLEEP, Disp: 30 tablet, Rfl: 0  EXAM: This was a telephone visit and thus no physical exam was completed.  ASSESSMENT AND PLAN:  Discussed the following assessment and plan:  Problem List Items Addressed This Visit    Upper respiratory infection    Differential includes viral upper respiratory infection, COVID-19, and bacterial sinus infection.  At this point she certainly has a sinus infection based on duration.  We will have her tested for COVID-19 later today.  I gave her  strict quarantine precautions and she will remain quarantined until we contact her with her results.  We will proceed with treatment with Augmentin for sinus infection.  Advised to go through the drive-through to pick this up and wear her mask.  Discussed reasons to seek medical attention including chest pain, shortness of breath, and fevers of 103 F or higher.      Relevant Orders   Novel Coronavirus, NAA ([facility])       I discussed the assessment and treatment plan with the patient. The patient was provided an opportunity to ask questions and all were answered. The patient agreed with the plan and demonstrated an understanding of the instructions.   The patient was advised to call back or seek an in-person evaluation if the symptoms worsen or if the condition fails to improve as anticipated.  I provided 6 minutes of non-face-to-face time during this encounter.   Tommi Rumps, MD

## 2020-03-03 LAB — NOVEL CORONAVIRUS, NAA: SARS-CoV-2, NAA: NOT DETECTED

## 2020-03-03 LAB — SARS-COV-2, NAA 2 DAY TAT

## 2020-03-14 ENCOUNTER — Telehealth: Payer: Self-pay | Admitting: Family Medicine

## 2020-03-14 NOTE — Telephone Encounter (Signed)
Patient called in wanted to let Dr.Sonnenberg know that she does not have any taste and possible UTI

## 2020-03-15 ENCOUNTER — Telehealth: Payer: Managed Care, Other (non HMO) | Admitting: Nurse Practitioner

## 2020-03-15 DIAGNOSIS — N3000 Acute cystitis without hematuria: Secondary | ICD-10-CM

## 2020-03-15 MED ORDER — CEPHALEXIN 500 MG PO CAPS: 500.0000 mg | ORAL_CAPSULE | Freq: Two times a day (BID) | ORAL | 0 refills | Status: DC

## 2020-03-15 NOTE — Progress Notes (Signed)

## 2020-03-19 NOTE — Telephone Encounter (Signed)
Patient saw another provider for this.  Sidnee Gambrill,cma

## 2020-03-27 ENCOUNTER — Other Ambulatory Visit: Payer: Self-pay | Admitting: Family Medicine

## 2020-03-27 ENCOUNTER — Other Ambulatory Visit: Payer: Self-pay

## 2020-03-27 DIAGNOSIS — F419 Anxiety disorder, unspecified: Secondary | ICD-10-CM

## 2020-03-29 ENCOUNTER — Ambulatory Visit: Payer: Managed Care, Other (non HMO) | Admitting: Family Medicine

## 2020-03-29 ENCOUNTER — Encounter: Payer: Self-pay | Admitting: Family Medicine

## 2020-03-29 ENCOUNTER — Other Ambulatory Visit: Payer: Self-pay

## 2020-03-29 DIAGNOSIS — F419 Anxiety disorder, unspecified: Secondary | ICD-10-CM

## 2020-03-29 DIAGNOSIS — E669 Obesity, unspecified: Secondary | ICD-10-CM | POA: Diagnosis not present

## 2020-03-29 DIAGNOSIS — F5104 Psychophysiologic insomnia: Secondary | ICD-10-CM | POA: Diagnosis not present

## 2020-03-29 DIAGNOSIS — F3340 Major depressive disorder, recurrent, in remission, unspecified: Secondary | ICD-10-CM

## 2020-03-29 MED ORDER — ZOLPIDEM TARTRATE ER 6.25 MG PO TBCR: EXTENDED_RELEASE_TABLET | ORAL | 2 refills | Status: DC

## 2020-03-29 NOTE — Assessment & Plan Note (Signed)
Stable.  Continue Ambien. 

## 2020-03-29 NOTE — Assessment & Plan Note (Signed)
She will continue to work on diet and exercise.

## 2020-03-29 NOTE — Patient Instructions (Signed)
Nice to see you. Please consider getting the COVID-19 vaccine. Please continue with diet and exercise.

## 2020-03-29 NOTE — Assessment & Plan Note (Signed)
Stable.  Continue Cymbalta. 

## 2020-03-29 NOTE — Progress Notes (Signed)
Margaret Rumps, MD Phone: [phone]  Margaret Silva is a 40 y.o. female who presents today for f/u.  Insomnia: Taking Ambien nightly.  Gets 8-1/2 hours of sleep.  Notes this is working well for her.  No drowsiness.  Rare alcohol intake and does not drink any alcohol with the Ambien.  Anxiety/depression.:  Patient notes this is stable.  The Cymbalta has been beneficial.  No SI.  Obesity: Patient is down about 25 pounds over the last year.  Her goal is to lose another 50 pounds.  She got into circuit training recently and is working on portion control.  Sinus infection: Patient her symptoms improved significantly after antibiotic treatment.  She notes the taste and smell is slow to come back.  She notes she typically has taste and smell issues after sinus infections.  Social History   Tobacco Use  Smoking Status Never Smoker  Smokeless Tobacco Never Used    Current Outpatient Medications on File Prior to Visit  Medication Sig Dispense Refill  . albuterol (PROVENTIL HFA;VENTOLIN HFA) 108 (90 Base) MCG/ACT inhaler Inhale into the lungs every 6 (six) hours as needed for wheezing or shortness of breath.    Marland Kitchen amitriptyline (ELAVIL) 50 MG tablet Take 1 tablet (50 mg total) by mouth at bedtime. 90 tablet 4  . amoxicillin-clavulanate (AUGMENTIN) 875-125 MG tablet Take 1 tablet by mouth 2 (two) times daily. 14 tablet 0  . benzonatate (TESSALON PERLES) 100 MG capsule Take 1 capsule (100 mg total) by mouth 3 (three) times daily as needed. 20 capsule 0  . cephALEXin (KEFLEX) 500 MG capsule Take 1 capsule (500 mg total) by mouth 2 (two) times daily. 14 capsule 0  . Cholecalciferol (VITAMIN D PO) Take 5,000 Int'l Units by mouth once a week.    . diclofenac sodium (VOLTAREN) 1 % GEL Apply 4 g topically 3 (three) times daily as needed.     . divalproex (DEPAKOTE) 500 MG DR tablet Take 1 tablet (500 mg total) by mouth daily. 90 tablet 4  . DULoxetine (CYMBALTA) 30 MG capsule TAKE THREE  CAPSULES DAILY 270 capsule 1  . fluticasone (FLONASE) 50 MCG/ACT nasal spray Place 2 sprays into both nostrils daily. 16 g 6  . Fluticasone Propionate, Inhal, (FLOVENT IN) Inhale into the lungs as needed.    Marland Kitchen gentamicin cream (GARAMYCIN) 0.1 % Apply 1 application topically as needed (for nose).    Marland Kitchen ketoconazole (NIZORAL) 2 % cream Apply to crease of nose twice daily as needed for flake and itch. 60 g 3  . loratadine (CLARITIN) 10 MG tablet TAKE 1 TABLET (10 MG TOTAL) BY MOUTH DAILY. 90 tablet 3  . omeprazole (PRILOSEC) 20 MG capsule TAKE 1 CAPSULE BY MOUTH DAILY 90 capsule 3  . ondansetron (ZOFRAN-ODT) 4 MG disintegrating tablet Take 1 tablet (4 mg total) by mouth every 8 (eight) hours as needed for nausea. 30 tablet 12  . predniSONE (DELTASONE) 20 MG tablet 2 po at sametime daily for 5 days 10 tablet 0  . terbinafine (LAMISIL) 250 MG tablet Take 1 tablet (250 mg total) by mouth daily. Patient advised to take 1 by mouth daily the first week of months 1, 3, 5 and 7 28 tablet 0  . valACYclovir (VALTREX) 500 MG tablet TAKE 1 TABLET BY MOUTH DAILY 90 tablet 1   No current facility-administered medications on file prior to visit.     ROS see history of present illness  Objective  Physical Exam Vitals:   03/29/20 1544  BP: 120/70  Pulse: 92  Temp: 98.2 F (36.8 C)  SpO2: 99%    BP Readings from Last 3 Encounters:  03/29/20 120/70  12/27/19 110/70  11/27/19 122/79   Wt Readings from Last 3 Encounters:  03/29/20 197 lb 12.8 oz (89.7 kg)  03/01/20 194 lb (88 kg)  12/27/19 194 lb (88 kg)    Physical Exam Constitutional:      General: She is not in acute distress.    Appearance: She is not diaphoretic.  Cardiovascular:     Rate and Rhythm: Normal rate and regular rhythm.     Heart sounds: Normal heart sounds.  Pulmonary:     Effort: Pulmonary effort is normal.     Breath sounds: Normal breath sounds.  Musculoskeletal:        General: No edema.  Skin:    General: Skin is  warm and dry.  Neurological:     Mental Status: She is alert.      Assessment/Plan: Please see individual problem list.  Problem List Items Addressed This Visit    Anxiety    Stable.  Continue Cymbalta.      Chronic insomnia    Stable.  Continue Ambien.      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  Continue Cymbalta.      Obesity (BMI 30.0-34.9)    She will continue to work on diet and exercise.        Prior sinus infection has resolved.  Taste and smell may be slow to return.  Health maintenance: I encouraged her to get the COVID-19 vaccine though she declines at this time.  She will try to figure out when her last tetanus vaccine was.  She declines flu vaccine.  This visit occurred during the SARS-CoV-2 public health emergency.  Safety protocols were in place, including screening questions prior to the visit, additional usage of staff PPE, and extensive cleaning of exam room while observing appropriate contact time as indicated for disinfecting solutions.    Margaret Rumps, MD [place]
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
## 2020-04-30 ENCOUNTER — Other Ambulatory Visit: Payer: Self-pay

## 2020-04-30 ENCOUNTER — Ambulatory Visit: Payer: Managed Care, Other (non HMO) | Admitting: Physician Assistant

## 2020-04-30 ENCOUNTER — Encounter: Payer: Self-pay | Admitting: Physician Assistant

## 2020-04-30 VITALS — BP 126/83 | HR 84 | Resp 15 | Ht 64.0 in | Wt 198.0 lb

## 2020-04-30 DIAGNOSIS — G90A Postural orthostatic tachycardia syndrome (POTS): Secondary | ICD-10-CM

## 2020-04-30 DIAGNOSIS — R5383 Other fatigue: Secondary | ICD-10-CM | POA: Diagnosis not present

## 2020-04-30 DIAGNOSIS — Z8669 Personal history of other diseases of the nervous system and sense organs: Secondary | ICD-10-CM

## 2020-04-30 DIAGNOSIS — Z87440 Personal history of urinary (tract) infections: Secondary | ICD-10-CM

## 2020-04-30 DIAGNOSIS — M62838 Other muscle spasm: Secondary | ICD-10-CM | POA: Diagnosis not present

## 2020-04-30 DIAGNOSIS — Z8659 Personal history of other mental and behavioral disorders: Secondary | ICD-10-CM

## 2020-04-30 DIAGNOSIS — F5104 Psychophysiologic insomnia: Secondary | ICD-10-CM

## 2020-04-30 DIAGNOSIS — M797 Fibromyalgia: Secondary | ICD-10-CM

## 2020-04-30 DIAGNOSIS — M170 Bilateral primary osteoarthritis of knee: Secondary | ICD-10-CM

## 2020-04-30 DIAGNOSIS — M722 Plantar fascial fibromatosis: Secondary | ICD-10-CM

## 2020-04-30 DIAGNOSIS — K588 Other irritable bowel syndrome: Secondary | ICD-10-CM

## 2020-04-30 DIAGNOSIS — M7062 Trochanteric bursitis, left hip: Secondary | ICD-10-CM | POA: Diagnosis not present

## 2020-04-30 DIAGNOSIS — I498 Other specified cardiac arrhythmias: Secondary | ICD-10-CM

## 2020-04-30 DIAGNOSIS — G2581 Restless legs syndrome: Secondary | ICD-10-CM

## 2020-04-30 MED ORDER — TRIAMCINOLONE ACETONIDE 40 MG/ML IJ SUSP
40.0000 mg | INTRAMUSCULAR | Status: AC | PRN
  Administered 2020-04-30: 40 mg via INTRA_ARTICULAR

## 2020-04-30 MED ORDER — LIDOCAINE HCL 1 % IJ SOLN
1.5000 mL | INTRAMUSCULAR | Status: AC | PRN
  Administered 2020-04-30: 1.5 mL

## 2020-04-30 MED ORDER — TRIAMCINOLONE ACETONIDE 40 MG/ML IJ SUSP
10.0000 mg | INTRAMUSCULAR | Status: AC | PRN
  Administered 2020-04-30: 10 mg via INTRAMUSCULAR

## 2020-04-30 MED ORDER — LIDOCAINE HCL 1 % IJ SOLN
0.5000 mL | INTRAMUSCULAR | Status: AC | PRN
  Administered 2020-04-30: .5 mL

## 2020-05-23 ENCOUNTER — Other Ambulatory Visit: Payer: Self-pay

## 2020-05-23 ENCOUNTER — Ambulatory Visit: Payer: Managed Care, Other (non HMO) | Admitting: Dermatology

## 2020-05-23 DIAGNOSIS — D1800 Hemangioma unspecified site: Secondary | ICD-10-CM

## 2020-05-23 DIAGNOSIS — L814 Other melanin hyperpigmentation: Secondary | ICD-10-CM | POA: Diagnosis not present

## 2020-05-23 DIAGNOSIS — L578 Other skin changes due to chronic exposure to nonionizing radiation: Secondary | ICD-10-CM | POA: Diagnosis not present

## 2020-05-23 DIAGNOSIS — Z1283 Encounter for screening for malignant neoplasm of skin: Secondary | ICD-10-CM | POA: Diagnosis not present

## 2020-05-23 DIAGNOSIS — D229 Melanocytic nevi, unspecified: Secondary | ICD-10-CM

## 2020-05-23 DIAGNOSIS — D2271 Melanocytic nevi of right lower limb, including hip: Secondary | ICD-10-CM | POA: Diagnosis not present

## 2020-05-23 DIAGNOSIS — L821 Other seborrheic keratosis: Secondary | ICD-10-CM

## 2020-05-23 NOTE — Patient Instructions (Addendum)
Melanoma ABCDEs  Melanoma is the most dangerous type of skin cancer, and is the leading cause of death from skin disease.  You are more likely to develop melanoma if you: Have light-colored skin, light-colored eyes, or red or blond hair Spend a lot of time in the sun Tan regularly, either outdoors or in a tanning bed Have had blistering sunburns, especially during childhood Have a close family member who has had a melanoma Have atypical moles or large birthmarks  Early detection of melanoma is key since treatment is typically straightforward and cure rates are extremely high if we catch it early.   The first sign of melanoma is often a change in a mole or a new dark spot.  The ABCDE system is a way of remembering the signs of melanoma.  A for asymmetry:  The two halves do not match. B for border:  The edges of the growth are irregular. C for color:  A mixture of colors are present instead of an even brown color. D for diameter:  Melanomas are usually (but not always) greater than 6mm - the size of a pencil eraser. E for evolution:  The spot keeps changing in size, shape, and color.  Please check your skin once per month between visits. You can use a small mirror in front and a large mirror behind you to keep an eye on the back side or your body.   If you see any new or changing lesions before your next follow-up, please call to schedule a visit.  Please continue daily skin protection including broad spectrum sunscreen SPF 30+ to sun-exposed areas, reapplying every 2 hours as needed when you're outdoors.   Staying in the shade or wearing long sleeves, sun glasses (UVA+UVB protection) and wide brim hats (4-inch brim around the entire circumference of the hat) are also recommended for sun protection.    Recommend taking Heliocare sun protection supplement daily in sunny weather for additional sun protection. For maximum protection on the sunniest days, you can take up to 2 capsules of  regular Heliocare OR take 1 capsule of Heliocare Ultra. For prolonged exposure (such as a full day in the sun), you can repeat your dose of the supplement 4 hours after your first dose. Heliocare can be purchased at [facility] or at [email or web address].com.    If you have any questions or concerns for your doctor, please call our main line at [phone] and press option 4 to reach your doctor's medical assistant. If no one answers, please leave a voicemail as directed and we will return your call as soon as possible. Messages left after 4 pm will be answered the following business day.   You may also send us a message via MyChart. We typically respond to MyChart messages within 1-2 business days.  For prescription refills, please ask your pharmacy to contact our office. Our fax number is [phone].  If you have an urgent issue when the clinic is closed that cannot wait until the next business day, you can page your doctor at the number below.    Please note that while we do our best to be available for urgent issues outside of office hours, we are not available 24/7.   If you have an urgent issue and are unable to reach us, you may choose to seek medical care at your doctor's office, retail clinic, urgent care center, or emergency room.  If you have a medical emergency, please immediately call 911 or go to   the emergency department.  Pager Numbers  - Dr. Kowalski: [phone]  - Dr. Moye: [phone]  - Dr. Stewart: [phone]  In the event of inclement weather, please call our main line at [phone] for an update on the status of any delays or closures.  Dermatology Medication Tips: Please keep the boxes that topical medications come in in order to help keep track of the instructions about where and how to use these. Pharmacies typically print the medication instructions only on the boxes and not directly on the medication tubes.   If your medication is too expensive,  please contact our office at [phone] option 4 or send us a message through MyChart.   We are unable to tell what your co-pay for medications will be in advance as this is different depending on your insurance coverage. However, we may be able to find a substitute medication at lower cost or fill out paperwork to get insurance to cover a needed medication.   If a prior authorization is required to get your medication covered by your insurance company, please allow us 1-2 business days to complete this process.  Drug prices often vary depending on where the prescription is filled and some pharmacies may offer cheaper prices.  The website www.goodrx.com contains coupons for medications through different pharmacies. The prices here do not account for what the cost may be with help from insurance (it may be cheaper with your insurance), but the website can give you the price if you did not use any insurance.  - You can print the associated coupon and take it with your prescription to the pharmacy.  - You may also stop by our office during regular business hours and pick up a GoodRx coupon card.  - If you need your prescription sent electronically to a different pharmacy, notify our office through [facility] MyChart or by phone at [phone] option 4.  

## 2020-05-23 NOTE — Progress Notes (Signed)
   Follow-Up Visit   Subjective  Margaret Silva is a 40 y.o. female who presents for the following: tbse (Patient here today for tbse. Patient denies any personal or family history of skin cancer. She reports no concerns today at appointment. ).  Patient here for full body skin exam and skin cancer screening.  The following portions of the chart were reviewed this encounter and updated as appropriate:  Tobacco  Allergies  Meds  Problems  Med Hx  Surg Hx  Fam Hx      Objective  Well appearing patient in no apparent distress; mood and affect are within normal limits.  A full examination was performed including scalp, head, eyes, ears, nose, lips, neck, chest, axillae, abdomen, back, buttocks, bilateral upper extremities, bilateral lower extremities, hands, feet, fingers, toes, fingernails, and toenails. All findings within normal limits unless otherwise noted below.  Objective  right lateral knee: 0.3 cm dark brown macule   Assessment & Plan  Nevus right lateral knee  Benign-appearing.  Observation.  Call clinic for new or changing lesions.  Recommend daily use of broad spectrum spf 30+ sunscreen to sun-exposed areas.   Will recheck at follow up  Belk - Scattered tan macules - Due to sun exposure - Benign-appering, observe - Recommend daily broad spectrum sunscreen SPF 30+ to sun-exposed areas, reapply every 2 hours as needed. - Call for any changes  Seborrheic Keratoses - Stuck-on, waxy, tan-brown papules and/or plaques  - Benign-appearing - Discussed benign etiology and prognosis. - Observe - Call for any changes  Melanocytic Nevi - Tan-brown and/or pink-flesh-colored symmetric macules and papules - Benign appearing on exam today - Observation - Call clinic for new or changing moles - Recommend daily use of broad spectrum spf 30+ sunscreen to sun-exposed areas.   Hemangiomas - Red papules - Discussed benign nature - Observe - Call for any  changes  Actinic Damage - Chronic condition, secondary to cumulative UV/sun exposure - diffuse scaly erythematous macules with underlying dyspigmentation - Recommend daily broad spectrum sunscreen SPF 30+ to sun-exposed areas, reapply every 2 hours as needed.  - Staying in the shade or wearing long sleeves, sun glasses (UVA+UVB protection) and wide brim hats (4-inch brim around the entire circumference of the hat) are also recommended for sun protection.  - Call for new or changing lesions.  Skin cancer screening performed today. Garry Heater, CMA, am acting as scribe for Forest Gleason, MD.  Return in about 1 year (around 05/23/2021) for tbse.   Documentation: I have reviewed the above documentation for accuracy and completeness, and I agree with the above.  Forest Gleason, MD

## 2020-05-24 ENCOUNTER — Other Ambulatory Visit: Payer: Self-pay | Admitting: Family Medicine

## 2020-05-24 DIAGNOSIS — F5104 Psychophysiologic insomnia: Secondary | ICD-10-CM

## 2020-06-03 ENCOUNTER — Encounter: Payer: Self-pay | Admitting: Dermatology

## 2020-06-04 ENCOUNTER — Other Ambulatory Visit: Payer: Self-pay | Admitting: Family Medicine

## 2020-06-04 DIAGNOSIS — B009 Herpesviral infection, unspecified: Secondary | ICD-10-CM

## 2020-06-25 ENCOUNTER — Other Ambulatory Visit: Payer: Self-pay

## 2020-06-28 ENCOUNTER — Ambulatory Visit (INDEPENDENT_AMBULATORY_CARE_PROVIDER_SITE_OTHER): Payer: Managed Care, Other (non HMO) | Admitting: Family Medicine

## 2020-06-28 ENCOUNTER — Other Ambulatory Visit: Payer: Self-pay

## 2020-06-28 ENCOUNTER — Encounter: Payer: Self-pay | Admitting: Family Medicine

## 2020-06-28 DIAGNOSIS — R439 Unspecified disturbances of smell and taste: Secondary | ICD-10-CM

## 2020-06-28 DIAGNOSIS — F32A Depression, unspecified: Secondary | ICD-10-CM | POA: Diagnosis not present

## 2020-06-28 DIAGNOSIS — F419 Anxiety disorder, unspecified: Secondary | ICD-10-CM | POA: Diagnosis not present

## 2020-06-28 DIAGNOSIS — F5104 Psychophysiologic insomnia: Secondary | ICD-10-CM | POA: Diagnosis not present

## 2020-06-28 NOTE — Patient Instructions (Signed)
Nice to see you. Please contact the therapist.  If you have trouble getting into see them please let me know. Please contact her ENT physician regarding your smell and taste issues.

## 2020-06-28 NOTE — Assessment & Plan Note (Addendum)
Patient has both anxiety and depression which have worsened recently.  I counseled that she should seek medical attention in the emergency department if she develops suicidal ideation.  She is on max dose Cymbalta.  She is also on Elavil which is for her migraines though could also be a benefiting this issue.  Discussed having her see a Social worker.  She was provided with contact information.  If she has trouble getting into see them she will let us know.  Undetermined cause for getting her words mixed up.  I encouraged her to discuss this with her neurologist.  She will also send [place] an example the next time it occurs as she was unable to provide [place] with a great example today.

## 2020-06-28 NOTE — Assessment & Plan Note (Signed)
The patient can continue on Ambien 6.25 mg once nightly as needed.

## 2020-06-28 NOTE — Assessment & Plan Note (Signed)
This is an ongoing issue since she had an upper respiratory infection 5 months ago.  This makes me think she had COVID-19 in January given these persistent symptoms.  I encouraged her to discuss them with her ENT physician to determine if any further evaluation or treatment is necessary.

## 2020-06-28 NOTE — Progress Notes (Signed)
Tommi Rumps, MD Phone: [phone]  Margaret Silva is a 40 y.o. female who presents today for f/u.  Anxiety/depression: Patient notes anxiety and depression since her dog passed away 2 months ago.  She had her dog for 14 years and the death was somewhat sudden.  She continues on Cymbalta 90 mg once daily.  She is also on Elavil which is for her migraines.  She had trouble sleeping at first and now she just wants to sleep.  She has been pulling her hair out as a nervous tic.  No SI.  She does report for many months now she gets her words mixed up at times.  Started last year possibly.  She notes no numbness or weakness.  Insomnia: She is taking Ambien with good benefit.  This does help her sleep.  There is no drowsiness the next day.  No alcohol intake.  Taste and smell disturbance: This has been an ongoing issue since she was sick with a sinus infection in January.  She had a negative COVID test though her taste and smell have not returned completely.  She does follow with an allergist at [facility].  Social History   Tobacco Use  Smoking Status Never Smoker  Smokeless Tobacco Never Used    Current Outpatient Medications on File Prior to Visit  Medication Sig Dispense Refill  . albuterol (PROVENTIL HFA;VENTOLIN HFA) 108 (90 Base) MCG/ACT inhaler Inhale into the lungs every 6 (six) hours as needed for wheezing or shortness of breath.    Marland Kitchen amitriptyline (ELAVIL) 50 MG tablet Take 1 tablet (50 mg total) by mouth at bedtime. 90 tablet 4  . Cholecalciferol (VITAMIN D PO) Take 5,000 Int'l Units by mouth once a week.    . diclofenac sodium (VOLTAREN) 1 % GEL Apply 4 g topically 3 (three) times daily as needed.     . divalproex (DEPAKOTE) 500 MG DR tablet Take 1 tablet (500 mg total) by mouth daily. 90 tablet 4  . DULoxetine (CYMBALTA) 30 MG capsule TAKE THREE CAPSULES DAILY 270 capsule 1  . EPINEPHrine 0.3 mg/0.3 mL IJ SOAJ injection Inject into the muscle.    . fluticasone  (FLONASE) 50 MCG/ACT nasal spray Place 2 sprays into both nostrils daily. 16 g 6  . Fluticasone Propionate, Inhal, (FLOVENT IN) Inhale into the lungs as needed.    Marland Kitchen gentamicin cream (GARAMYCIN) 0.1 % Apply 1 application topically as needed (for nose).    Marland Kitchen ketoconazole (NIZORAL) 2 % cream Apply to crease of nose twice daily as needed for flake and itch. 60 g 3  . loratadine (CLARITIN) 10 MG tablet TAKE 1 TABLET (10 MG TOTAL) BY MOUTH DAILY. 90 tablet 3  . omeprazole (PRILOSEC) 20 MG capsule TAKE 1 CAPSULE BY MOUTH DAILY 90 capsule 3  . ondansetron (ZOFRAN-ODT) 4 MG disintegrating tablet Take 1 tablet (4 mg total) by mouth every 8 (eight) hours as needed for nausea. 30 tablet 12  . terbinafine (LAMISIL) 250 MG tablet Take 1 tablet (250 mg total) by mouth daily. Patient advised to take 1 by mouth daily the first week of months 1, 3, 5 and 7 28 tablet 0  . valACYclovir (VALTREX) 500 MG tablet TAKE 1 TABLET BY MOUTH DAILY 90 tablet 1  . zolpidem (AMBIEN CR) 6.25 MG CR tablet TAKE 1 TABLET BY MOUTH AT BEDTIME IF NEEDED FOR SLEEP 30 tablet 0   No current facility-administered medications on file prior to visit.     ROS see history of present  illness  Objective  Physical Exam Vitals:   06/28/20 1538  BP: 110/60  Pulse: 88  Temp: 98.8 F (37.1 C)  SpO2: 99%    BP Readings from Last 3 Encounters:  06/28/20 110/60  04/30/20 126/83  03/29/20 120/70   Wt Readings from Last 3 Encounters:  06/28/20 197 lb (89.4 kg)  04/30/20 198 lb (89.8 kg)  03/29/20 197 lb 12.8 oz (89.7 kg)    Physical Exam Constitutional:      General: She is not in acute distress.    Appearance: She is not diaphoretic.  Cardiovascular:     Rate and Rhythm: Normal rate and regular rhythm.     Heart sounds: Normal heart sounds.  Pulmonary:     Effort: Pulmonary effort is normal.     Breath sounds: Normal breath sounds.  Skin:    General: Skin is warm and dry.  Neurological:     Mental Status: She is alert.   Psychiatric:     Comments: Mood depressed, affect mildly flat      Assessment/Plan: Please see individual problem list.  Problem List Items Addressed This Visit    Chronic insomnia    The patient can continue on Ambien 6.25 mg once nightly as needed.      Anxiety and depression    Patient has both anxiety and depression which have worsened recently.  I counseled that she should seek medical attention in the emergency department if she develops suicidal ideation.  She is on max dose Cymbalta.  She is also on Elavil which is for her migraines though could also be a benefiting this issue.  Discussed having her see a Social worker.  She was provided with contact information.  If she has trouble getting into see them she will let us know.  Undetermined cause for getting her words mixed up.  I encouraged her to discuss this with her neurologist.  She will also send [place] an example the next time it occurs as she was unable to provide [place] with a great example today.      Disturbances of sensation of smell and taste    This is an ongoing issue since she had an upper respiratory infection 5 months ago.  This makes me think she had COVID-19 in January given these persistent symptoms.  I encouraged her to discuss them with her ENT physician to determine if any further evaluation or treatment is necessary.        Return in about 2 months (around 08/28/2020) for Anxiety/depression.  This visit occurred during the SARS-CoV-2 public health emergency.  Safety protocols were in place, including screening questions prior to the visit, additional usage of staff PPE, and extensive cleaning of exam room while observing appropriate contact time as indicated for disinfecting solutions.    Tommi Rumps, MD [place]

## 2020-07-29 ENCOUNTER — Other Ambulatory Visit: Payer: Self-pay | Admitting: Family Medicine

## 2020-07-29 DIAGNOSIS — F5104 Psychophysiologic insomnia: Secondary | ICD-10-CM

## 2020-08-21 ENCOUNTER — Ambulatory Visit: Payer: Managed Care, Other (non HMO) | Admitting: Advanced Practice Midwife

## 2020-08-28 ENCOUNTER — Other Ambulatory Visit: Payer: Self-pay | Admitting: Family Medicine

## 2020-08-28 DIAGNOSIS — F5104 Psychophysiologic insomnia: Secondary | ICD-10-CM

## 2020-08-28 NOTE — Telephone Encounter (Signed)
RX Refill:ambien Last Seen:06-28-20 Last ordered:07-29-20

## 2020-08-30 ENCOUNTER — Ambulatory Visit: Payer: Managed Care, Other (non HMO) | Admitting: Family Medicine

## 2020-08-30 ENCOUNTER — Encounter: Payer: Self-pay | Admitting: Family Medicine

## 2020-08-30 ENCOUNTER — Other Ambulatory Visit: Payer: Self-pay

## 2020-08-30 VITALS — BP 120/70 | HR 86 | Temp 98.5°F | Ht 64.0 in | Wt 198.4 lb

## 2020-08-30 DIAGNOSIS — F32A Depression, unspecified: Secondary | ICD-10-CM

## 2020-08-30 DIAGNOSIS — R3915 Urgency of urination: Secondary | ICD-10-CM | POA: Insufficient documentation

## 2020-08-30 DIAGNOSIS — F419 Anxiety disorder, unspecified: Secondary | ICD-10-CM

## 2020-08-30 LAB — POCT URINALYSIS DIPSTICK
Bilirubin, UA: NEGATIVE
Glucose, UA: NEGATIVE
Ketones, UA: NEGATIVE
Nitrite, UA: NEGATIVE
Protein, UA: NEGATIVE
Spec Grav, UA: 1.01 (ref 1.010–1.025)
Urobilinogen, UA: 0.2 E.U./dL
pH, UA: 5.5 (ref 5.0–8.0)

## 2020-08-30 NOTE — Assessment & Plan Note (Signed)
Generally improving.  She will continue Cymbalta and Elavil.  I discussed that the combination of those medicines limits any additional medication.  She will attempt to find a new counselor.

## 2020-08-30 NOTE — Assessment & Plan Note (Addendum)
Potentially this could be related to interstitial cystitis though UTI is also a possibility. We will send urine for culture and micro.

## 2020-08-30 NOTE — Progress Notes (Signed)
Margaret Rumps, MD Phone: [phone]  MIROSLAVA SANTELLAN is a 40 y.o. female who presents today for follow-up.  Anxiety/depression: Patient is on Cymbalta and Elavil.  She notes she is doing better than she was at her last visit.  She did have a family member pass away unexpectedly and then a couple other people she knew passed away as well.  Her older sister is inviting her to do more stuff and that has been helpful.  She did see a counselor 1 time though that counselor left the practice.  She is working on getting in to see somebody else.  No SI or HI.  Urinary urgency: Notes this started today.  She will get an urge though not much will come out.  She does note some frequency.  No dysuria.  No itching.  No abdominal pain.  She does have a history of interstitial cystitis.  Social History   Tobacco Use  Smoking Status Never  Smokeless Tobacco Never    Current Outpatient Medications on File Prior to Visit  Medication Sig Dispense Refill   albuterol (PROVENTIL HFA;VENTOLIN HFA) 108 (90 Base) MCG/ACT inhaler Inhale into the lungs every 6 (six) hours as needed for wheezing or shortness of breath.     amitriptyline (ELAVIL) 50 MG tablet Take 1 tablet (50 mg total) by mouth at bedtime. 90 tablet 4   Cholecalciferol (VITAMIN D PO) Take 5,000 Int'l Units by mouth once a week.     diclofenac sodium (VOLTAREN) 1 % GEL Apply 4 g topically 3 (three) times daily as needed.      divalproex (DEPAKOTE) 500 MG DR tablet Take 1 tablet (500 mg total) by mouth daily. 90 tablet 4   DULoxetine (CYMBALTA) 30 MG capsule TAKE THREE CAPSULES DAILY 270 capsule 1   EPINEPHrine 0.3 mg/0.3 mL IJ SOAJ injection Inject into the muscle.     fluticasone (FLONASE) 50 MCG/ACT nasal spray Place 2 sprays into both nostrils daily. 16 g 6   Fluticasone Propionate, Inhal, (FLOVENT IN) Inhale into the lungs as needed.     gentamicin cream (GARAMYCIN) 0.1 % Apply 1 application topically as needed (for nose).      ketoconazole (NIZORAL) 2 % cream Apply to crease of nose twice daily as needed for flake and itch. 60 g 3   loratadine (CLARITIN) 10 MG tablet TAKE 1 TABLET (10 MG TOTAL) BY MOUTH DAILY. 90 tablet 3   omeprazole (PRILOSEC) 20 MG capsule TAKE 1 CAPSULE BY MOUTH DAILY 90 capsule 3   ondansetron (ZOFRAN-ODT) 4 MG disintegrating tablet Take 1 tablet (4 mg total) by mouth every 8 (eight) hours as needed for nausea. 30 tablet 12   terbinafine (LAMISIL) 250 MG tablet Take 1 tablet (250 mg total) by mouth daily. Patient advised to take 1 by mouth daily the first week of months 1, 3, 5 and 7 28 tablet 0   valACYclovir (VALTREX) 500 MG tablet TAKE 1 TABLET BY MOUTH DAILY 90 tablet 1   zolpidem (AMBIEN CR) 6.25 MG CR tablet TAKE 1 TABLET BY MOUTH AT BEDTIME IF NEEDED FOR SLEEP 30 tablet 0   No current facility-administered medications on file prior to visit.     ROS see history of present illness  Objective  Physical Exam Vitals:   08/30/20 1619  BP: 120/70  Pulse: 86  Temp: 98.5 F (36.9 C)  SpO2: 97%    BP Readings from Last 3 Encounters:  08/30/20 120/70  06/28/20 110/60  04/30/20 126/83   Wt Readings  from Last 3 Encounters:  08/30/20 198 lb 6.4 oz (90 kg)  06/28/20 197 lb (89.4 kg)  04/30/20 198 lb (89.8 kg)    Physical Exam Constitutional:      General: She is not in acute distress.    Appearance: She is not diaphoretic.  Cardiovascular:     Rate and Rhythm: Normal rate and regular rhythm.     Heart sounds: Normal heart sounds.  Pulmonary:     Effort: Pulmonary effort is normal.     Breath sounds: Normal breath sounds.  Abdominal:     General: Bowel sounds are normal. There is no distension.     Palpations: Abdomen is soft.     Tenderness: There is abdominal tenderness (Slight suprapubic tenderness). There is no guarding or rebound.  Skin:    General: Skin is warm and dry.  Neurological:     Mental Status: She is alert.     Assessment/Plan: Please see individual  problem list.  Problem List Items Addressed This Visit     Anxiety and depression    Generally improving.  She will continue Cymbalta and Elavil.  I discussed that the combination of those medicines limits any additional medication.  She will attempt to find a new counselor.       Urinary urgency - Primary    Potentially this could be related to interstitial cystitis though UTI is also a possibility. We will send urine for culture and micro.        Relevant Orders   POCT Urinalysis Dipstick (Completed)   Urine Microscopic Only   Urine Culture    Return in about 3 months (around 11/30/2020) for Anxiety and depression.  This visit occurred during the SARS-CoV-2 public health emergency.  Safety protocols were in place, including screening questions prior to the visit, additional usage of staff PPE, and extensive cleaning of exam room while observing appropriate contact time as indicated for disinfecting solutions.    Margaret Rumps, MD [place]

## 2020-08-30 NOTE — Patient Instructions (Signed)
Nice to see you. Please let us know if your depression gets worse.  You should try to get in with a new counselor to see if that is helpful. We will check your urine and let you know what the results show.

## 2020-08-31 LAB — URINALYSIS, MICROSCOPIC ONLY
Bacteria, UA: NONE SEEN
Casts: NONE SEEN /lpf
WBC, UA: >30 /hpf — AB (ref 0–5)

## 2020-09-02 ENCOUNTER — Other Ambulatory Visit: Payer: Self-pay | Admitting: Family Medicine

## 2020-09-02 DIAGNOSIS — R8290 Unspecified abnormal findings in urine: Secondary | ICD-10-CM

## 2020-09-02 LAB — URINE CULTURE

## 2020-09-02 MED ORDER — NITROFURANTOIN MONOHYD MACRO 100 MG PO CAPS: 100.0000 mg | ORAL_CAPSULE | Freq: Two times a day (BID) | ORAL | 0 refills | Status: DC

## 2020-09-03 ENCOUNTER — Telehealth: Payer: Managed Care, Other (non HMO) | Admitting: Nurse Practitioner

## 2020-09-03 ENCOUNTER — Telehealth: Payer: Self-pay

## 2020-09-03 DIAGNOSIS — J0101 Acute recurrent maxillary sinusitis: Secondary | ICD-10-CM | POA: Diagnosis not present

## 2020-09-03 MED ORDER — PREDNISONE 20 MG PO TABS
40.0000 mg | ORAL_TABLET | Freq: Every day | ORAL | 0 refills | Status: AC
Start: 2020-09-03 — End: 2020-09-08

## 2020-09-03 MED ORDER — AMOXICILLIN-POT CLAVULANATE 875-125 MG PO TABS: 1.0000 | ORAL_TABLET | Freq: Two times a day (BID) | ORAL | 0 refills | Status: DC

## 2020-09-03 MED ORDER — BENZONATATE 100 MG PO CAPS: 100.0000 mg | ORAL_CAPSULE | Freq: Two times a day (BID) | ORAL | 0 refills | Status: DC | PRN

## 2020-09-03 NOTE — Telephone Encounter (Signed)
See result note message 

## 2020-09-03 NOTE — Telephone Encounter (Signed)
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

## 2020-09-03 NOTE — Progress Notes (Signed)
E-Visit for Sinus Problems  We are sorry that you are not feeling well.  Here is how we plan to help!  Based on what you have shared with me it looks like you have sinusitis.  Sinusitis is inflammation and infection in the sinus cavities of the head.  Based on your presentation I believe you most likely have Acute Bacterial Sinusitis.  This is an infection caused by bacteria and is treated with antibiotics. I have prescribed Augmentin 875mg /125mg  one tablet twice daily with food, for 7 days. You may use an oral decongestant such as Mucinex D or if you have glaucoma or high blood pressure use plain Mucinex. Saline nasal spray help and can safely be used as often as needed for congestion.  If you develop worsening sinus pain, fever or notice severe headache and vision changes, or if symptoms are not better after completion of antibiotic, please schedule an appointment with a health care provider.    I have also called in prednisone 20mg  2 tablets at same time daily for 5 days,  ( We usually do not use 6 day dose pack for sinusitis in an e visit ) As well as tessalon perles for cough   Sinus infections are not as easily transmitted as other respiratory infection, however we still recommend that you avoid close contact with loved ones, especially the very young and elderly.  Remember to wash your hands thoroughly throughout the day as this is the number one way to prevent the spread of infection!  Home Care: Only take medications as instructed by your medical team. Complete the entire course of an antibiotic. Do not take these medications with alcohol. A steam or ultrasonic humidifier can help congestion.  You can place a towel over your head and breathe in the steam from hot water coming from a faucet. Avoid close contacts especially the very young and the elderly. Cover your mouth when you cough or sneeze. Always remember to wash your hands.  Get Help Right Away If: You develop worsening fever or  sinus pain. You develop a severe head ache or visual changes. Your symptoms persist after you have completed your treatment plan.  Make sure you Understand these instructions. Will watch your condition. Will get help right away if you are not doing well or get worse.  Thank you for choosing an e-visit.  Your e-visit answers were reviewed by a board certified advanced clinical practitioner to complete your personal care plan. Depending upon the condition, your plan could have included both over the counter or prescription medications.  Please review your pharmacy choice. Make sure the pharmacy is open so you can pick up prescription now. If there is a problem, you may contact your provider through [employer] and have the prescription routed to another pharmacy.  Your safety is important to [place]. If you have drug allergies check your prescription carefully.   For the next 24 hours you can use MyChart to ask questions about today's visit, request a non-urgent call back, or ask for a work or school excuse. You will get an email in the next two days asking about your experience. I hope that your e-visit has been valuable and will speed your recovery.  5-10 minutes spent reviewing and documenting in chart.

## 2020-09-03 NOTE — Telephone Encounter (Signed)
LMTCB in regards to lab results.  

## 2020-09-04 ENCOUNTER — Ambulatory Visit
Admission: EM | Admit: 2020-09-04 | Discharge: 2020-09-04 | Disposition: A | Discharge status: Home / Self Care | Payer: Managed Care, Other (non HMO) | Attending: Internal Medicine | Admitting: Internal Medicine

## 2020-09-04 ENCOUNTER — Other Ambulatory Visit: Payer: Self-pay

## 2020-09-04 DIAGNOSIS — J209 Acute bronchitis, unspecified: Secondary | ICD-10-CM | POA: Diagnosis not present

## 2020-09-04 MED ORDER — AZITHROMYCIN 200 MG/5ML PO SUSR: 200.0000 mg | Freq: Every day | ORAL | 0 refills | Status: DC

## 2020-09-04 MED ORDER — ALBUTEROL SULFATE HFA 108 (90 BASE) MCG/ACT IN AERS
2.0000 | INHALATION_SPRAY | RESPIRATORY_TRACT | 0 refills | Status: DC | PRN
Start: 2020-09-04 — End: 2022-12-16

## 2020-09-04 MED ORDER — METHYLPREDNISOLONE 4 MG PO TBPK: ORAL_TABLET | ORAL | 0 refills | Status: DC

## 2020-09-04 NOTE — ED Triage Notes (Signed)
Pt reports having a productive cough, facial pain and runny nose since last Wednesday.
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
## 2020-09-13 ENCOUNTER — Encounter: Payer: Managed Care, Other (non HMO) | Admitting: Certified Nurse Midwife

## 2020-09-27 ENCOUNTER — Other Ambulatory Visit: Payer: Self-pay | Admitting: Family Medicine

## 2020-09-27 DIAGNOSIS — F419 Anxiety disorder, unspecified: Secondary | ICD-10-CM

## 2020-09-27 DIAGNOSIS — F5104 Psychophysiologic insomnia: Secondary | ICD-10-CM

## 2020-10-01 ENCOUNTER — Other Ambulatory Visit (INDEPENDENT_AMBULATORY_CARE_PROVIDER_SITE_OTHER): Payer: Managed Care, Other (non HMO)

## 2020-10-01 ENCOUNTER — Other Ambulatory Visit: Payer: Self-pay

## 2020-10-01 DIAGNOSIS — R8290 Unspecified abnormal findings in urine: Secondary | ICD-10-CM | POA: Diagnosis not present

## 2020-10-01 LAB — POCT URINALYSIS DIPSTICK
Bilirubin, UA: NEGATIVE
Blood, UA: NEGATIVE
Glucose, UA: NEGATIVE
Ketones, UA: NEGATIVE
Leukocytes, UA: NEGATIVE
Nitrite, UA: NEGATIVE
Protein, UA: NEGATIVE
Spec Grav, UA: >=1.03 — AB (ref 1.010–1.025)
Urobilinogen, UA: 0.2 E.U./dL
pH, UA: 6 (ref 5.0–8.0)

## 2020-10-01 NOTE — Addendum Note (Signed)
Addended by: Leeanne Rio on: 10/01/2020 03:22 PM   Modules accepted: Orders

## 2020-10-02 ENCOUNTER — Encounter: Payer: Self-pay | Admitting: Advanced Practice Midwife

## 2020-10-02 ENCOUNTER — Ambulatory Visit (INDEPENDENT_AMBULATORY_CARE_PROVIDER_SITE_OTHER): Payer: Managed Care, Other (non HMO) | Admitting: Advanced Practice Midwife

## 2020-10-02 VITALS — BP 123/83 | HR 102 | Ht 64.0 in | Wt 200.6 lb

## 2020-10-02 DIAGNOSIS — Z113 Encounter for screening for infections with a predominantly sexual mode of transmission: Secondary | ICD-10-CM

## 2020-10-02 DIAGNOSIS — Z01419 Encounter for gynecological examination (general) (routine) without abnormal findings: Secondary | ICD-10-CM | POA: Diagnosis not present

## 2020-10-02 DIAGNOSIS — Z23 Encounter for immunization: Secondary | ICD-10-CM

## 2020-10-02 LAB — URINALYSIS, MICROSCOPIC ONLY
Casts: NONE SEEN /lpf
Epithelial Cells (non renal): >10 /hpf — AB (ref 0–10)

## 2020-10-02 NOTE — Progress Notes (Signed)
GYNECOLOGY ANNUAL PREVENTATIVE CARE ENCOUNTER NOTE  History:     Margaret Silva is a 40 y.o. G0P0000 female here for a routine annual gynecologic exam.  Current complaints: none.   Denies abnormal vaginal bleeding, discharge, pelvic pain, problems with intercourse or other gynecologic concerns.   Works for [employer]. Not currently sexually active but was seeing someone early 2022. Non-smoker, has weight loss goals and is increasing her exercise each week.    Gynecologic History No LMP recorded. (Menstrual status: IUD). Contraception: IUD Last Pap: 2020. Result was normal with negative HPV Mammogram: normal screening 06/2016   Obstetric History OB History  Gravida Para Term Preterm AB Living  0 0 0 0 0 0  SAB IAB Ectopic Multiple Live Births  0 0 0 0 0    Past Medical History:  Diagnosis Date   Asthma    Bursitis of left hip    Diverticulitis    h/o   Endometriosis    GERD (gastroesophageal reflux disease)    Headache    History of chicken pox    History of colonoscopy 11/04 and April 06   History of laparoscopy 12/04 and 12/07   History of laparoscopy 01/29/2006   Dr. 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    Past Surgical History:  Procedure Laterality Date   CYSTOSCOPY WITH HYDRODISTENSION AND BIOPSY     DIAGNOSTIC LAPAROSCOPY  02/16/2003   ESOPHAGOGASTRODUODENOSCOPY (EGD) WITH PROPOFOL N/A 09/30/2018   Procedure: ESOPHAGOGASTRODUODENOSCOPY (EGD) WITH PROPOFOL;  Surgeon: Virgel Manifold, MD;  Location: [facility];  Service: Endoscopy;  Laterality: N/A;   SIGMOIDOSCOPY  05/26/10    Current Outpatient Medications on File Prior to Visit   Medication Sig Dispense Refill   albuterol (VENTOLIN HFA) 108 (90 Base) MCG/ACT inhaler Inhale 2 puffs into the lungs every 4 (four) hours as needed for wheezing or shortness of breath. 18 each 0   amitriptyline (ELAVIL) 50 MG tablet Take 1 tablet (50 mg total) by mouth at bedtime. 90 tablet 4   azithromycin (ZITHROMAX) 200 MG/5ML suspension Take 5 mLs (200 mg total) by mouth daily. 22.5 mL 0   Cholecalciferol (VITAMIN D PO) Take 5,000 Int'l Units by mouth once a week.     DULoxetine (CYMBALTA) 30 MG capsule TAKE THREE CAPSULES DAILY 270 capsule 1   fluticasone (FLONASE) 50 MCG/ACT nasal spray Place 2 sprays into both nostrils daily. 16 g 6   gentamicin cream (GARAMYCIN) 0.1 % Apply 1 application topically as needed (for nose).     ketoconazole (NIZORAL) 2 % cream Apply to crease of nose twice daily as needed for flake and itch. 60 g 3   loratadine (CLARITIN) 10 MG tablet TAKE 1 TABLET (10 MG TOTAL) BY MOUTH DAILY. 90 tablet 3   omeprazole (PRILOSEC) 20 MG capsule TAKE 1 CAPSULE BY MOUTH DAILY 90 capsule 3   valACYclovir (VALTREX) 500 MG tablet TAKE 1 TABLET BY MOUTH DAILY 90 tablet 1   zolpidem (AMBIEN CR) 6.25 MG CR tablet TAKE 1 TABLET BY MOUTH AT BEDTIME IF NEEDED FOR SLEEP 30 tablet 0   benzonatate (TESSALON) 100 MG capsule Take 1 capsule (100 mg  total) by mouth 2 (two) times daily as needed for cough. (Patient not taking: Reported on 10/02/2020) 20 capsule 0   diclofenac sodium (VOLTAREN) 1 % GEL Apply 4 g topically 3 (three) times daily as needed.  (Patient not taking: Reported on 10/02/2020)     divalproex (DEPAKOTE) 500 MG DR tablet Take 1 tablet (500 mg total) by mouth daily. 90 tablet 4   EPINEPHrine 0.3 mg/0.3 mL IJ SOAJ injection Inject into the muscle. (Patient not taking: Reported on 10/02/2020)     Fluticasone Propionate, Inhal, (FLOVENT IN) Inhale into the lungs as needed. (Patient not taking: Reported on 10/02/2020)     methylPREDNISolone (MEDROL DOSEPAK) 4 MG TBPK tablet Take as  directed (Patient not taking: Reported on 10/02/2020) 21 tablet 0   ondansetron (ZOFRAN-ODT) 4 MG disintegrating tablet Take 1 tablet (4 mg total) by mouth every 8 (eight) hours as needed for nausea. (Patient not taking: Reported on 10/02/2020) 30 tablet 12   terbinafine (LAMISIL) 250 MG tablet Take 1 tablet (250 mg total) by mouth daily. Patient advised to take 1 by mouth daily the first week of months 1, 3, 5 and 7 (Patient not taking: Reported on 10/02/2020) 28 tablet 0   No current facility-administered medications on file prior to visit.    Allergies  Allergen Reactions   Coffee Bean Extract  [Coffea Arabica] Nausea Only   Other     Outdoor allergies    Trazodone And Nefazodone Other (See Comments)    irritability    Social History:  reports that she has never smoked. She has never used smokeless tobacco. She reports current alcohol use. She reports that she does not use drugs.  Family History  Problem Relation Age of Onset   Diabetes Maternal Grandmother    Arthritis Maternal Grandmother    Kidney disease Maternal Grandmother    Hypertension Father    Hyperlipidemia Father    Fibromyalgia Mother    Migraines Mother    Cancer Mother 30       breast   Breast cancer Mother    Ehlers-Danlos syndrome Sister    Fibromyalgia Sister    Thyroid cancer Sister    Heart attack Paternal Grandfather    Cancer Paternal Grandfather        Pancreatic/ Bladder Cancer/prostate   Arthritis Paternal Grandfather    Heart disease Paternal Grandfather    Cancer Paternal Grandmother        Liver cancer/lung cancer   Diabetes Paternal Grandmother    Heart attack Maternal Grandfather    Heart disease Maternal Grandfather    Hypertension Maternal Grandfather    Mental illness Maternal Grandfather    Ovarian cancer Neg Hx    Colon cancer Neg Hx     The following portions of the patient's history were reviewed and updated as appropriate: allergies, current medications, past family history, past  medical history, past social history, past surgical history and problem list.  Review of Systems Pertinent items noted in HPI and remainder of comprehensive ROS otherwise negative.  Physical Exam:  BP 123/83   Pulse (!) 102   Ht '5\' 4"'$  (1.626 m)   Wt 200 lb 9.6 oz (91 kg)   BMI 34.43 kg/m  CONSTITUTIONAL: Well-developed, well-nourished female in no acute distress.  HENT:  Normocephalic, atraumatic, External right and left ear normal.  EYES: Conjunctivae and EOM are normal. Pupils are equal, round, and reactive to light. No scleral icterus.  NECK: Normal range of motion, supple, no masses.  Normal  thyroid.  SKIN: Skin is warm and dry. No rash noted. Not diaphoretic. No erythema. No pallor. MUSCULOSKELETAL: Normal range of motion. No tenderness.  No cyanosis, clubbing, or edema. NEUROLOGIC: Alert and oriented to person, place, and time. Normal reflexes, muscle tone coordination.  PSYCHIATRIC: Normal mood and affect. Normal behavior. Normal judgment and thought content. CARDIOVASCULAR: Normal heart rate noted, regular rhythm RESPIRATORY: Clear to auscultation bilaterally. Effort and breath sounds normal, no problems with respiration noted. BREASTS: Symmetric in size. No masses, tenderness, skin changes, nipple drainage, or lymphadenopathy bilaterally. Performed in the presence of a chaperone. ABDOMEN: Soft, no distention noted.  No tenderness, rebound or guarding.  PELVIC: Deferred palpable masses, no uterine or adnexal tenderness.  Performed in the presence of a chaperone.   Assessment and Plan:    1. Well woman exam - Pap not due - Hepatitis B surface antigen - Hepatitis C antibody - HIV Antibody (routine testing w rflx) - RPR  2. Need for Tdap vaccination  - Tdap vaccine greater than or equal to 7yo IM  3. Screening for STDs (sexually transmitted diseases) - Self swab - Chlamydia/Gonococcus/Trichomonas, NAA  Needs Mammogram 02/2021  Will follow up on results of  cervicovaginal swab and manage accordingly Routine preventative health maintenance measures emphasized. Please refer to After Visit Summary for other counseling recommendations.      Mallie Snooks, MSN, CNM Certified Nurse Midwife, Product/process development scientist for [employer], [place]

## 2020-10-03 LAB — HEPATITIS C ANTIBODY: Hep C Virus Ab: 0.2 s/co ratio (ref 0.0–0.9)

## 2020-10-03 LAB — HEPATITIS B SURFACE ANTIGEN: Hepatitis B Surface Ag: NEGATIVE

## 2020-10-03 LAB — RPR: RPR Ser Ql: NONREACTIVE

## 2020-10-03 LAB — HIV ANTIBODY (ROUTINE TESTING W REFLEX): HIV Screen 4th Generation wRfx: NONREACTIVE

## 2020-10-06 LAB — CHLAMYDIA/GONOCOCCUS/TRICHOMONAS, NAA
Chlamydia by NAA: NEGATIVE
Gonococcus by NAA: NEGATIVE
Trich vag by NAA: NEGATIVE

## 2020-10-10 ENCOUNTER — Other Ambulatory Visit: Payer: Self-pay | Admitting: Family Medicine

## 2020-10-10 DIAGNOSIS — R3129 Other microscopic hematuria: Secondary | ICD-10-CM
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
## 2020-10-28 ENCOUNTER — Other Ambulatory Visit: Payer: Self-pay | Admitting: Family Medicine

## 2020-10-28 DIAGNOSIS — B009 Herpesviral infection, unspecified: Secondary | ICD-10-CM

## 2020-10-28 DIAGNOSIS — F5104 Psychophysiologic insomnia: Secondary | ICD-10-CM

## 2020-10-28 NOTE — Progress Notes (Signed)
10/29/20 3:25 PM   [name] 1980/05/08 [id number]  Referring provider:  Leone Haven, MD [address] [place],  [place] [address] Chief Complaint  Patient presents with   Hematuria     HPI: Margaret Silva is a 40 y.o.female who presents today for further evaluation of microscopic hematuria.   She had a UTI in July that grew E.coli, she was retested and appeared contaminated as outlined below.  Urinalysis on 10/01/2020 showed negative for blood. Microscopic urinalysis showed 11-30 WBC, 3-10 RBC, and >10 epithelial cells.   She reports the she has not seen blood in her urine. She has IC and was diagnosed by Dr. Jacqlyn Larsen.  She was on medication for many years but ultimately stopped this medication due to concerns about the side effects.  She now managed conservatively with behavioral modification.  She rarely has flares.  No flank pain, gross hematuria, history of kidney stones, or any other GU complaints today.   PMH: Past Medical History:  Diagnosis Date   Asthma    Bursitis of left hip    Diverticulitis    h/o   Endometriosis    GERD (gastroesophageal reflux disease)    Headache    History of chicken pox    History of colonoscopy 11/04 and April 06   History of laparoscopy 12/04 and 12/07   History of laparoscopy 01/29/2006   Dr. 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    Surgical History: Past Surgical History:  Procedure Laterality Date   CYSTOSCOPY WITH HYDRODISTENSION AND BIOPSY     DIAGNOSTIC LAPAROSCOPY  02/16/2003   ESOPHAGOGASTRODUODENOSCOPY (EGD) WITH PROPOFOL N/A 09/30/2018   Procedure: ESOPHAGOGASTRODUODENOSCOPY (EGD)  WITH PROPOFOL;  Surgeon: Virgel Manifold, MD;  Location: [facility] ENDOSCOPY;  Service: Endoscopy;  Laterality: N/A;   SIGMOIDOSCOPY  05/26/10    Home Medications:  Allergies as of 10/29/2020       Reactions   Coffee Bean Extract  [coffea Arabica] Nausea Only   Other    Outdoor allergies    Trazodone And Nefazodone Other (See Comments)   irritability        Medication List        Accurate as of October 29, 2020  3:25 PM. If you have any questions, ask your nurse or doctor.          STOP taking these medications    azithromycin 200 MG/5ML suspension Commonly known as: ZITHROMAX Stopped by: Hollice Espy, MD   benzonatate 100 MG capsule Commonly known as: TESSALON Stopped by: Hollice Espy, MD   methylPREDNISolone 4 MG Tbpk tablet Commonly known as: MEDROL DOSEPAK Stopped by: Hollice Espy, MD   terbinafine 250 MG tablet Commonly known as: LamISIL Stopped by: Hollice Espy, MD       TAKE these medications    albuterol 108 (90 Base) MCG/ACT inhaler Commonly known as: VENTOLIN HFA Inhale 2 puffs into the lungs every 4 (four) hours as needed for wheezing or shortness of breath.   amitriptyline 50 MG tablet Commonly known as: ELAVIL Take 1 tablet (50 mg total) by mouth at bedtime.   diclofenac sodium 1 % Gel Commonly  known as: VOLTAREN Apply 4 g topically 3 (three) times daily as needed.   divalproex 500 MG DR tablet Commonly known as: DEPAKOTE Take 1 tablet (500 mg total) by mouth daily.   DULoxetine 30 MG capsule Commonly known as: CYMBALTA TAKE THREE CAPSULES DAILY   EPINEPHrine 0.3 mg/0.3 mL Soaj injection Commonly known as: EPI-PEN Inject into the muscle.   FLOVENT IN Inhale into the lungs as needed.   fluticasone 50 MCG/ACT nasal spray Commonly known as: FLONASE Place 2 sprays into both nostrils daily.   gentamicin cream 0.1 % Commonly known as: GARAMYCIN Apply 1 application topically as needed (for nose).   ketoconazole 2 %  cream Commonly known as: NIZORAL Apply to crease of nose twice daily as needed for flake and itch.   loratadine 10 MG tablet Commonly known as: CLARITIN TAKE 1 TABLET (10 MG TOTAL) BY MOUTH DAILY.   omeprazole 20 MG capsule Commonly known as: PRILOSEC TAKE 1 CAPSULE BY MOUTH ONCE DAILY   ondansetron 4 MG disintegrating tablet Commonly known as: ZOFRAN-ODT Take 1 tablet (4 mg total) by mouth every 8 (eight) hours as needed for nausea.   valACYclovir 500 MG tablet Commonly known as: VALTREX TAKE 1 TABLET BY MOUTH DAILY   VITAMIN D PO Take 5,000 Int'l Units by mouth once a week.   zolpidem 6.25 MG CR tablet Commonly known as: AMBIEN CR TAKE 1 TABLET BY MOUTH AT BEDTIME IF NEEDED FOR SLEEP        Allergies:  Allergies  Allergen Reactions   Coffee Bean Extract  [Coffea Arabica] Nausea Only   Other     Outdoor allergies    Trazodone And Nefazodone Other (See Comments)    irritability    Family History: Family History  Problem Relation Age of Onset   Diabetes Maternal Grandmother    Arthritis Maternal Grandmother    Kidney disease Maternal Grandmother    Hypertension Father    Hyperlipidemia Father    Fibromyalgia Mother    Migraines Mother    Cancer Mother 39       breast   Breast cancer Mother    Ehlers-Danlos syndrome Sister    Fibromyalgia Sister    Thyroid cancer Sister    Heart attack Paternal Grandfather    Cancer Paternal Grandfather        Pancreatic/ Bladder Cancer/prostate   Arthritis Paternal Grandfather    Heart disease Paternal Grandfather    Cancer Paternal Grandmother        Liver cancer/lung cancer   Diabetes Paternal Grandmother    Heart attack Maternal Grandfather    Heart disease Maternal Grandfather    Hypertension Maternal Grandfather    Mental illness Maternal Grandfather    Ovarian cancer Neg Hx    Colon cancer Neg Hx     Social History:  reports that she has never smoked. She has never used smokeless tobacco. She reports  current alcohol use. She reports that she does not use drugs.   Physical Exam: BP 138/81   Pulse 80   Ht '5\' 4"'$  (1.626 m)   Wt 200 lb (90.7 kg)   BMI 34.33 kg/m   Constitutional:  Alert and oriented, No acute distress. HEENT: [place] AT, moist mucus membranes.  Trachea midline, no masses. Cardiovascular: No clubbing, cyanosis, or edema. Respiratory: Normal respiratory effort, no increased work of breathing. Skin: No rashes, bruises or suspicious lesions. Neurologic: Grossly intact, no focal deficits, moving all 4 extremities. Psychiatric: Normal mood and affect.  Laboratory Data:  Lab Results  Component Value Date   CREATININE 0.98 11/27/2019     Lab Results  Component Value Date   HGBA1C 5.7 (H) 12/27/2019    Urinalysis  - Urine today  negative    Assessment & Plan:   Microscopic hematuria  -Isolated episode of microscopic hematuria and the absence of infection, however this particular urinalysis had greater than 10 epithelials and looked otherwise contaminated - no further evidence of blood in the urine today which is reassuring - Based on her relatively young age as well as criteria for microscopic hematuria, she does not meet the criteria for hematuria evaluation at this time however given her history of IC, I did offer her cystoscopy for modified evaluation for reassurance.  She opted out of this options but will schedule an appointment if she feels the need for cystoscopy.   2. History of interstitial colitis - no recent exacerbations  - managed on medication in the past   F/u as needed  I,Kailey Littlejohn,acting as a scribe for Hollice Espy, MD.,have documented all relevant documentation on the behalf of Hollice Espy, MD,as directed by  Hollice Espy, MD while in the presence of Hollice Espy, MD.  I have reviewed the above documentation for accuracy and completeness, and I agree with the above.   Hollice Espy, MD    [facility] [address] [place], [place] [address] [phone]

## 2020-10-29 ENCOUNTER — Encounter: Payer: Self-pay | Admitting: Urology

## 2020-10-29 ENCOUNTER — Other Ambulatory Visit: Payer: Self-pay

## 2020-10-29 ENCOUNTER — Ambulatory Visit: Payer: Managed Care, Other (non HMO) | Admitting: Urology

## 2020-10-29 VITALS — BP 138/81 | HR 80 | Ht 64.0 in | Wt 200.0 lb

## 2020-10-29 DIAGNOSIS — N3010 Interstitial cystitis (chronic) without hematuria: Secondary | ICD-10-CM

## 2020-10-29 DIAGNOSIS — R3129 Other microscopic hematuria: Secondary | ICD-10-CM | POA: Diagnosis not present

## 2020-10-29 NOTE — Telephone Encounter (Signed)
Last Filled 09/27/20 last OV 08/30/20 next appt 1021/22

## 2020-10-30 LAB — URINALYSIS, COMPLETE
Bilirubin, UA: NEGATIVE
Glucose, UA: NEGATIVE
Leukocytes,UA: NEGATIVE
Nitrite, UA: NEGATIVE
Protein,UA: NEGATIVE
Specific Gravity, UA: 1.025 (ref 1.005–1.030)
Urobilinogen, Ur: 0.2 mg/dL (ref 0.2–1.0)
pH, UA: 5.5 (ref 5.0–7.5)

## 2020-10-30 LAB — MICROSCOPIC EXAMINATION: Bacteria, UA: NONE SEEN

## 2020-10-31 ENCOUNTER — Ambulatory Visit: Payer: Managed Care, Other (non HMO) | Admitting: Rheumatology

## 2020-10-31 DIAGNOSIS — M170 Bilateral primary osteoarthritis of knee: Secondary | ICD-10-CM

## 2020-10-31 DIAGNOSIS — I498 Other specified cardiac arrhythmias: Secondary | ICD-10-CM

## 2020-10-31 DIAGNOSIS — Z8669 Personal history of other diseases of the nervous system and sense organs: Secondary | ICD-10-CM

## 2020-10-31 DIAGNOSIS — K588 Other irritable bowel syndrome: Secondary | ICD-10-CM

## 2020-10-31 DIAGNOSIS — R5383 Other fatigue: Secondary | ICD-10-CM

## 2020-10-31 DIAGNOSIS — Z8659 Personal history of other mental and behavioral disorders: Secondary | ICD-10-CM

## 2020-10-31 DIAGNOSIS — M722 Plantar fascial fibromatosis: Secondary | ICD-10-CM

## 2020-10-31 DIAGNOSIS — Z87440 Personal history of urinary (tract) infections: Secondary | ICD-10-CM

## 2020-10-31 DIAGNOSIS — M62838 Other muscle spasm: Secondary | ICD-10-CM

## 2020-10-31 DIAGNOSIS — F5104 Psychophysiologic insomnia: Secondary | ICD-10-CM

## 2020-10-31 DIAGNOSIS — M797 Fibromyalgia: Secondary | ICD-10-CM

## 2020-10-31 DIAGNOSIS — G2581 Restless legs syndrome: Secondary | ICD-10-CM

## 2020-10-31 DIAGNOSIS — M7062 Trochanteric bursitis, left hip: Secondary | ICD-10-CM

## 2020-11-06 ENCOUNTER — Encounter: Payer: Self-pay | Admitting: Family Medicine
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
## 2020-11-26 ENCOUNTER — Ambulatory Visit: Payer: Managed Care, Other (non HMO) | Admitting: Rheumatology

## 2020-11-26 ENCOUNTER — Other Ambulatory Visit: Payer: Self-pay

## 2020-11-26 ENCOUNTER — Encounter: Payer: Self-pay | Admitting: Rheumatology

## 2020-11-26 VITALS — BP 128/81 | HR 80 | Ht 64.25 in | Wt 203.4 lb

## 2020-11-26 DIAGNOSIS — F5104 Psychophysiologic insomnia: Secondary | ICD-10-CM | POA: Diagnosis not present

## 2020-11-26 DIAGNOSIS — R5383 Other fatigue: Secondary | ICD-10-CM | POA: Diagnosis not present

## 2020-11-26 DIAGNOSIS — M7062 Trochanteric bursitis, left hip: Secondary | ICD-10-CM

## 2020-11-26 DIAGNOSIS — Z87440 Personal history of urinary (tract) infections: Secondary | ICD-10-CM

## 2020-11-26 DIAGNOSIS — M797 Fibromyalgia: Secondary | ICD-10-CM | POA: Diagnosis not present

## 2020-11-26 DIAGNOSIS — G90A Postural orthostatic tachycardia syndrome (POTS): Secondary | ICD-10-CM

## 2020-11-26 DIAGNOSIS — I498 Other specified cardiac arrhythmias: Secondary | ICD-10-CM

## 2020-11-26 DIAGNOSIS — Z8669 Personal history of other diseases of the nervous system and sense organs: Secondary | ICD-10-CM

## 2020-11-26 DIAGNOSIS — G2581 Restless legs syndrome: Secondary | ICD-10-CM

## 2020-11-26 DIAGNOSIS — M170 Bilateral primary osteoarthritis of knee: Secondary | ICD-10-CM

## 2020-11-26 DIAGNOSIS — M62838 Other muscle spasm: Secondary | ICD-10-CM

## 2020-11-26 DIAGNOSIS — Z8659 Personal history of other mental and behavioral disorders: Secondary | ICD-10-CM

## 2020-11-26 DIAGNOSIS — K588 Other irritable bowel syndrome: Secondary | ICD-10-CM

## 2020-11-26 DIAGNOSIS — M722 Plantar fascial fibromatosis: Secondary | ICD-10-CM

## 2020-11-26 MED ORDER — TRIAMCINOLONE ACETONIDE 40 MG/ML IJ SUSP
10.0000 mg | INTRAMUSCULAR | Status: AC | PRN
Start: 2020-11-26 — End: 2020-11-26
  Administered 2020-11-26: 10 mg via INTRAMUSCULAR

## 2020-11-26 MED ORDER — TRIAMCINOLONE ACETONIDE 40 MG/ML IJ SUSP
40.0000 mg | INTRAMUSCULAR | Status: AC | PRN
  Administered 2020-11-26: 40 mg via INTRA_ARTICULAR

## 2020-11-26 MED ORDER — LIDOCAINE HCL 1 % IJ SOLN
1.5000 mL | INTRAMUSCULAR | Status: AC | PRN
  Administered 2020-11-26: 1.5 mL

## 2020-11-26 MED ORDER — LIDOCAINE HCL 1 % IJ SOLN
0.5000 mL | INTRAMUSCULAR | Status: AC | PRN
  Administered 2020-11-26: .5 mL

## 2020-11-26 NOTE — Patient Instructions (Signed)
Iliotibial Band Syndrome Rehab Ask your health care provider which exercises are safe for you. Do exercises exactly as told by your health care provider and adjust them as directed. It is normal to feel mild stretching, pulling, tightness, or discomfort as you do these exercises. Stop right away if you feel sudden pain or your pain gets significantly worse. Do not begin these exercises until told by your health care provider. Stretching and range-of-motion exercises These exercises warm up your muscles and joints and improve the movement andflexibility of your hip and pelvis. Quadriceps stretch, prone  Lie on your abdomen (prone position) on a firm surface, such as a bed or padded floor. Bend your left / right knee and reach back to hold your ankle or pant leg. If you cannot reach your ankle or pant leg, loop a belt around your foot and grab the belt instead. Gently pull your heel toward your buttocks. Your knee should not slide out to the side. You should feel a stretch in the front of your thigh and knee (quadriceps). Hold this position for __________ seconds. Repeat __________ times. Complete this exercise __________ times a day. Iliotibial band stretch An iliotibial band is a strong band of muscle tissue that runs from the outer side of your hip to the outer side of your thigh and knee. Lie on your side with your left / right leg in the top position. Bend both of your knees and grab your left / right ankle. Stretch out your bottom arm to help you balance. Slowly bring your top knee back so your thigh goes behind your trunk. Slowly lower your top leg toward the floor until you feel a gentle stretch on the outside of your left / right hip and thigh. If you do not feel a stretch and your knee will not fall farther, place the heel of your other foot on top of your knee and pull your knee down toward the floor with your foot. Hold this position for __________ seconds. Repeat __________ times.  Complete this exercise __________ times a day. Strengthening exercises These exercises build strength and endurance in your hip and pelvis. Enduranceis the ability to use your muscles for a long time, even after they get tired. Straight leg raises, side-lying This exercise strengthens the muscles that rotate the leg at the hip and move it away from your body (hip abductors). Lie on your side with your left / right leg in the top position. Lie so your head, shoulder, hip, and knee line up. You may bend your bottom knee to help you balance. Roll your hips slightly forward so your hips are stacked directly over each other and your left / right knee is facing forward. Tense the muscles in your outer thigh and lift your top leg 4-6 inches (10-15 cm). Hold this position for __________ seconds. Slowly lower your leg to return to the starting position. Let your muscles relax completely before doing another repetition. Repeat __________ times. Complete this exercise __________ times a day. Leg raises, prone This exercise strengthens the muscles that move the hips backward (hip extensors). Lie on your abdomen (prone position) on your bed or a firm surface. You can put a pillow under your hips if that is more comfortable for your lower back. Bend your left / right knee so your foot is straight up in the air. Squeeze your buttocks muscles and lift your left / right thigh off the bed. Do not let your back arch. Tense your thigh   muscle as hard as you can without increasing any knee pain. Hold this position for __________ seconds. Slowly lower your leg to return to the starting position and allow it to relax completely. Repeat __________ times. Complete this exercise __________ times a day. Hip hike Stand sideways on a bottom step. Stand on your left / right leg with your other foot unsupported next to the step. You can hold on to a railing or wall for balance if needed. Keep your knees straight and your  torso square. Then lift your left / right hip up toward the ceiling. Slowly let your left / right hip lower toward the floor, past the starting position. Your foot should get closer to the floor. Do not lean or bend your knees. Repeat __________ times. Complete this exercise __________ times a day. This information is not intended to replace advice given to you by your health care provider. Make sure you discuss any questions you have with your healthcare provider. Document Revised: 04/12/2019 Document Reviewed: 04/12/2019 Elsevier Patient Education  2022 [employer] Cervical Strain and Sprain Rehab Ask your health care provider which exercises are safe for you. Do exercises exactly as told by your health care provider and adjust them as directed. It is normal to feel mild stretching, pulling, tightness, or discomfort as you do these exercises. Stop right away if you feel sudden pain or your pain gets worse. Do not begin these exercises until told by your health care provider. Stretching and range-of-motion exercises Cervical side bending  Using good posture, sit on a stable chair or stand up. Without moving your shoulders, slowly tilt your left / right ear to your shoulder until you feel a stretch in the opposite side neck muscles. You should be looking straight ahead. Hold for __________ seconds. Repeat with the other side of your neck. Repeat __________ times. Complete this exercise __________ times a day. Cervical rotation  Using good posture, sit on a stable chair or stand up. Slowly turn your head to the side as if you are looking over your left / right shoulder. Keep your eyes level with the ground. Stop when you feel a stretch along the side and the back of your neck. Hold for __________ seconds. Repeat this by turning to your other side. Repeat __________ times. Complete this exercise __________ times a day. Thoracic extension and pectoral stretch Roll a towel or a small blanket  so it is about 4 inches (10 cm) in diameter. Lie down on your back on a firm surface. Put the towel lengthwise, under your spine in the middle of your back. It should not be under your shoulder blades. The towel should line up with your spine from your middle back to your lower back. Put your hands behind your head and let your elbows fall out to your sides. Hold for __________ seconds. Repeat __________ times. Complete this exercise __________ times a day. Strengthening exercises Isometric upper cervical flexion Lie on your back with a thin pillow behind your head and a small rolled-up towel under your neck. Gently tuck your chin toward your chest and nod your head down to look toward your feet. Do not lift your head off the pillow. Hold for __________ seconds. Release the tension slowly. Relax your neck muscles completely before you repeat this exercise. Repeat __________ times. Complete this exercise __________ times a day. Isometric cervical extension  Stand about 6 inches (15 cm) away from a wall, with your back facing the wall. Place a soft   object, about 6-8 inches (15-20 cm) in diameter, between the back of your head and the wall. A soft object could be a small pillow, a ball, or a folded towel. Gently tilt your head back and press into the soft object. Keep your jaw and forehead relaxed. Hold for __________ seconds. Release the tension slowly. Relax your neck muscles completely before you repeat this exercise. Repeat __________ times. Complete this exercise __________ times a day. Posture and body mechanics Body mechanics refers to the movements and positions of your body while you do your daily activities. Posture is part of body mechanics. Good posture and healthy body mechanics can help to relieve stress in your body's tissues and joints. Good posture means that your spine is in its natural S-curve position (your spine is neutral), your shoulders are pulled back slightly, and your  head is not tipped forward. The following are general guidelines for applying improved posture andbody mechanics to your everyday activities. Sitting  When sitting, keep your spine neutral and keep your feet flat on the floor. Use a footrest, if necessary, and keep your thighs parallel to the floor. Avoid rounding your shoulders, and avoid tilting your head forward. When working at a desk or a computer, keep your desk at a height where your hands are slightly lower than your elbows. Slide your chair under your desk so you are close enough to maintain good posture. When working at a computer, place your monitor at a height where you are looking straight ahead and you do not have to tilt your head forward or downward to look at the screen.  Standing  When standing, keep your spine neutral and keep your feet about hip-width apart. Keep a slight bend in your knees. Your ears, shoulders, and hips should line up. When you do a task in which you stand in one place for a long time, place one foot up on a stable object that is 2-4 inches (5-10 cm) high, such as a footstool. This helps keep your spine neutral.  Resting When lying down and resting, avoid positions that are most painful for you. Try to support your neck in a neutral position. You can use a contour pillow or asmall rolled-up towel. Your pillow should support your neck but not push on it. This information is not intended to replace advice given to you by your health care provider. Make sure you discuss any questions you have with your healthcare provider. Document Revised: 05/25/2018 Document Reviewed: 11/03/2017 Elsevier Patient Education  2022 [employer]  

## 2020-11-27 ENCOUNTER — Other Ambulatory Visit: Payer: Self-pay | Admitting: Neurology

## 2020-11-27 ENCOUNTER — Telehealth: Payer: Managed Care, Other (non HMO) | Admitting: Neurology

## 2020-11-27 ENCOUNTER — Other Ambulatory Visit: Payer: Self-pay | Admitting: Family Medicine

## 2020-11-27 DIAGNOSIS — F5104 Psychophysiologic insomnia: Secondary | ICD-10-CM

## 2020-11-27 DIAGNOSIS — G43009 Migraine without aura, not intractable, without status migrainosus: Secondary | ICD-10-CM

## 2020-11-27 NOTE — Progress Notes (Signed)
[facility]    Provider:  Dr Jaynee Eagles Referring Provider: Leone Haven, MD Primary Care Physician:  Leone Haven, MD  CC:  Headaches and migraines  Virtual Visit via Video Note  I connected with [name] on 11/30/20 at  1:00 PM EDT by a video enabled telemedicine application and verified that I am speaking with the correct person using two identifiers.  Location: Patient: home Provider: office   I discussed the limitations of evaluation and management by telemedicine and the availability of in person appointments. The patient expressed understanding and agreed to proceed.   Follow Up Instructions:    I discussed the assessment and treatment plan with the patient. The patient was provided an opportunity to ask questions and all were answered. The patient agreed with the plan and demonstrated an understanding of the instructions.   The patient was advised to call back or seek an in-person evaluation if the symptoms worsen or if the condition fails to improve as anticipated.  I provided over 10 minutes of non-face-to-face time during this encounter.   Margaret Beam, MD   Interval history 11/27/2019:  She is doing well, she is working from home, she moved into her new home in January this year. When she has the migraines she starts with excedrin and then she goes to the [place]. Otherwise perfectly stable  Interval history 11/28/2018: She is here for follow up of migranies. Migraines have been better. She has switched jobs. She is building a house. Switched jobs, she is at [employer] are better. Only one a month and cambia helps.    Interval history: Tried botox and had reactions(difficulty swallowing, shortness of breath, sore throat, pulse in the 100's , cough), tried Depakote in the past (she cannot get pregnant), She has used onzetra in the past and worked well and cambia works well.  She has a new job. She is still on  Depakote. Migraines are still ongoing. She would like a screen protector note for her computer bc it is causing her migraines. Her migraines are improved. 4 migraine days a month treated with excedrin and sometimes cambia. Continue the Cambia and try to get the [place]. Refill medications.   HPI:  Margaret Silva is a 40 y.o. female here as a referral from Dr. Caryl Bis for headaches. Past medical history migraines, fibromyalgia, interstitial cystitis, diverticulitis, endometriosis, ADHD, dyslexia, restless legs, bursitis, plantar fasciitis, IBS. Migraines Since a teenager. She is on Cymbalta which helps. She works around a lot of dyes which have exacerbated her migraines. Migraines are on the right side. It is sharp, pounding, going up the neck on the back, sometimes the other side. Lots of sound and light sensitivity. Lots of nausea. Occurs several times a week, lasts all day. She has headaches every day, 30 headache days a month, At least 8 are bad migraine days. Has tried TCA, verapamil,propanolol. She is in the lab now which may be better as less triggers. Cymbalta was just increased. She takes Tylenol 3-4x a week only, no medication overuse . Shutting herslef off in a dark room helps. No aura. Computer overuse makes it worse. She takes nothing for the nausea. Sumatriptan used to work not anymore. Zomig nasal and the cambia helped. Sometimes the migraines are immediate but usually a more gradual increase in severity. The low-level headaches are usually as she go to work every day. The headaches are at the base of the skull, all day long, 1-2 level, She does  not take the robaxin. No other focal neurologic deficits or complaints.  Currently: cymbalta, imitrex, on Depakote, topamax.  Has tried verapamil, propranolol, amitriptyline in the past. She has also tried naproxen, ibuprofen, indomethacin.  Reviewed notes, labs and imaging from outside physicians, which showed: Reviewed notes from [facility]  orthopedics rheumatology. Patient has fibromyalgia syndrome. Fibromyalgia has been active. Her generalized pain scale of 0-10 has been about 6-8 in her fatigue has been about 7. She has plantar fasciitis in her left foot going for a few months now. She had cortisone injections, podiatrist. She also had physical therapy which she has not done yet that she has been doing some splinting at night also some exercises without much result. She went on a mission trip to [place] and since then has been having pain in her left hip and bilateral knee joints without joint swelling. She has a history of morning stiffness, discomfort walking and doing routine activities. She is 16 out of 18 points positive on her fibromyalgia. Her fibromyalgia is active with generalized pain and positive tender points. She has mild osteoarthritis. She is overweight. Weight loss, diet, exercise and muscle strengthening exercises were discussed. IT band exercises and muscle strengthening exercises were discussed. She was referred to physical therapy for plantar fasciitis and left trochanteric bursitis. She also has insomnia, history of IBS, bilateral trapezius muscle spasms.  Reviewed image reports. X-rays of left hip joint 2 views, were within normal limits. X-rays of bilateral knee joints, 2 views, showed mild medial compartment narrowing and some lateral osteophytes with mild patellofemoral narrowing without any chondrocalcinosis.  Reviewed lab reports, CMP was normal with normal creatinine 0.94. This was taken on January 2016.  Cholesterol high T10, LDL elevated at 133, TSH within normal limits, CBC normal.  Review of Systems: Patient complains of symptoms per HPI as well as the following symptoms: none . Pertinent negatives and positives per HPI. All others negative    Social History   Socioeconomic History   Marital status: Divorced    Spouse name: Not on file   Number of children: 0   Years of education: 12    Highest  education level: Not on file  Occupational History   Occupation: [employer]   Tobacco Use   Smoking status: Never   Smokeless tobacco: Never  Vaping Use   Vaping Use: Never used  Substance and Sexual Activity   Alcohol use: Yes    Alcohol/week: 0.0 standard drinks    Comment: occasionally   Drug use: No   Sexual activity: Yes    Partners: Male    Birth control/protection: I.U.D.  Other Topics Concern   Not on file  Social History Narrative   Lives with dog   Caffeine use:  Generally when she has a headache, probably less than  Weekly   Right handed   Social Determinants of Health   Financial Resource Strain: Not on file  Food Insecurity: Not on file  Transportation Needs: Not on file  Physical Activity: Not on file  Stress: Not on file  Social Connections: Not on file  Intimate Partner Violence: Not on file    Family History  Problem Relation Age of Onset   Diabetes Maternal Grandmother    Arthritis Maternal Grandmother    Kidney disease Maternal Grandmother    Hypertension Father    Hyperlipidemia Father    Fibromyalgia Mother    Migraines Mother    Cancer Mother 9       breast  Breast cancer Mother    Ehlers-Danlos syndrome Sister    Fibromyalgia Sister    Thyroid cancer Sister    Heart attack Paternal Grandfather    Cancer Paternal Grandfather        Pancreatic/ Bladder Cancer/prostate   Arthritis Paternal Grandfather    Heart disease Paternal Grandfather    Cancer Paternal Grandmother        Liver cancer/lung cancer   Diabetes Paternal Grandmother    Heart attack Maternal Grandfather    Heart disease Maternal Grandfather    Hypertension Maternal Grandfather    Mental illness Maternal Grandfather    Ovarian cancer Neg Hx    Colon cancer Neg Hx     Past Medical History:  Diagnosis Date   Asthma    Bursitis of left hip    Diverticulitis    h/o   Endometriosis    GERD (gastroesophageal reflux disease)    Headache    History of  chicken pox    History of colonoscopy 11/04 and April 06   History of laparoscopy 12/04 and 12/07   History of laparoscopy 01/29/2006   Dr. 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    Past Surgical History:  Procedure Laterality Date   CYSTOSCOPY WITH HYDRODISTENSION AND BIOPSY     DIAGNOSTIC LAPAROSCOPY  02/16/2003   ESOPHAGOGASTRODUODENOSCOPY (EGD) WITH PROPOFOL N/A 09/30/2018   Procedure: ESOPHAGOGASTRODUODENOSCOPY (EGD) WITH PROPOFOL;  Surgeon: Virgel Manifold, MD;  Location: [facility] ENDOSCOPY;  Service: Endoscopy;  Laterality: N/A;   SIGMOIDOSCOPY  05/26/10    Current Outpatient Medications  Medication Sig Dispense Refill   albuterol (VENTOLIN HFA) 108 (90 Base) MCG/ACT inhaler Inhale 2 puffs into the lungs every 4 (four) hours as needed for wheezing or shortness of breath. 18 each 0   amitriptyline (ELAVIL) 50 MG tablet TAKE ONE TABLET (50 MG) BY MOUTH AT BEDTIME 90 tablet 4   Cholecalciferol (VITAMIN D PO) Take 5,000 Int'l Units by mouth once a week.     diclofenac sodium (VOLTAREN) 1 % GEL Apply 4 g topically 3 (three) times daily as needed.     divalproex (DEPAKOTE) 500 MG DR tablet TAKE ONE TABLET (500 MG) BY MOUTH EVERY DAY 90 tablet 4   DULoxetine (CYMBALTA) 30 MG capsule TAKE THREE CAPSULES DAILY 270 capsule 1   EPINEPHrine 0.3 mg/0.3 mL IJ SOAJ injection Inject into the muscle.     fluticasone (FLONASE) 50 MCG/ACT nasal spray Place 2 sprays into both nostrils daily. 16 g 6   gentamicin cream (GARAMYCIN) 0.1 % Apply 1 application topically as needed (for nose).     ketoconazole (NIZORAL) 2 % cream Apply to crease of nose twice daily as needed for flake and itch. 60 g 3    loratadine (CLARITIN) 10 MG tablet TAKE 1 TABLET (10 MG TOTAL) BY MOUTH DAILY. 90 tablet 3   omeprazole (PRILOSEC) 20 MG capsule TAKE 1 CAPSULE BY MOUTH ONCE DAILY 90 capsule 3   ondansetron (ZOFRAN-ODT) 4 MG disintegrating tablet Take 1 tablet (4 mg total) by mouth every 8 (eight) hours as needed for nausea. 30 tablet 12   valACYclovir (VALTREX) 500 MG tablet TAKE 1 TABLET  BY MOUTH DAILY 90 tablet 1   zolpidem (AMBIEN CR) 6.25 MG CR tablet TAKE 1 TABLET BY MOUTH AT BEDTIME IF NEEDED FOR SLEEP 30 tablet 0   No current facility-administered medications for this visit.    Allergies as of 11/27/2020 - Review Complete 11/26/2020  Allergen Reaction Noted   Coffee bean extract  [coffea arabica] Nausea Only 09/04/2014   Other  08/12/2015   Trazodone and nefazodone Other (See Comments) 08/03/2013    Vitals: There were no vitals taken for this visit. Last Weight:  Wt Readings from Last 1 Encounters:  11/26/20 203 lb 6.4 oz (92.3 kg)   Last Height:   Ht Readings from Last 1 Encounters:  11/26/20 5' 4.25" (1.632 m)    Physical exam: Exam: Gen: NAD, conversant      CV: attempted, Could not perform over Web Video. Denies palpitations or chest pain or SOB. VS: Breathing at a normal rate. Weight appears within normal limits. Not febrile. Eyes: Conjunctivae clear without exudates or hemorrhage  Neuro: Detailed Neurologic Exam  Speech:    Speech is normal; fluent and spontaneous with normal comprehension.  Cognition:    The patient is oriented to person, place, and time;     recent and remote memory intact;     language fluent;     normal attention, concentration,     fund of knowledge Cranial Nerves:    The pupils are equal, round, and reactive to light. Attempted, Cannot perform fundoscopic exam. Visual fields are full to finger confrontation. Extraocular movements are intact.  The face is symmetric with normal sensation. The palate elevates in the midline. Hearing intact. Voice is  normal. Shoulder shrug is normal. The tongue has normal motion without fasciculations.   Coordination:    Normal finger to nose  Gait:    Normal native gait  Motor Observation:   no involuntary movements noted. Tone:    Appears normal  Posture:    Posture is normal. normal erect    Strength:    Strength is anti-gravity and symmetric in the upper and lower limbs.      Sensation: intact to LT     Reflex Exam:  DTR's:    Attempted, Could not perform over Web Video   Toes: Attempted Could not perform over Web Video  Clonus:   Attempted, Could not perform over Web Video          Assessment/Plan:  40 year old with chronic migraines, non-intractable, without status migrainosus, without aura.  Continue current medications  Acute management migraines: cambia, zofran (refill) Preventative: Depakote, Amitriptyline at night, may help with migraines as well as fibromyalgia and insomnia.  Meds ordered this encounter  Medications   ondansetron (ZOFRAN-ODT) 4 MG disintegrating tablet    Sig: Take 1 tablet (4 mg total) by mouth every 8 (eight) hours as needed for nausea.    Dispense:  30 tablet    Refill:  12   divalproex (DEPAKOTE) 500 MG DR tablet    Sig: TAKE ONE TABLET (500 MG) BY MOUTH EVERY DAY    Dispense:  90 tablet    Refill:  4   amitriptyline (ELAVIL) 50 MG tablet    Sig: TAKE ONE TABLET (50 MG) BY MOUTH AT BEDTIME    Dispense:  90 tablet    Refill:  4     To prevent or relieve headaches, try the following: Cool Compress. Lie down and place a cool compress on your head.  Avoid headache triggers. If certain foods or  odors seem to have triggered your migraines in the past, avoid them. A headache diary might help you identify triggers.  Include physical activity in your daily routine. Try a daily walk or other moderate aerobic exercise.  Manage stress. Find healthy ways to cope with the stressors, such as delegating tasks on your to-do list.  Practice relaxation  techniques. Try deep breathing, yoga, massage and visualization.  Eat regularly. Eating regularly scheduled meals and maintaining a healthy diet might help prevent headaches. Also, drink plenty of fluids.  Follow a regular sleep schedule. Sleep deprivation might contribute to headaches Consider biofeedback. With this mind-body technique, you learn to control certain bodily functions -- such as muscle tension, heart rate and blood pressure -- to prevent headaches or reduce headache pain.    Proceed to emergency room if you experience new or worsening symptoms or symptoms do not resolve, if you have new neurologic symptoms or if headache is severe, or for any concerning symptom.   Cc: Dr. Irine Seal, MD  [facility] [address] [place], [place] [address]  Phone [phone] Fax [phone] I spent over 10 minutes of face-to-face and non-face-to-face time with patient on the  1. Migraine without aura and without status migrainosus, not intractable     diagnosis.  This included previsit chart review, lab review, study review, order entry, electronic health record documentation, patient education on the different diagnostic and therapeutic options, counseling and coordination of care, risks and benefits of management, compliance, or risk factor reduction

## 2020-11-27 NOTE — Telephone Encounter (Signed)
Patient will be seen today. Hold on refills in case changes are made to medications.

## 2020-11-27 NOTE — Telephone Encounter (Signed)
RX Refill:ambien Last Seen:08-30-20 Last ordered:10-29-20

## 2020-11-30 ENCOUNTER — Encounter: Payer: Self-pay | Admitting: Neurology

## 2020-11-30 MED ORDER — ONDANSETRON 4 MG PO TBDP: 4.0000 mg | ORAL_TABLET | Freq: Three times a day (TID) | ORAL | 12 refills | Status: DC | PRN

## 2020-11-30 MED ORDER — AMITRIPTYLINE HCL 50 MG PO TABS: ORAL_TABLET | ORAL | 4 refills | Status: DC

## 2020-11-30 MED ORDER — DIVALPROEX SODIUM 500 MG PO DR TAB: DELAYED_RELEASE_TABLET | ORAL | 4 refills | Status: DC

## 2020-11-30 NOTE — Patient Instructions (Signed)
Continue current medications. 

## 2020-12-06 ENCOUNTER — Ambulatory Visit: Payer: Managed Care, Other (non HMO) | Admitting: Family Medicine

## 2020-12-25 ENCOUNTER — Telehealth (INDEPENDENT_AMBULATORY_CARE_PROVIDER_SITE_OTHER): Payer: Managed Care, Other (non HMO) | Admitting: Family Medicine

## 2020-12-25 ENCOUNTER — Other Ambulatory Visit: Payer: Self-pay | Admitting: Family Medicine

## 2020-12-25 DIAGNOSIS — F5104 Psychophysiologic insomnia: Secondary | ICD-10-CM

## 2020-12-25 DIAGNOSIS — J0101 Acute recurrent maxillary sinusitis: Secondary | ICD-10-CM

## 2020-12-25 DIAGNOSIS — F419 Anxiety disorder, unspecified: Secondary | ICD-10-CM

## 2020-12-25 MED ORDER — PREDNISONE 20 MG PO TABS: ORAL_TABLET | ORAL | 0 refills | Status: DC

## 2020-12-25 MED ORDER — AMOXICILLIN-POT CLAVULANATE 875-125 MG PO TABS: 1.0000 | ORAL_TABLET | Freq: Two times a day (BID) | ORAL | 0 refills | Status: DC

## 2020-12-25 MED ORDER — HYDROCODONE BIT-HOMATROP MBR 5-1.5 MG/5ML PO SOLN: 5.0000 mL | Freq: Four times a day (QID) | ORAL | 0 refills | Status: DC | PRN

## 2020-12-25 NOTE — Progress Notes (Signed)
Patient ID: Margaret Silva, female   DOB: 03-01-1980, 40 y.o.   MRN: [id number]   This visit type was conducted due to national recommendations for restrictions regarding the COVID-19 pandemic in an effort to limit this patient's exposure and mitigate transmission in our community.   Virtual Visit via Video Note  I connected with Margaret Silva on 12/25/20 at  5:30 PM EST by a video enabled telemedicine application and verified that I am speaking with the correct person using two identifiers.  Location patient: home Location provider:work or home office Persons participating in the virtual visit: patient, provider  I discussed the limitations of evaluation and management by telemedicine and the availability of in person appointments. The patient expressed understanding and agreed to proceed.   HPI:  Margaret Silva relates onset several days ago of sore throat, sinus congestion, cough.  Cough has been mostly dry.  She has not done COVID testing.  She has seasonal allergies especially with ragweed and has had some recent nasal congestive symptoms and thinks this may have started with allergy symptoms.  She does have progressive maxillary facial pain.  Occasional headaches.  No fever.  She has been prone to sinusitis in the past.  She has had fairly severe cough at night not relieved with Dimetapp   ROS: See pertinent positives and negatives per HPI.  Past Medical History:  Diagnosis Date   Asthma    Bursitis of left hip    Diverticulitis    h/o   Endometriosis    GERD (gastroesophageal reflux disease)    Headache    History of chicken pox    History of colonoscopy 11/04 and April 06   History of laparoscopy 12/04 and 12/07   History of laparoscopy 01/29/2006   Dr. Georga Bora Endometriosis   History of trichomoniasis 09/01/2018   Hx of migraines    Hx: UTI (urinary tract infection)    Interstitial cystitis 01/03/2004   hydrodistilation   Jaundice    h/o   Plantar  fasciitis, bilateral    PONV (postoperative nausea and vomiting)    Pott's disease    Not official but working on getting the diagnosis    Past Surgical History:  Procedure Laterality Date   CYSTOSCOPY WITH HYDRODISTENSION AND BIOPSY     DIAGNOSTIC LAPAROSCOPY  02/16/2003   ESOPHAGOGASTRODUODENOSCOPY (EGD) WITH PROPOFOL N/A 09/30/2018   Procedure: ESOPHAGOGASTRODUODENOSCOPY (EGD) WITH PROPOFOL;  Surgeon: Virgel Manifold, MD;  Location: [facility] ENDOSCOPY;  Service: Endoscopy;  Laterality: N/A;   SIGMOIDOSCOPY  05/26/10    Family History  Problem Relation Age of Onset   Diabetes Maternal Grandmother    Arthritis Maternal Grandmother    Kidney disease Maternal Grandmother    Hypertension Father    Hyperlipidemia Father    Fibromyalgia Mother    Migraines Mother    Cancer Mother 47       breast   Breast cancer Mother    Ehlers-Danlos syndrome Sister    Fibromyalgia Sister    Thyroid cancer Sister    Heart attack Paternal Grandfather    Cancer Paternal Grandfather        Pancreatic/ Bladder Cancer/prostate   Arthritis Paternal Grandfather    Heart disease Paternal Grandfather    Cancer Paternal Grandmother        Liver cancer/lung cancer   Diabetes Paternal Grandmother    Heart attack Maternal Grandfather    Heart disease Maternal Grandfather    Hypertension Maternal Grandfather    Mental illness Maternal Grandfather  Ovarian cancer Neg Hx    Colon cancer Neg Hx     SOCIAL HX: Non-smoker   Current Outpatient Medications:    amoxicillin-clavulanate (AUGMENTIN) 875-125 MG tablet, Take 1 tablet by mouth 2 (two) times daily., Disp: 20 tablet, Rfl: 0   HYDROcodone bit-homatropine (HYCODAN) 5-1.5 MG/5ML syrup, Take 5 mLs by mouth every 6 (six) hours as needed for cough., Disp: 120 mL, Rfl: 0   predniSONE (DELTASONE) 20 MG tablet, Take two tablets by mouth once daily for 5 days., Disp: 10 tablet, Rfl: 0   albuterol (VENTOLIN HFA) 108 (90 Base) MCG/ACT inhaler, Inhale 2 puffs  into the lungs every 4 (four) hours as needed for wheezing or shortness of breath., Disp: 18 each, Rfl: 0   amitriptyline (ELAVIL) 50 MG tablet, TAKE ONE TABLET (50 MG) BY MOUTH AT BEDTIME, Disp: 90 tablet, Rfl: 4   Cholecalciferol (VITAMIN D PO), Take 5,000 Int'l Units by mouth once a week., Disp: , Rfl:    diclofenac sodium (VOLTAREN) 1 % GEL, Apply 4 g topically 3 (three) times daily as needed., Disp: , Rfl:    divalproex (DEPAKOTE) 500 MG DR tablet, TAKE ONE TABLET (500 MG) BY MOUTH EVERY DAY, Disp: 90 tablet, Rfl: 4   DULoxetine (CYMBALTA) 30 MG capsule, TAKE THREE CAPSULES DAILY, Disp: 270 capsule, Rfl: 1   EPINEPHrine 0.3 mg/0.3 mL IJ SOAJ injection, Inject into the muscle., Disp: , Rfl:    fluticasone (FLONASE) 50 MCG/ACT nasal spray, Place 2 sprays into both nostrils daily., Disp: 16 g, Rfl: 6   gentamicin cream (GARAMYCIN) 0.1 %, Apply 1 application topically as needed (for nose)., Disp: , Rfl:    ketoconazole (NIZORAL) 2 % cream, Apply to crease of nose twice daily as needed for flake and itch., Disp: 60 g, Rfl: 3   loratadine (CLARITIN) 10 MG tablet, TAKE 1 TABLET (10 MG TOTAL) BY MOUTH DAILY., Disp: 90 tablet, Rfl: 3   omeprazole (PRILOSEC) 20 MG capsule, TAKE 1 CAPSULE BY MOUTH ONCE DAILY, Disp: 90 capsule, Rfl: 3   ondansetron (ZOFRAN-ODT) 4 MG disintegrating tablet, Take 1 tablet (4 mg total) by mouth every 8 (eight) hours as needed for nausea., Disp: 30 tablet, Rfl: 12   valACYclovir (VALTREX) 500 MG tablet, TAKE 1 TABLET BY MOUTH DAILY, Disp: 90 tablet, Rfl: 1   zolpidem (AMBIEN CR) 6.25 MG CR tablet, TAKE 1 TABLET BY MOUTH AT BEDTIME IF NEEDED FOR SLEEP, Disp: 30 tablet, Rfl: 0  EXAM:  VITALS per patient if applicable:  GENERAL: alert, oriented, appears well and in no acute distress  HEENT: atraumatic, conjunttiva clear, no obvious abnormalities on inspection of external nose and ears  NECK: normal movements of the head and neck  LUNGS: on inspection no signs of  respiratory distress, breathing rate appears normal, no obvious gross SOB, gasping or wheezing  CV: no obvious cyanosis  MS: moves all visible extremities without noticeable abnormality  PSYCH/NEURO: pleasant and cooperative, no obvious depression or anxiety, speech and thought processing grossly intact  ASSESSMENT AND PLAN:  Discussed the following assessment and plan:  Acute sinusitis.  We discussed predictors of bacterial versus viral sinusitis.  Even though her symptoms have not been prolonged she has concerns because of progressive facial pain and difficulties in the past with clearing sinusitis.  -We agreed to start Augmentin 875 mg twice daily for 10 days -Prednisone 20 mg take 2 tablets once daily for 5 days -Hycodan cough syrup 1 teaspoon every 6 hours as needed for severe cough -Follow-up  with primary provider for any persistent or worsening symptoms     I discussed the assessment and treatment plan with the patient. The patient was provided an opportunity to ask questions and all were answered. The patient agreed with the plan and demonstrated an understanding of the instructions.   The patient was advised to call back or seek an in-person evaluation if the symptoms worsen or if the condition fails to improve as anticipated.     Carolann Littler, MD

## 2021-01-15 ENCOUNTER — Other Ambulatory Visit: Payer: Self-pay

## 2021-01-15 ENCOUNTER — Encounter: Payer: Self-pay | Admitting: Family Medicine

## 2021-01-15 ENCOUNTER — Telehealth (INDEPENDENT_AMBULATORY_CARE_PROVIDER_SITE_OTHER): Payer: Managed Care, Other (non HMO) | Admitting: Family Medicine

## 2021-01-15 DIAGNOSIS — F32A Depression, unspecified: Secondary | ICD-10-CM

## 2021-01-15 DIAGNOSIS — F419 Anxiety disorder, unspecified: Secondary | ICD-10-CM

## 2021-01-15 DIAGNOSIS — F5104 Psychophysiologic insomnia: Secondary | ICD-10-CM | POA: Diagnosis not present

## 2021-01-15 DIAGNOSIS — E669 Obesity, unspecified: Secondary | ICD-10-CM | POA: Diagnosis not present

## 2021-01-15 MED ORDER — ZOLPIDEM TARTRATE ER 6.25 MG PO TBCR: EXTENDED_RELEASE_TABLET | ORAL | 0 refills | Status: DC

## 2021-01-15 NOTE — Assessment & Plan Note (Signed)
Generally stable.  She will continue to see her new therapist.  She will continue Cymbalta 90 mg daily and amitriptyline 50 mg daily.

## 2021-01-15 NOTE — Assessment & Plan Note (Signed)
I encouraged continued healthy exercise.  Discussed that I typically do not recommend the keto diet as oftentimes causes peoples cholesterol to increase and any weight loss that occurs typically comes back when people go off of this diet.  Discussed trying to eat lean meats as well as plenty of vegetables and reducing carbohydrate intake if she wants to do something similar to keto.

## 2021-01-15 NOTE — Progress Notes (Signed)
Virtual Visit via video Note  This visit type was conducted due to national recommendations for restrictions regarding the COVID-19 pandemic (e.g. social distancing).  This format is felt to be most appropriate for this patient at this time.  All issues noted in this document were discussed and addressed.  No physical exam was performed (except for noted visual exam findings with Video Visits).   I connected with Margaret Silva today at 11:00 AM EST by a video enabled telemedicine application and verified that I am speaking with the correct person using two identifiers. Location patient: home Location provider: home office Persons participating in the virtual visit: patient, provider  I discussed the limitations, risks, security and privacy concerns of performing an evaluation and management service by telephone and the availability of in person appointments. I also discussed with the patient that there may be a patient responsible charge related to this service. The patient expressed understanding and agreed to proceed.  Reason for visit: Follow-up.  HPI: Anxiety/depression: Patient notes this is mostly stable.  She is seeing a new counselor and likes them.  She has been on Elavil and Cymbalta.  No SI or HI.  Insomnia: This is generally stable.  She continues on Ambien.  She gets about 8.5 hours of sleep nightly.  No drowsiness the next day with the Ambien.  Obesity: She is doing exercise videos 30 minutes at a time 5 days a week.  She is going for a more keto diet.  She is eating lots of meat and cheese.  Drinking some V8 juice.  Reports she was recently sick with sinusitis.  She completed a virtual visit and completed treatment.  She notes she is feeling better.   ROS: See pertinent positives and negatives per HPI.  Past Medical History:  Diagnosis Date   Asthma    Bursitis of left hip    Diverticulitis    h/o   Endometriosis    GERD (gastroesophageal reflux disease)     Headache    History of chicken pox    History of colonoscopy 11/04 and April 06   History of laparoscopy 12/04 and 12/07   History of laparoscopy 01/29/2006   Dr. 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    Past Surgical History:  Procedure Laterality Date   CYSTOSCOPY WITH HYDRODISTENSION AND BIOPSY     DIAGNOSTIC LAPAROSCOPY  02/16/2003   ESOPHAGOGASTRODUODENOSCOPY (EGD) WITH PROPOFOL N/A 09/30/2018   Procedure: ESOPHAGOGASTRODUODENOSCOPY (EGD) WITH PROPOFOL;  Surgeon: Virgel Manifold, MD;  Location: [facility] ENDOSCOPY;  Service: Endoscopy;  Laterality: N/A;   SIGMOIDOSCOPY  05/26/10    Family History  Problem Relation Age of Onset   Diabetes Maternal Grandmother    Arthritis Maternal Grandmother    Kidney disease Maternal Grandmother    Hypertension Father    Hyperlipidemia Father    Fibromyalgia Mother    Migraines Mother    Cancer Mother 72       breast   Breast cancer Mother    Ehlers-Danlos syndrome Sister    Fibromyalgia Sister    Thyroid cancer Sister    Heart attack Paternal Grandfather    Cancer Paternal  Grandfather        Pancreatic/ Bladder Cancer/prostate   Arthritis Paternal Grandfather    Heart disease Paternal Grandfather    Cancer Paternal Grandmother        Liver cancer/lung cancer   Diabetes Paternal Grandmother    Heart attack Maternal Grandfather    Heart disease Maternal Grandfather    Hypertension Maternal Grandfather    Mental illness Maternal Grandfather    Ovarian cancer Neg Hx    Colon cancer Neg Hx     SOCIAL HX: Non-smoker   Current Outpatient Medications:    albuterol (VENTOLIN HFA) 108 (90 Base) MCG/ACT inhaler, Inhale 2 puffs  into the lungs every 4 (four) hours as needed for wheezing or shortness of breath., Disp: 18 each, Rfl: 0   amitriptyline (ELAVIL) 50 MG tablet, TAKE ONE TABLET (50 MG) BY MOUTH AT BEDTIME, Disp: 90 tablet, Rfl: 4   amoxicillin-clavulanate (AUGMENTIN) 875-125 MG tablet, Take 1 tablet by mouth 2 (two) times daily., Disp: 20 tablet, Rfl: 0   Cholecalciferol (VITAMIN D PO), Take 5,000 Int'l Units by mouth once a week., Disp: , Rfl:    diclofenac sodium (VOLTAREN) 1 % GEL, Apply 4 g topically 3 (three) times daily as needed., Disp: , Rfl:    divalproex (DEPAKOTE) 500 MG DR tablet, TAKE ONE TABLET (500 MG) BY MOUTH EVERY DAY, Disp: 90 tablet, Rfl: 4   DULoxetine (CYMBALTA) 30 MG capsule, TAKE THREE CAPSULES DAILY, Disp: 270 capsule, Rfl: 1   EPINEPHrine 0.3 mg/0.3 mL IJ SOAJ injection, Inject into the muscle., Disp: , Rfl:    fluticasone (FLONASE) 50 MCG/ACT nasal spray, Place 2 sprays into both nostrils daily., Disp: 16 g, Rfl: 6   gentamicin cream (GARAMYCIN) 0.1 %, Apply 1 application topically as needed (for nose)., Disp: , Rfl:    HYDROcodone bit-homatropine (HYCODAN) 5-1.5 MG/5ML syrup, Take 5 mLs by mouth every 6 (six) hours as needed for cough., Disp: 120 mL, Rfl: 0   ketoconazole (NIZORAL) 2 % cream, Apply to crease of nose twice daily as needed for flake and itch., Disp: 60 g, Rfl: 3   loratadine (CLARITIN) 10 MG tablet, TAKE 1 TABLET (10 MG TOTAL) BY MOUTH DAILY., Disp: 90 tablet, Rfl: 3   omeprazole (PRILOSEC) 20 MG capsule, TAKE 1 CAPSULE BY MOUTH ONCE DAILY, Disp: 90 capsule, Rfl: 3   ondansetron (ZOFRAN-ODT) 4 MG disintegrating tablet, Take 1 tablet (4 mg total) by mouth every 8 (eight) hours as needed for nausea., Disp: 30 tablet, Rfl: 12   predniSONE (DELTASONE) 20 MG tablet, Take two tablets by mouth once daily for 5 days., Disp: 10 tablet, Rfl: 0   valACYclovir (VALTREX) 500 MG tablet, TAKE 1 TABLET BY MOUTH DAILY, Disp: 90 tablet, Rfl: 1   [START ON 01/24/2021] zolpidem (AMBIEN CR)  6.25 MG CR tablet, TAKE 1 TABLET BY MOUTH AT BEDTIME IF NEEDED FOR SLEEP, Disp: 30 tablet, Rfl: 0  EXAM:  VITALS per patient if applicable:  GENERAL: alert, oriented, appears well and in no acute distress  HEENT: atraumatic, conjunttiva clear, no obvious abnormalities on inspection of external nose and ears  NECK: normal movements of the head and neck  LUNGS: on inspection no signs of respiratory distress, breathing rate appears normal, no obvious gross SOB, gasping or wheezing  CV: no obvious cyanosis  MS: moves all visible extremities without noticeable abnormality  PSYCH/NEURO: pleasant and cooperative, no obvious depression or anxiety, speech and thought processing grossly intact  ASSESSMENT AND PLAN:  Discussed the following assessment  and plan:  Problem List Items Addressed This Visit     Anxiety and depression    Generally stable.  She will continue to see her new therapist.  She will continue Cymbalta 90 mg daily and amitriptyline 50 mg daily.      Chronic insomnia    Chronic and stable.  She can continue Ambien 1 tablet nightly.      Relevant Medications   zolpidem (AMBIEN CR) 6.25 MG CR tablet (Start on 01/24/2021)   Obesity (BMI 30.0-34.9)    I encouraged continued healthy exercise.  Discussed that I typically do not recommend the keto diet as oftentimes causes peoples cholesterol to increase and any weight loss that occurs typically comes back when people go off of this diet.  Discussed trying to eat lean meats as well as plenty of vegetables and reducing carbohydrate intake if she wants to do something similar to keto.      Sinusitis: The patient has recovered from this.  Return in about 3 months (around 04/15/2021).   I discussed the assessment and treatment plan with the patient. The patient was provided an opportunity to ask questions and all were answered. The patient agreed with the plan and demonstrated an understanding of the instructions.   The  patient was advised to call back or seek an in-person evaluation if the symptoms worsen or if the condition fails to improve as anticipated.   Tommi Rumps, MD

## 2021-01-15 NOTE — Assessment & Plan Note (Signed)
Chronic and stable.  She can continue Ambien 1 tablet nightly.

## 2021-02-19 ENCOUNTER — Encounter: Payer: Self-pay | Admitting: Family Medicine

## 2021-02-19 NOTE — Telephone Encounter (Signed)
Patient called in stated returning call, Please call patient @ [phone]

## 2021-02-19 NOTE — Telephone Encounter (Signed)
Left message to call office

## 2021-02-20 ENCOUNTER — Telehealth (INDEPENDENT_AMBULATORY_CARE_PROVIDER_SITE_OTHER): Payer: Managed Care, Other (non HMO) | Admitting: Physician Assistant

## 2021-02-20 DIAGNOSIS — U071 COVID-19: Secondary | ICD-10-CM | POA: Diagnosis not present

## 2021-02-20 NOTE — Telephone Encounter (Signed)
I called to schedule the patient for a virtual visit with someone to get her tested and treated, the patien tcalled back and Kramer scheduled her. Armanie Ullmer,cma

## 2021-02-20 NOTE — Telephone Encounter (Signed)
LVM for patient to call back.   Dajanay Northrup,cma  

## 2021-02-20 NOTE — Progress Notes (Signed)
Virtual Visit via Video Note  I connected with  Margaret Silva  on 02/20/21 at  1:30 PM EST by a video enabled telemedicine application and verified that I am speaking with the correct person using two identifiers.  Location: Patient: home Provider: Therapist, music at [place] present: Patient and myself   I discussed the limitations of evaluation and management by telemedicine and the availability of in person appointments. The patient expressed understanding and agreed to proceed.   History of Present Illness:  Chief complaint: Positive COVID-19 - home test positive today 02/20/2021 Symptom onset: Two days ago on 02/18/21 Pertinent positives: Nasal congestion, headache, cough  Pertinent negatives: CP, SOB, fever/ chills, body aches  Treatments tried: Dimetapp, Advil, Tylenol  Vaccine status: She has not had any COVID-19 vaccines  Sick exposure: Sister tested positive for COVID-19 on 02/10/21    Observations/Objective:   Gen: Awake, alert, no acute distress Resp: Breathing is even and non-labored Psych: calm/pleasant demeanor Neuro: Alert and Oriented x 3, + facial symmetry, speech is clear.   Assessment and Plan:  1. COVID-19 Diagnosis confirmed via home antigen test.  Patient is currently having mild symptoms.  We discussed current algorithm recommendations for prescribing outpatient antivirals.  She is not considered at high risk for progression to severe illness, therefore advised against antiviral therapy at this time.  Risks versus benefits discussed.  Advised self-isolation at home for the next 5 days and then masking around others for at least an additional 5 days.  Treat supportively at this time including sleeping prone, deep breathing exercises, pushing fluids, walking every few hours, vitamins C and D, and Tylenol or ibuprofen as needed.  The patient understands that COVID-19 illness can wax and wane.  Should the symptoms acutely worsen or  patient starts to experience sudden shortness of breath, chest pain, severe weakness, the patient will go straight to the emergency department.  Also advised home pulse oximetry monitoring and for any reading consistently under 92%, should also report to the emergency department.  The patient will continue to keep [place] updated.   Follow Up Instructions:    I discussed the assessment and treatment plan with the patient. The patient was provided an opportunity to ask questions and all were answered. The patient agreed with the plan and demonstrated an understanding of the instructions.   The patient was advised to call back or seek an in-person evaluation if the symptoms worsen or if the condition fails to improve as anticipated.  Milisa Kimbell M Chandra Feger, PA-C

## 2021-02-20 NOTE — Telephone Encounter (Signed)
Pt returning call

## 2021-02-27 ENCOUNTER — Other Ambulatory Visit: Payer: Self-pay | Admitting: Family Medicine

## 2021-02-27 DIAGNOSIS — F5104 Psychophysiologic insomnia: Secondary | ICD-10-CM

## 2021-02-27 NOTE — Telephone Encounter (Signed)
Refilled: 01/24/2021 Last OV: 01/15/2021 Next OV: not scheduled

## 2021-03-28 ENCOUNTER — Other Ambulatory Visit: Payer: Self-pay | Admitting: Family

## 2021-03-28 DIAGNOSIS — F5104 Psychophysiologic insomnia: Secondary | ICD-10-CM

## 2021-04-01 ENCOUNTER — Other Ambulatory Visit: Payer: Self-pay | Admitting: Family Medicine

## 2021-04-01 DIAGNOSIS — F5104 Psychophysiologic insomnia: Secondary | ICD-10-CM

## 2021-04-01 NOTE — Telephone Encounter (Signed)
LOV: 01/15/2021  Last filled on 03/01/21

## 2021-04-03 ENCOUNTER — Other Ambulatory Visit: Payer: Self-pay

## 2021-04-03 ENCOUNTER — Ambulatory Visit
Admission: EM | Admit: 2021-04-03 | Discharge: 2021-04-03 | Disposition: A | Discharge status: Home / Self Care | Payer: Managed Care, Other (non HMO) | Attending: Emergency Medicine | Admitting: Emergency Medicine

## 2021-04-03 ENCOUNTER — Encounter: Payer: Self-pay | Admitting: Emergency Medicine

## 2021-04-03 DIAGNOSIS — M79621 Pain in right upper arm: Secondary | ICD-10-CM | POA: Diagnosis not present

## 2021-04-03 MED ORDER — MELOXICAM 7.5 MG PO TABS: 7.5000 mg | ORAL_TABLET | Freq: Every day | ORAL | 0 refills | Status: AC

## 2021-04-03 NOTE — Discharge Instructions (Addendum)
Take the meloxicam as directed.  Follow up with your primary care provider or an orthopedist if your symptoms are not improving.

## 2021-04-03 NOTE — ED Triage Notes (Signed)
Pt here with right axilla pain radiating to elbow x 3 months without mechanism of injury and would like to make sure she can still work out.
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
## 2021-05-08 ENCOUNTER — Telehealth: Payer: Self-pay | Admitting: Family Medicine

## 2021-05-08 DIAGNOSIS — F5104 Psychophysiologic insomnia: Secondary | ICD-10-CM

## 2021-05-08 NOTE — Telephone Encounter (Signed)
Send refill to [facility] zolpidem (AMBIEN CR) 6.25 MG CR tablet, ?

## 2021-05-09 MED ORDER — ZOLPIDEM TARTRATE ER 6.25 MG PO TBCR: EXTENDED_RELEASE_TABLET | ORAL | 0 refills | Status: DC

## 2021-05-09 NOTE — Telephone Encounter (Signed)
Sent to pharmacy 
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
## 2021-05-21 ENCOUNTER — Encounter: Payer: Self-pay | Admitting: Family Medicine

## 2021-05-21 ENCOUNTER — Telehealth: Payer: Self-pay

## 2021-05-21 ENCOUNTER — Ambulatory Visit: Payer: Managed Care, Other (non HMO) | Admitting: Family Medicine

## 2021-05-21 DIAGNOSIS — F419 Anxiety disorder, unspecified: Secondary | ICD-10-CM | POA: Diagnosis not present

## 2021-05-21 DIAGNOSIS — F5104 Psychophysiologic insomnia: Secondary | ICD-10-CM

## 2021-05-21 DIAGNOSIS — E669 Obesity, unspecified: Secondary | ICD-10-CM

## 2021-05-21 DIAGNOSIS — N939 Abnormal uterine and vaginal bleeding, unspecified: Secondary | ICD-10-CM | POA: Diagnosis not present

## 2021-05-21 DIAGNOSIS — F32A Depression, unspecified: Secondary | ICD-10-CM

## 2021-05-21 NOTE — Assessment & Plan Note (Signed)
Stable.  She can continue Ambien 6.25 mg nightly. ?

## 2021-05-21 NOTE — Assessment & Plan Note (Signed)
Asymptomatic.  She can continue Cymbalta 90 mg daily.  Discussed the risk of serotonin syndrome in taking this in combination with her amitriptyline.  She will monitor for signs and symptoms of serotonin syndrome and let us know immediately if they occur. ?

## 2021-05-21 NOTE — Telephone Encounter (Signed)
Returning call from answering services regarding appt confirmation. Pt voicemail was not set up.  ?

## 2021-05-21 NOTE — Assessment & Plan Note (Signed)
I encouraged continued healthy exercise.  Discussed adding fruits and vegetables to her diet.  Given her absence of weight loss despite significant calorie restriction and exercise increase we did discuss weight loss medication.  She reports her sister had thyroid cancer though she is unsure which kind.  She will confirm that.  She notes no family or personal history of parathyroid cancer or adrenal gland cancer.  No personal history of pancreatitis or gallbladder issues.  I discussed that injectable medicine such as Mancel Parsons do carry a risk of pancreatitis or gallbladder issues.  Advise she would have to discontinue the medicine and let us know if she develops abdominal pain.  If she had severe abdominal pain on this medication she would need to seek medical attention immediately.  Discussed that medullary thyroid cancer has been seen in rats studies though this does not seem to translate into humans.  Discussed if we are able to start this medication she would need to monitor her thyroid area and if she ever developed any issues in that area she would need to let us know right away.  I will wait on her MyChart message with the type of thyroid cancer her sister had prior to sending the medication in. ?

## 2021-05-21 NOTE — Patient Instructions (Signed)
Nice to see you. ?Please find out what type of thyroid cancer your sister had.  The weight loss injections would not be an option if she had medullary thyroid cancer. ?Please continue with diet and exercise. ?

## 2021-05-21 NOTE — Progress Notes (Signed)
?Tommi Rumps, MD ?Phone: [phone] ? ?Margaret Silva is a 41 y.o. female who presents today for follow-up. ? ?Insomnia: Continues on Ambien nightly.  No drowsiness with this.  No odd activities.  She gets 8.5 hours of sleep nightly. ? ?Anxiety/depression: She notes she occasionally feels sad though and denies any significant anxiety or depression.  She is on Cymbalta.  She is also on Elavil for her migraines.  No SI. ? ?Obesity: Patient notes for the last few months she has been doing 30 minutes of high intensity interval training 5 days a week and has cut her calories down to 1400 cal a day.  She eats mostly Pasta and cheese since that is all she can taste after having COVID a year ago.  She does not eat many fruits or vegetables. ? ?Vaginal bleeding: Patient reports she has an IUD in place.  She notes for about a week she has had minimal vaginal bleeding.  She does not typically have a menstrual cycle with the IUD in place.  She is not sexually active.  She has an appointment with GYN in the next couple of months. ? ?Social History  ? ?Tobacco Use  ?Smoking Status Never  ?Smokeless Tobacco Never  ? ? ?Current Outpatient Medications on File Prior to Visit  ?Medication Sig Dispense Refill  ? albuterol (VENTOLIN HFA) 108 (90 Base) MCG/ACT inhaler Inhale 2 puffs into the lungs every 4 (four) hours as needed for wheezing or shortness of breath. 18 each 0  ? amitriptyline (ELAVIL) 50 MG tablet TAKE ONE TABLET (50 MG) BY MOUTH AT BEDTIME 90 tablet 4  ? amoxicillin-clavulanate (AUGMENTIN) 875-125 MG tablet Take 1 tablet by mouth 2 (two) times daily. 20 tablet 0  ? Azelastine-Fluticasone 137-50 MCG/ACT SUSP Place 1 spray into both nostrils 2 (two) times daily.    ? Cholecalciferol (VITAMIN D PO) Take 5,000 Int'l Units by mouth once a week.    ? diclofenac sodium (VOLTAREN) 1 % GEL Apply 4 g topically 3 (three) times daily as needed.    ? divalproex (DEPAKOTE) 500 MG DR tablet TAKE ONE TABLET (500 MG) BY MOUTH  EVERY DAY 90 tablet 4  ? DULoxetine (CYMBALTA) 30 MG capsule TAKE THREE CAPSULES DAILY 270 capsule 1  ? EPINEPHrine 0.3 mg/0.3 mL IJ SOAJ injection Inject into the muscle.    ? fluticasone (FLONASE) 50 MCG/ACT nasal spray Place 2 sprays into both nostrils daily. 16 g 6  ? gentamicin cream (GARAMYCIN) 0.1 % Apply 1 application topically as needed (for nose).    ? HYDROcodone bit-homatropine (HYCODAN) 5-1.5 MG/5ML syrup Take 5 mLs by mouth every 6 (six) hours as needed for cough. 120 mL 0  ? ketoconazole (NIZORAL) 2 % cream Apply to crease of nose twice daily as needed for flake and itch. 60 g 3  ? loratadine (CLARITIN) 10 MG tablet TAKE 1 TABLET (10 MG TOTAL) BY MOUTH DAILY. 90 tablet 3  ? omeprazole (PRILOSEC) 20 MG capsule TAKE 1 CAPSULE BY MOUTH ONCE DAILY 90 capsule 3  ? ondansetron (ZOFRAN-ODT) 4 MG disintegrating tablet Take 1 tablet (4 mg total) by mouth every 8 (eight) hours as needed for nausea. 30 tablet 12  ? predniSONE (DELTASONE) 20 MG tablet Take two tablets by mouth once daily for 5 days. 10 tablet 0  ? valACYclovir (VALTREX) 500 MG tablet TAKE 1 TABLET BY MOUTH DAILY 90 tablet 1  ? zolpidem (AMBIEN CR) 6.25 MG CR tablet TAKE ONE TABLET BY MOUTH AT BEDTIME AS NEEDED  FOR SLEEP. Call to schedule follow-up for future refills. 30 tablet 0  ? ?No current facility-administered medications on file prior to visit.  ? ? ? ?ROS see history of present illness ? ?Objective ? ?Physical Exam ?Vitals:  ? 05/21/21 1035  ?BP: 120/80  ?Pulse: 89  ?Temp: 98.6 ?F (37 ?C)  ?SpO2: 96%  ? ? ?BP Readings from Last 3 Encounters:  ?05/21/21 120/80  ?04/03/21 127/87  ?11/26/20 128/81  ? ?Wt Readings from Last 3 Encounters:  ?05/21/21 203 lb 12.8 oz (92.4 kg)  ?01/15/21 200 lb (90.7 kg)  ?11/26/20 203 lb 6.4 oz (92.3 kg)  ? ? ?Physical Exam ?Constitutional:   ?   General: She is not in acute distress. ?   Appearance: She is not diaphoretic.  ?Cardiovascular:  ?   Rate and Rhythm: Normal rate and regular rhythm.  ?   Heart sounds:  Normal heart sounds.  ?Pulmonary:  ?   Effort: Pulmonary effort is normal.  ?   Breath sounds: Normal breath sounds.  ?Skin: ?   General: Skin is warm and dry.  ?Neurological:  ?   Mental Status: She is alert.  ? ? ? ?Assessment/Plan: Please see individual problem list. ? ?Problem List Items Addressed This Visit   ? ? Anxiety and depression (Chronic)  ?  Asymptomatic.  She can continue Cymbalta 90 mg daily.  Discussed the risk of serotonin syndrome in taking this in combination with her amitriptyline.  She will monitor for signs and symptoms of serotonin syndrome and let us know immediately if they occur. ?  ?  ? Chronic insomnia (Chronic)  ?  Stable.  She can continue Ambien 6.25 mg nightly. ?  ?  ? Obesity (BMI 30.0-34.9) (Chronic)  ?  I encouraged continued healthy exercise.  Discussed adding fruits and vegetables to her diet.  Given her absence of weight loss despite significant calorie restriction and exercise increase we did discuss weight loss medication.  She reports her sister had thyroid cancer though she is unsure which kind.  She will confirm that.  She notes no family or personal history of parathyroid cancer or adrenal gland cancer.  No personal history of pancreatitis or gallbladder issues.  I discussed that injectable medicine such as Mancel Parsons do carry a risk of pancreatitis or gallbladder issues.  Advise she would have to discontinue the medicine and let us know if she develops abdominal pain.  If she had severe abdominal pain on this medication she would need to seek medical attention immediately.  Discussed that medullary thyroid cancer has been seen in rats studies though this does not seem to translate into humans.  Discussed if we are able to start this medication she would need to monitor her thyroid area and if she ever developed any issues in that area she would need to let us know right away.  I will wait on her MyChart message with the type of thyroid cancer her sister had prior to sending  the medication in. ?  ?  ? Vaginal bleeding  ?  Likely menses though I advised if it persists she needs to follow-up with her gynecologist sooner than scheduled. ?  ?  ? ? ?Return in about 3 months (around 08/20/2021) for Insomnia. ? ?This visit occurred during the SARS-CoV-2 public health emergency.  Safety protocols were in place, including screening questions prior to the visit, additional usage of staff PPE, and extensive cleaning of exam room while observing appropriate contact time as indicated for disinfecting  solutions.  ? ? ?Tommi Rumps, MD ?[place] ? ?

## 2021-05-21 NOTE — Assessment & Plan Note (Signed)
Likely menses though I advised if it persists she needs to follow-up with her gynecologist sooner than scheduled. ?

## 2021-05-27 ENCOUNTER — Encounter: Payer: Self-pay | Admitting: Physician Assistant

## 2021-05-27 ENCOUNTER — Ambulatory Visit: Payer: Managed Care, Other (non HMO) | Admitting: Physician Assistant

## 2021-05-27 VITALS — BP 125/90 | HR 90 | Ht 64.0 in | Wt 199.8 lb

## 2021-05-27 DIAGNOSIS — M797 Fibromyalgia: Secondary | ICD-10-CM

## 2021-05-27 DIAGNOSIS — R5383 Other fatigue: Secondary | ICD-10-CM

## 2021-05-27 DIAGNOSIS — M170 Bilateral primary osteoarthritis of knee: Secondary | ICD-10-CM

## 2021-05-27 DIAGNOSIS — F5104 Psychophysiologic insomnia: Secondary | ICD-10-CM

## 2021-05-27 DIAGNOSIS — G90A Postural orthostatic tachycardia syndrome (POTS): Secondary | ICD-10-CM

## 2021-05-27 DIAGNOSIS — M62838 Other muscle spasm: Secondary | ICD-10-CM

## 2021-05-27 DIAGNOSIS — Z8659 Personal history of other mental and behavioral disorders: Secondary | ICD-10-CM

## 2021-05-27 DIAGNOSIS — G2581 Restless legs syndrome: Secondary | ICD-10-CM

## 2021-05-27 DIAGNOSIS — Z8669 Personal history of other diseases of the nervous system and sense organs: Secondary | ICD-10-CM

## 2021-05-27 DIAGNOSIS — Z87440 Personal history of urinary (tract) infections: Secondary | ICD-10-CM

## 2021-05-27 DIAGNOSIS — M7062 Trochanteric bursitis, left hip: Secondary | ICD-10-CM

## 2021-05-27 DIAGNOSIS — K588 Other irritable bowel syndrome: Secondary | ICD-10-CM

## 2021-05-27 MED ORDER — LIDOCAINE HCL 1 % IJ SOLN
0.5000 mL | INTRAMUSCULAR | Status: AC | PRN
  Administered 2021-05-27: .5 mL

## 2021-05-27 MED ORDER — TRIAMCINOLONE ACETONIDE 40 MG/ML IJ SUSP
10.0000 mg | INTRAMUSCULAR | Status: AC | PRN
  Administered 2021-05-27: 10 mg via INTRAMUSCULAR

## 2021-05-29 ENCOUNTER — Encounter: Payer: Managed Care, Other (non HMO) | Admitting: Dermatology

## 2021-05-29 MED ORDER — SEMAGLUTIDE-WEIGHT MANAGEMENT 0.5 MG/0.5ML ~~LOC~~ SOAJ: 0.5000 mg | SUBCUTANEOUS | 0 refills | Status: AC

## 2021-05-29 MED ORDER — SEMAGLUTIDE-WEIGHT MANAGEMENT 1 MG/0.5ML ~~LOC~~ SOAJ: 1.0000 mg | SUBCUTANEOUS | 0 refills | Status: DC

## 2021-05-29 MED ORDER — SEMAGLUTIDE-WEIGHT MANAGEMENT 1.7 MG/0.75ML ~~LOC~~ SOAJ: 1.7000 mg | SUBCUTANEOUS | 0 refills | Status: DC

## 2021-05-29 MED ORDER — SEMAGLUTIDE-WEIGHT MANAGEMENT 2.4 MG/0.75ML ~~LOC~~ SOAJ: 2.4000 mg | SUBCUTANEOUS | 3 refills | Status: DC

## 2021-05-29 MED ORDER — SEMAGLUTIDE-WEIGHT MANAGEMENT 0.25 MG/0.5ML ~~LOC~~ SOAJ: 0.2500 mg | SUBCUTANEOUS | 0 refills | Status: DC

## 2021-06-03 NOTE — Telephone Encounter (Signed)
Patient was approved for Wegovy inj. 2.4 05/30/2021-12/30/2021.  Kushi Kun,cma  ?

## 2021-06-04 ENCOUNTER — Other Ambulatory Visit: Payer: Self-pay | Admitting: Family Medicine

## 2021-06-04 DIAGNOSIS — B009 Herpesviral infection, unspecified: Secondary | ICD-10-CM

## 2021-06-05 ENCOUNTER — Other Ambulatory Visit: Payer: Self-pay | Admitting: Family Medicine

## 2021-06-05 DIAGNOSIS — F5104 Psychophysiologic insomnia: Secondary | ICD-10-CM

## 2021-06-18 ENCOUNTER — Ambulatory Visit: Payer: Managed Care, Other (non HMO) | Admitting: Family Medicine

## 2021-06-18 ENCOUNTER — Encounter: Payer: Self-pay | Admitting: Family Medicine

## 2021-06-18 VITALS — BP 129/84 | HR 96 | Wt 195.0 lb

## 2021-06-18 DIAGNOSIS — Z30433 Encounter for removal and reinsertion of intrauterine contraceptive device: Secondary | ICD-10-CM

## 2021-06-18 DIAGNOSIS — R7301 Impaired fasting glucose: Secondary | ICD-10-CM | POA: Insufficient documentation

## 2021-06-18 DIAGNOSIS — Z124 Encounter for screening for malignant neoplasm of cervix: Secondary | ICD-10-CM

## 2021-06-18 MED ORDER — LEVONORGESTREL 20.1 MCG/DAY IU IUD
1.0000 | INTRAUTERINE_SYSTEM | Freq: Once | INTRAUTERINE | Status: AC
  Administered 2021-06-18: 1 via INTRAUTERINE

## 2021-06-18 NOTE — Progress Notes (Signed)
RGYN presents for IUD Removal Reinsertion. ?Liletta  ? ?Last pap: 03/08/2018 WNL  ? ?

## 2021-06-18 NOTE — Progress Notes (Signed)
? ?  Subjective: ?Patient here for IUD change.  Has been in for 6 years and due to be changed. She has no cycles and desires to continue [place] IUD for contraception. ?Needs pap smear--last done 2020. ? ?Objective: ?Vitals:  ? 06/18/21 0919  ?BP: 129/84  ?Pulse: 96  ? ?Physical Examination: General appearance - alert, well appearing, and in no distress ?Chest - normal effort ?Heart - normal rate and regular rhythm ?Abdomen - soft, non-tender ?Pelvic - VULVA: normal appearing vulva with no masses, tenderness or lesions, VAGINA: normal appearing vagina with normal color and discharge, no lesions, CERVIX: normal appearing cervix without discharge or lesions, IUD strings noted, exam chaperoned by Keenan Bachelor, pap smear obtained ? ?Patient identified, informed consent performed, signed copy in chart, time out was performed ? ?Procedure: ?Speculum placed inside vagina.  Cervix visualized.  Strings grasped with ring forceps.  IUD removed intact. ? ?Procedure: ?Cervix visualized.  Cleaned with Betadine x 2.  Grasped anteriourly with a single tooth tenaculum.  Uterus sounded to 9 cm.  Liletta IUD placed per manufacturer's recommendations.  Strings trimmed to 3 cm.  ? ?Patient given post procedure instructions and Liletta care card with expiration date.  Patient is asked to check IUD strings periodically and follow up in 4-6 weeks for IUD check. ? ?Impression: ?Screening for cervical cancer - Plan: IGP, Aptima HPV, rfx 16/18,45, CANCELED: Cytology - PAP( [facility]) ? ?Encounter for IUD removal and reinsertion - Plan: levonorgestrel (LILETTA) 20.1 MCG/DAY IUD 1 each ? ? ?Plan: ?Continue IUD for 8 years. ? ? ?

## 2021-06-23 ENCOUNTER — Encounter: Payer: Self-pay | Admitting: Family Medicine

## 2021-06-24 LAB — IGP, APTIMA HPV, RFX 16/18,45
HPV Aptima: NEGATIVE
PAP Smear Comment: 0

## 2021-06-25 ENCOUNTER — Ambulatory Visit: Payer: Managed Care, Other (non HMO) | Admitting: Family Medicine

## 2021-06-25 ENCOUNTER — Encounter: Payer: Self-pay | Admitting: Family Medicine

## 2021-06-25 VITALS — BP 120/80 | HR 102 | Temp 98.3°F | Ht 64.0 in | Wt 190.0 lb

## 2021-06-25 VITALS — BP 124/87 | HR 91

## 2021-06-25 DIAGNOSIS — R102 Pelvic and perineal pain: Secondary | ICD-10-CM

## 2021-06-25 DIAGNOSIS — Z30432 Encounter for removal of intrauterine contraceptive device: Secondary | ICD-10-CM

## 2021-06-25 DIAGNOSIS — R109 Unspecified abdominal pain: Secondary | ICD-10-CM | POA: Insufficient documentation

## 2021-06-25 DIAGNOSIS — R1030 Lower abdominal pain, unspecified: Secondary | ICD-10-CM | POA: Diagnosis not present

## 2021-06-25 DIAGNOSIS — Z30013 Encounter for initial prescription of injectable contraceptive: Secondary | ICD-10-CM | POA: Diagnosis not present

## 2021-06-25 DIAGNOSIS — Z3042 Encounter for surveillance of injectable contraceptive: Secondary | ICD-10-CM

## 2021-06-25 MED ORDER — MEDROXYPROGESTERONE ACETATE 150 MG/ML IM SUSP
150.0000 mg | Freq: Once | INTRAMUSCULAR | Status: AC
  Administered 2021-06-25: 150 mg via INTRAMUSCULAR

## 2021-06-25 NOTE — Assessment & Plan Note (Signed)
Likely related to her IUD given the time course.  It looks like there is no message in with her gynecologist at this time.  I encouraged the patient to contact them again today to discuss this issue.  I did offer to do a pelvic exam though she wanted to defer this to gynecology.  I did discuss the potential that some of the discomfort could be related to the [facility].  We will check a lipase and hepatic function panel to evaluate for pancreatic or gallbladder issue though if those are negative I think she can continue on the [facility] at least until she sorts out the possible IUD issue.  She was advised to seek medical attention if she developed any worsening symptoms. ?

## 2021-06-25 NOTE — Progress Notes (Signed)
?Tommi Rumps, MD ?Phone: [phone] ? ?DOREENE FORREY is a 41 y.o. female who presents today for same-day visit. ? ?Abdominal pain: Patient reports lower abdominal discomfort starting when she had her IUD placed a week ago.  She notes there is a dull ache most of the time though has intermittent stabbing discomfort if she bends over.  She notes no nausea or vomiting.  No blood in her stool.  No fevers.  She does report vaginal bleeding that is moderate in nature and typically is only noticeable when she wipes.  She notes there is progressively less bleeding.  She is been taking Aleve with little benefit.  She notes she called her gynecology office though never got a call back.  She has been on [employer].  She notes her nausea related that has improved.  She was having some increased heartburn with this though that has resolved.  She notes no epigastric pain.  She reports no concern for being pregnant. ? ?Social History  ? ?Tobacco Use  ?Smoking Status Never  ? Passive exposure: Past  ?Smokeless Tobacco Never  ? ? ?Current Outpatient Medications on File Prior to Visit  ?Medication Sig Dispense Refill  ? albuterol (VENTOLIN HFA) 108 (90 Base) MCG/ACT inhaler Inhale 2 puffs into the lungs every 4 (four) hours as needed for wheezing or shortness of breath. 18 each 0  ? amitriptyline (ELAVIL) 50 MG tablet TAKE ONE TABLET (50 MG) BY MOUTH AT BEDTIME 90 tablet 4  ? amoxicillin-clavulanate (AUGMENTIN) 875-125 MG tablet Take 1 tablet by mouth 2 (two) times daily. 20 tablet 0  ? Azelastine-Fluticasone 137-50 MCG/ACT SUSP Place 1 spray into both nostrils 2 (two) times daily.    ? Cholecalciferol (VITAMIN D PO) Take 5,000 Int'l Units by mouth once a week.    ? diclofenac sodium (VOLTAREN) 1 % GEL Apply 4 g topically 3 (three) times daily as needed.    ? divalproex (DEPAKOTE) 500 MG DR tablet TAKE ONE TABLET (500 MG) BY MOUTH EVERY DAY 90 tablet 4  ? DULoxetine (CYMBALTA) 30 MG capsule TAKE THREE CAPSULES DAILY 270  capsule 1  ? EPINEPHrine 0.3 mg/0.3 mL IJ SOAJ injection Inject into the muscle.    ? fluticasone (FLONASE) 50 MCG/ACT nasal spray Place 2 sprays into both nostrils daily. 16 g 6  ? gentamicin cream (GARAMYCIN) 0.1 % Apply 1 application topically as needed (for nose).    ? HYDROcodone bit-homatropine (HYCODAN) 5-1.5 MG/5ML syrup Take 5 mLs by mouth every 6 (six) hours as needed for cough. 120 mL 0  ? ketoconazole (NIZORAL) 2 % cream Apply to crease of nose twice daily as needed for flake and itch. 60 g 3  ? loratadine (CLARITIN) 10 MG tablet TAKE 1 TABLET (10 MG TOTAL) BY MOUTH DAILY. 90 tablet 3  ? omeprazole (PRILOSEC) 20 MG capsule TAKE 1 CAPSULE BY MOUTH ONCE DAILY 90 capsule 3  ? ondansetron (ZOFRAN-ODT) 4 MG disintegrating tablet Take 1 tablet (4 mg total) by mouth every 8 (eight) hours as needed for nausea. 30 tablet 12  ? predniSONE (DELTASONE) 20 MG tablet Take two tablets by mouth once daily for 5 days. 10 tablet 0  ? Semaglutide-Weight Management 0.25 MG/0.5ML SOAJ Inject 0.25 mg into the skin once a week for 28 days. 2 mL 0  ? [START ON 06/27/2021] Semaglutide-Weight Management 0.5 MG/0.5ML SOAJ Inject 0.5 mg into the skin once a week for 28 days. 2 mL 0  ? [START ON 07/26/2021] Semaglutide-Weight Management 1 MG/0.5ML SOAJ Inject  1 mg into the skin once a week for 28 days. 2 mL 0  ? [START ON 08/24/2021] Semaglutide-Weight Management 1.7 MG/0.75ML SOAJ Inject 1.7 mg into the skin once a week for 28 days. 3 mL 0  ? [START ON 09/22/2021] Semaglutide-Weight Management 2.4 MG/0.75ML SOAJ Inject 2.4 mg into the skin once a week for 28 days. 3 mL 3  ? valACYclovir (VALTREX) 500 MG tablet TAKE 1 TABLET BY MOUTH EVERY DAY 30 tablet 3  ? zolpidem (AMBIEN CR) 6.25 MG CR tablet TAKE ONE AT BEDTIME AS NEEDED FOR SLEEP. 30 tablet 1  ? ?No current facility-administered medications on file prior to visit.  ? ? ? ?ROS see history of present illness ? ?Objective ? ?Physical Exam ?Vitals:  ? 06/25/21 1101  ?BP: 120/80  ?Pulse:  (!) 102  ?Temp: 98.3 ?F (36.8 ?C)  ?SpO2: 99%  ? ? ?BP Readings from Last 3 Encounters:  ?06/25/21 120/80  ?06/18/21 129/84  ?05/27/21 125/90  ? ?Wt Readings from Last 3 Encounters:  ?06/25/21 190 lb (86.2 kg)  ?06/18/21 195 lb (88.5 kg)  ?05/27/21 199 lb 12.8 oz (90.6 kg)  ? ? ?Physical Exam ?Constitutional:   ?   General: She is not in acute distress. ?   Appearance: She is not diaphoretic.  ?Cardiovascular:  ?   Rate and Rhythm: Normal rate and regular rhythm.  ?   Heart sounds: Normal heart sounds.  ?Pulmonary:  ?   Effort: Pulmonary effort is normal.  ?   Breath sounds: Normal breath sounds.  ?Abdominal:  ?   General: Bowel sounds are normal. There is no distension.  ?   Palpations: Abdomen is soft.  ?   Tenderness: There is no guarding or rebound.  ?   Comments: Slight tenderness lower abdomen  ?Skin: ?   General: Skin is warm and dry.  ?Neurological:  ?   Mental Status: She is alert.  ? ? ? ?Assessment/Plan: Please see individual problem list. ? ?Problem List Items Addressed This Visit   ? ? Abdominal pain - Primary  ?  Likely related to her IUD given the time course.  It looks like there is no message in with her gynecologist at this time.  I encouraged the patient to contact them again today to discuss this issue.  I did offer to do a pelvic exam though she wanted to defer this to gynecology.  I did discuss the potential that some of the discomfort could be related to the [facility].  We will check a lipase and hepatic function panel to evaluate for pancreatic or gallbladder issue though if those are negative I think she can continue on the [facility] at least until she sorts out the possible IUD issue.  She was advised to seek medical attention if she developed any worsening symptoms. ? ?  ?  ? Relevant Orders  ? Hepatic function panel  ? Lipase  ? ? ? ?Return if symptoms worsen or fail to improve. ? ? ?Tommi Rumps, MD ?[place] ? ?

## 2021-06-25 NOTE — Progress Notes (Signed)
Pelvic pain since getting IUD placed, has taken NSAIDs with no relief ?

## 2021-06-25 NOTE — Patient Instructions (Signed)
Please call GYN to set up evaluation for your IUD.  ?We will contact you with your lab results.  ?If your pain worsens please go to the ED.  ?

## 2021-06-25 NOTE — Progress Notes (Signed)
? ?  Subjective:  ? ? Patient ID: Margaret Silva is a 41 y.o. female presenting with Pain ? on 06/25/2021 ? ?HPI: ?Here for f/u Had IUD placed last week. And having continuous pain since. DUll and occasional heavier cramping. Has been having bleeding. Would like her IUD removed. Has been unable to work. Desires contraception. ? ?Review of Systems  ?Constitutional:  Negative for chills and fever.  ?Respiratory:  Negative for shortness of breath.   ?Cardiovascular:  Negative for chest pain.  ?Gastrointestinal:  Positive for abdominal pain. Negative for nausea and vomiting.  ?Genitourinary:  Positive for pelvic pain. Negative for dysuria.  ?Skin:  Negative for rash.  ?   ?Objective:  ?  ?BP 124/87   Pulse 91  ?Physical Exam ?Exam conducted with a chaperone present.  ?Constitutional:   ?   General: She is not in acute distress. ?   Appearance: She is well-developed.  ?HENT:  ?   Head: Normocephalic and atraumatic.  ?Eyes:  ?   General: No scleral icterus. ?Cardiovascular:  ?   Rate and Rhythm: Normal rate.  ?Pulmonary:  ?   Effort: Pulmonary effort is normal.  ?Abdominal:  ?   Palpations: Abdomen is soft.  ?Musculoskeletal:  ?   Cervical back: Neck supple.  ?Skin: ?   General: Skin is warm and dry.  ?Neurological:  ?   Mental Status: She is alert and oriented to person, place, and time.  ? ?Procedure: ?Speculum placed inside vagina.  Cervix visualized.  Strings grasped with ring forceps.  IUD removed intact. ? ? ?   ?Assessment & Plan:  ?Encounter for IUD removal - IUD removed per patient request. ? ?Depo-Provera contraceptive status - Begin Depo. ? ? ? ?Return in about 3 months (around 09/25/2021) for Depo Provera. ? ?Donnamae Jude, MD ?06/25/2021 ?3:55 PM ? ? ? ?

## 2021-06-26 ENCOUNTER — Encounter: Payer: Self-pay | Admitting: Family Medicine

## 2021-06-26 LAB — HEPATIC FUNCTION PANEL
ALT: 13 IU/L (ref 0–32)
AST: 10 IU/L (ref 0–40)
Albumin: 3.7 g/dL — ABNORMAL LOW (ref 3.8–4.8)
Alkaline Phosphatase: 75 IU/L (ref 44–121)
Bilirubin Total: 0.3 mg/dL (ref 0.0–1.2)
Bilirubin, Direct: <0.1 mg/dL (ref 0.00–0.40)
Total Protein: 6 g/dL (ref 6.0–8.5)

## 2021-06-26 LAB — LIPASE: Lipase: 21 U/L (ref 14–72)

## 2021-07-01 ENCOUNTER — Other Ambulatory Visit: Payer: Self-pay | Admitting: Family Medicine

## 2021-07-01 NOTE — Telephone Encounter (Signed)
She should already have a refill of the 0.5 mg dose at the pharmacy.  ?

## 2021-07-02 NOTE — Telephone Encounter (Signed)
Pt called in requesting refill on medication (Semaglutide-Weight Management 2.4 MG/0.75ML SOAJ)... Pt requesting callback ?

## 2021-07-07 ENCOUNTER — Encounter: Payer: Self-pay | Admitting: Family Medicine

## 2021-07-11 ENCOUNTER — Other Ambulatory Visit: Payer: Self-pay | Admitting: Family Medicine

## 2021-07-17 ENCOUNTER — Emergency Department
Admission: EM | Admit: 2021-07-17 | Discharge: 2021-07-17 | Disposition: A | Discharge status: Home / Self Care | Payer: Managed Care, Other (non HMO) | Attending: Emergency Medicine | Admitting: Emergency Medicine

## 2021-07-17 ENCOUNTER — Telehealth: Payer: Self-pay | Admitting: Family Medicine

## 2021-07-17 ENCOUNTER — Ambulatory Visit
Admission: EM | Admit: 2021-07-17 | Discharge: 2021-07-17 | Disposition: A | Discharge status: Home / Self Care | Payer: Managed Care, Other (non HMO) | Attending: Family Medicine | Admitting: Family Medicine

## 2021-07-17 ENCOUNTER — Other Ambulatory Visit: Payer: Self-pay

## 2021-07-17 ENCOUNTER — Encounter: Payer: Self-pay | Admitting: Emergency Medicine

## 2021-07-17 ENCOUNTER — Encounter: Payer: Self-pay | Admitting: *Deleted

## 2021-07-17 DIAGNOSIS — T887XXA Unspecified adverse effect of drug or medicament, initial encounter: Secondary | ICD-10-CM

## 2021-07-17 DIAGNOSIS — T50995A Adverse effect of other drugs, medicaments and biological substances, initial encounter: Secondary | ICD-10-CM | POA: Insufficient documentation

## 2021-07-17 DIAGNOSIS — R1084 Generalized abdominal pain: Secondary | ICD-10-CM | POA: Insufficient documentation

## 2021-07-17 DIAGNOSIS — R112 Nausea with vomiting, unspecified: Secondary | ICD-10-CM

## 2021-07-17 DIAGNOSIS — J45909 Unspecified asthma, uncomplicated: Secondary | ICD-10-CM | POA: Insufficient documentation

## 2021-07-17 DIAGNOSIS — T50905A Adverse effect of unspecified drugs, medicaments and biological substances, initial encounter: Secondary | ICD-10-CM

## 2021-07-17 LAB — COMPREHENSIVE METABOLIC PANEL
ALT: 17 U/L (ref 0–44)
AST: 14 U/L — ABNORMAL LOW (ref 15–41)
Albumin: 4.2 g/dL (ref 3.5–5.0)
Alkaline Phosphatase: 73 U/L (ref 38–126)
Anion gap: 8 (ref 5–15)
BUN: 13 mg/dL (ref 6–20)
CO2: 23 mmol/L (ref 22–32)
Calcium: 9.8 mg/dL (ref 8.9–10.3)
Chloride: 105 mmol/L (ref 98–111)
Creatinine, Ser: 0.86 mg/dL (ref 0.44–1.00)
GFR, Estimated: >60 mL/min (ref >60)
Glucose, Bld: 106 mg/dL — ABNORMAL HIGH (ref 70–99)
Potassium: 4.3 mmol/L (ref 3.5–5.1)
Sodium: 136 mmol/L (ref 135–145)
Total Bilirubin: 0.7 mg/dL (ref 0.3–1.2)
Total Protein: 7.8 g/dL (ref 6.5–8.1)

## 2021-07-17 LAB — CBC
HCT: 44.4 % (ref 36.0–46.0)
Hemoglobin: 15.1 g/dL — ABNORMAL HIGH (ref 12.0–15.0)
MCH: 30.4 pg (ref 26.0–34.0)
MCHC: 34 g/dL (ref 30.0–36.0)
MCV: 89.3 fL (ref 80.0–100.0)
Platelets: 378 10*3/uL (ref 150–400)
RBC: 4.97 MIL/uL (ref 3.87–5.11)
RDW: 13.4 % (ref 11.5–15.5)
WBC: 11.1 10*3/uL — ABNORMAL HIGH (ref 4.0–10.5)
nRBC: 0 % (ref 0.0–0.2)

## 2021-07-17 LAB — LIPASE, BLOOD: Lipase: 27 U/L (ref 11–51)

## 2021-07-17 MED ORDER — METOCLOPRAMIDE HCL 10 MG PO TABS: 10.0000 mg | ORAL_TABLET | Freq: Three times a day (TID) | ORAL | 1 refills | Status: DC

## 2021-07-17 MED ORDER — ONDANSETRON HCL 4 MG PO TABS
4.0000 mg | ORAL_TABLET | Freq: Four times a day (QID) | ORAL | 0 refills | Status: DC
Start: 2021-07-17 — End: 2023-03-18

## 2021-07-17 MED ORDER — ONDANSETRON HCL 4 MG/2ML IJ SOLN
4.0000 mg | Freq: Once | INTRAMUSCULAR | Status: AC
  Administered 2021-07-17: 4 mg via INTRAMUSCULAR

## 2021-07-17 MED ORDER — ONDANSETRON HCL 4 MG/2ML IJ SOLN
4.0000 mg | Freq: Once | INTRAMUSCULAR | Status: AC
  Administered 2021-07-17: 4 mg via INTRAVENOUS
  Filled 2021-07-17: qty 2

## 2021-07-17 MED ORDER — METOCLOPRAMIDE HCL 5 MG/ML IJ SOLN
10.0000 mg | Freq: Once | INTRAMUSCULAR | Status: AC
  Administered 2021-07-17: 10 mg via INTRAVENOUS
  Filled 2021-07-17: qty 2

## 2021-07-17 MED ORDER — PROMETHAZINE HCL 12.5 MG RE SUPP: 12.5000 mg | Freq: Four times a day (QID) | RECTAL | 0 refills | Status: DC | PRN

## 2021-07-17 MED ORDER — SODIUM CHLORIDE 0.9 % IV BOLUS
1000.0000 mL | Freq: Once | INTRAVENOUS | Status: AC
  Administered 2021-07-17: 1000 mL via INTRAVENOUS

## 2021-07-17 NOTE — ED Triage Notes (Signed)
Pt started WEGOVY injections in April, during her treatment she would get nauseous but never vomited. She took a 2 week break before the next round of injections. Yesterday she received her first injection and started vomiting this morning.
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
## 2021-07-17 NOTE — Telephone Encounter (Signed)
Patient called and stated she started [facility] last night, since she started it she is vomiting, no other symptoms. Patient would like to know what to do.

## 2021-07-17 NOTE — Discharge Instructions (Addendum)
Hydrate well with fluids.  Wait at least 6 hours continue taking next dose of Zofran.  Take Zofran at the first sign of nausea.  Notify the prescriber of your Wescovy of your reaction to the medication today for further recommendations on next dosing. If at any point your symptoms become severe go immediately to the emergency department.

## 2021-07-17 NOTE — Discharge Instructions (Signed)
Please take the medications as prescribed.  Start with ice chips and clear liquids and then progressed to bland foods.  If you start vomiting again and medications are not working, please return to the emergency department for IV fluids.

## 2021-07-17 NOTE — ED Triage Notes (Signed)
Pt reports vomiting since midnight after taking wescovy injection for 2 months for weight loss.  Pt also reports diarrhea also.  Pt [facility] seen [facility] today and was given injection of zofran without relief.  Pt alert

## 2021-07-17 NOTE — ED Provider Triage Note (Signed)
Emergency Medicine Provider Triage Evaluation Note  Margaret Silva , a 41 y.o. female  was evaluated in triage.  Pt complains of nausea and vomiting.  Has had severe nausea and vomiting since yesterday after giving himself weight loss injection.  Was seen in urgent care earlier today, given Zofran with no improvement.  No fevers, abdominal pain.  Review of Systems  Positive: Nausea vomiting Negative: Chest pain shortness of breath fevers  Physical Exam  BP (!) 146/94 (BP Location: Left Arm)   Pulse 88   Temp 98.3 F (36.8 C) (Oral)   Resp 18   Ht '5\' 4"'$  (1.626 m)   Wt 86.2 kg   LMP  (LMP Unknown)   SpO2 100%   BMI 32.61 kg/m  Gen:   Awake, no distress   Resp:  Normal effort  MSK:   Moves extremities without difficulty  Other:    Medical Decision Making  Medically screening exam initiated at 4:28 PM.  Appropriate orders placed.  Margaret Silva was informed that the remainder of the evaluation will be completed by another provider, this initial triage assessment does not replace that evaluation, and the importance of remaining in the ED until their evaluation is complete.     Margaret Silva, [place] 07/17/21 1629

## 2021-07-17 NOTE — Telephone Encounter (Signed)
I called the patient  to informed her the instructions from the Doc of the day  but EMS was there, I spoke with her mother and she stated the patients was still vomiting, diarrhea and chills.  I informed her that we would follow up with her. Raaga Maeder,cma

## 2021-07-17 NOTE — ED Provider Notes (Signed)
[facility] Provider Note    Event Date/Time   First MD Initiated Contact with Patient 07/17/21 1658     (approximate)   History   Emesis and Abdominal Pain   HPI  Margaret Silva is a 41 y.o. female presenting to the emergency department for treatment and evaluation of abdominal pain with vomiting.  Patient has been on Wegovy and has developed nausea and vomiting. No relief with Zofran at urgent care. Patient reports multiple episodes of vomiting since last night and is unable to tolerate and fluids or foods. Diffuse abdominal cramping.  Past Medical History:  Diagnosis Date   Asthma    Bursitis of left hip    Diverticulitis    h/o   Endometriosis    GERD (gastroesophageal reflux disease)    Headache    History of chicken pox    History of colonoscopy 11/04 and April 06   History of laparoscopy 12/04 and 12/07   History of laparoscopy 01/29/2006   Dr. 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       Physical Exam   Triage Vital Signs: ED Triage Vitals  Enc Vitals Group     BP 07/17/21 1602 (!) 146/94     Pulse Rate 07/17/21 1602 88     Resp 07/17/21 1602 18     Temp 07/17/21 1602 98.3 F (36.8 C)     Temp Source 07/17/21 1602 Oral     SpO2 07/17/21 1602 100 %     Weight 07/17/21 1622 190 lb (86.2 kg)     Height 07/17/21 1622 '5\' 4"'$  (1.626 m)     Head Circumference --      Peak Flow --      Pain Score 07/17/21 1621 6     Pain Loc --      Pain Edu? --      Excl. in [place]? --     Most recent vital signs: Vitals:   07/17/21 1602 07/17/21 1912  BP: (!) 146/94 127/84  Pulse: 88 97  Resp: 18 16  Temp: 98.3 F (36.8 C)   SpO2:  100% 100%    General: Awake, no distress.  CV:  Good peripheral perfusion.  Resp:  Normal effort.  Abd:  No distention.  No focal tenderness, rebound, or guarding. Other:     ED Results / Procedures / Treatments   Labs (all labs ordered are listed, but only abnormal results are displayed) Labs Reviewed  COMPREHENSIVE METABOLIC PANEL - Abnormal; Notable for the following components:      Result Value   Glucose, Bld 106 (*)    AST 14 (*)    All other components within normal limits  CBC - Abnormal; Notable for the following components:   WBC 11.1 (*)    Hemoglobin 15.1 (*)    All other components within normal limits  LIPASE, BLOOD     EKG  Not indicated   RADIOLOGY  Not indicated  I have independently reviewed and interpreted imaging as well as reviewed report from radiology.  PROCEDURES:  Critical Care performed: No  Procedures   MEDICATIONS ORDERED IN ED:  Medications  sodium chloride 0.9 % bolus 1,000 mL (0 mLs Intravenous Stopped 07/17/21 2004)  metoCLOPramide (REGLAN) injection 10 mg (10 mg Intravenous Given 07/17/21 1807)  ondansetron (ZOFRAN) injection 4 mg (4 mg Intravenous Given 07/17/21 2002)     IMPRESSION / MDM / ASSESSMENT AND PLAN / ED COURSE   I reviewed the triage vital signs and the nursing notes.                              Differential diagnosis includes, but is not limited to: Gastroenteritis, colitis, medication reaction.  Patient's presentation is most consistent with acute illness / injury with system symptoms.  41 year old female presenting to the emergency department for treatment and evaluation of vomiting.  Patient states that she took her dose yesterday evening and soon after developed intense nausea and persistent vomiting.  She states that she vomited off-and-on most of the night.  She called her doctor this morning who advised her to not take any more doses.  She had gone to urgent care and received an injection of Zofran  without any relief.  Reglan and IV fluids have provided significant relief.  She still has slight nausea and will be given 1 additional dose of Zofran.  Nausea is relieved and patient is able to tolerate ice chips and small sips of water.  She feels safe for discharge.  She will be given a prescription for Reglan and Phenergan.  She already has an prescription for Zofran and was advised to use that before she uses the Phenergan.  If symptoms return she is to see primary care or return to the emergency department.     FINAL CLINICAL IMPRESSION(S) / ED DIAGNOSES   Final diagnoses:  Drug-induced nausea and vomiting     Rx / DC Orders   ED Discharge Orders          Ordered    metoCLOPramide (REGLAN) 10 MG tablet  3 times daily with meals        07/17/21 2043    promethazine (PHENERGAN) 12.5 MG suppository  Every 6 hours PRN        07/17/21 2043             Note:  This document was prepared using Dragon voice recognition software and may include unintentional dictation errors.   Victorino Dike, FNP 07/17/21 2211    Vladimir Crofts, MD 07/18/21 1125

## 2021-07-17 NOTE — Telephone Encounter (Signed)
Typically nausea/vomiting can happen 1-3 days after the dose  Take zofran as needed  If not able to tolerate wegovy pt will need to stop and f/u with PCP

## 2021-07-18 NOTE — Telephone Encounter (Signed)
Can you follow-up with the patient and see how she is doing after her emergency room visit?  Thanks.

## 2021-07-21 NOTE — Telephone Encounter (Signed)
I called the patient and spoke with her and she is doing better, she is scheduled to see provider for a follow up form [facility] on this wednesdays.  Ashleah Valtierra,cma

## 2021-07-23 ENCOUNTER — Encounter: Payer: Self-pay | Admitting: Family Medicine

## 2021-07-23 ENCOUNTER — Ambulatory Visit: Payer: Managed Care, Other (non HMO) | Admitting: Family Medicine

## 2021-07-23 DIAGNOSIS — R110 Nausea: Secondary | ICD-10-CM | POA: Diagnosis not present

## 2021-07-23 NOTE — Patient Instructions (Signed)
Nice to see you. Please send me a message in 2 to 3 weeks letting me know how your nausea is doing.  If it has resolved we could restart the [facility].

## 2021-07-23 NOTE — Assessment & Plan Note (Signed)
This was medication related.  The pharmacy inappropriately gave her the highest dose of Wegovy without her ramping up the dose over a number of months.  I think it would be acceptable for her to retry the [facility] with the appropriate ramping protocol though we will give her several weeks to recover.  She will send me a message in 2 to 3 weeks to let me know how she is doing and we can consider restarting medication.

## 2021-07-23 NOTE — Progress Notes (Signed)
Tommi Rumps, MD Phone: [phone]  Margaret Silva is a 41 y.o. female who presents today for follow-up.  Nausea: Patient reported nausea, vomiting, abdominal discomfort, and diarrhea after taking a new dose of Wegovy.  She notes that she got the 2.4 mg dose from the pharmacy when they advised her that they did not have the lower doses.  She had only taken the 0.25 mg dose prior to that.  The prescriptions are listed with specific dates for each dose to be filled.  The patient notes no vomiting or diarrhea.  She notes the abdominal discomfort has improved significantly.  She notes continued mild nausea.  Social History   Tobacco Use  Smoking Status Never   Passive exposure: Past  Smokeless Tobacco Never    Current Outpatient Medications on File Prior to Visit  Medication Sig Dispense Refill   albuterol (VENTOLIN HFA) 108 (90 Base) MCG/ACT inhaler Inhale 2 puffs into the lungs every 4 (four) hours as needed for wheezing or shortness of breath. 18 each 0   amitriptyline (ELAVIL) 50 MG tablet TAKE ONE TABLET (50 MG) BY MOUTH AT BEDTIME 90 tablet 4   amoxicillin-clavulanate (AUGMENTIN) 875-125 MG tablet Take 1 tablet by mouth 2 (two) times daily. 20 tablet 0   Azelastine-Fluticasone 137-50 MCG/ACT SUSP Place 1 spray into both nostrils 2 (two) times daily.     Cholecalciferol (VITAMIN D PO) Take 5,000 Int'l Units by mouth once a week.     diclofenac sodium (VOLTAREN) 1 % GEL Apply 4 g topically 3 (three) times daily as needed.     divalproex (DEPAKOTE) 500 MG DR tablet TAKE ONE TABLET (500 MG) BY MOUTH EVERY DAY 90 tablet 4   DULoxetine (CYMBALTA) 30 MG capsule TAKE THREE CAPSULES DAILY 270 capsule 1   EPINEPHrine 0.3 mg/0.3 mL IJ SOAJ injection Inject into the muscle.     fluticasone (FLONASE) 50 MCG/ACT nasal spray Place 2 sprays into both nostrils daily. 16 g 6   gentamicin cream (GARAMYCIN) 0.1 % Apply 1 application topically as needed (for nose).     HYDROcodone  bit-homatropine (HYCODAN) 5-1.5 MG/5ML syrup Take 5 mLs by mouth every 6 (six) hours as needed for cough. 120 mL 0   ketoconazole (NIZORAL) 2 % cream Apply to crease of nose twice daily as needed for flake and itch. 60 g 3   loratadine (CLARITIN) 10 MG tablet TAKE 1 TABLET (10 MG TOTAL) BY MOUTH DAILY. 90 tablet 3   metoCLOPramide (REGLAN) 10 MG tablet Take 1 tablet (10 mg total) by mouth 3 (three) times daily with meals. 90 tablet 1   omeprazole (PRILOSEC) 20 MG capsule TAKE 1 CAPSULE BY MOUTH ONCE DAILY 90 capsule 3   ondansetron (ZOFRAN) 4 MG tablet Take 1 tablet (4 mg total) by mouth every 6 (six) hours. 30 tablet 0   ondansetron (ZOFRAN-ODT) 4 MG disintegrating tablet Take 1 tablet (4 mg total) by mouth every 8 (eight) hours as needed for nausea. 30 tablet 12   predniSONE (DELTASONE) 20 MG tablet Take two tablets by mouth once daily for 5 days. 10 tablet 0   promethazine (PHENERGAN) 12.5 MG suppository Place 1 suppository (12.5 mg total) rectally every 6 (six) hours as needed for nausea or vomiting. 12 each 0   Semaglutide-Weight Management 0.5 MG/0.5ML SOAJ Inject 0.5 mg into the skin once a week for 28 days. 2 mL 0   [START ON 07/26/2021] Semaglutide-Weight Management 1 MG/0.5ML SOAJ Inject 1 mg into the skin once a week for  28 days. 2 mL 0   [START ON 08/24/2021] Semaglutide-Weight Management 1.7 MG/0.75ML SOAJ Inject 1.7 mg into the skin once a week for 28 days. 3 mL 0   [START ON 09/22/2021] Semaglutide-Weight Management 2.4 MG/0.75ML SOAJ Inject 2.4 mg into the skin once a week for 28 days. 3 mL 3   valACYclovir (VALTREX) 500 MG tablet TAKE 1 TABLET BY MOUTH EVERY DAY 30 tablet 3   WEGOVY 0.25 MG/0.5ML SOAJ INJECT 0.25 MG INTO THE SKIN ONCE A WEEK FOR 28 DAYS 2 mL 0   zolpidem (AMBIEN CR) 6.25 MG CR tablet TAKE ONE AT BEDTIME AS NEEDED FOR SLEEP. 30 tablet 1   No current facility-administered medications on file prior to visit.     ROS see history of present  illness  Objective  Physical Exam Vitals:   07/23/21 1359  BP: 110/70  Pulse: 94  Temp: 98.2 F (36.8 C)  SpO2: 97%    BP Readings from Last 3 Encounters:  07/23/21 110/70  07/17/21 127/84  07/17/21 125/87   Wt Readings from Last 3 Encounters:  07/23/21 183 lb (83 kg)  07/17/21 190 lb (86.2 kg)  06/25/21 190 lb (86.2 kg)    Physical Exam Constitutional:      General: She is not in acute distress.    Appearance: She is not diaphoretic.  Pulmonary:     Effort: Pulmonary effort is normal.  Abdominal:     General: Bowel sounds are normal. There is no distension.     Palpations: Abdomen is soft.     Tenderness: There is no abdominal tenderness. There is no guarding or rebound.  Skin:    General: Skin is warm and dry.  Neurological:     Mental Status: She is alert.     Assessment/Plan: Please see individual problem list.  Problem List Items Addressed This Visit     Nausea    This was medication related.  The pharmacy inappropriately gave her the highest dose of Wegovy without her ramping up the dose over a number of months.  I think it would be acceptable for her to retry the [facility] with the appropriate ramping protocol though we will give her several weeks to recover.  She will send me a message in 2 to 3 weeks to let me know how she is doing and we can consider restarting medication.        Return in about 3 months (around 10/23/2021) for Weight follow-up, okay to reschedule the July visit for 3 months from now.   Tommi Rumps, MD [place]

## 2021-07-31 ENCOUNTER — Encounter: Payer: Managed Care, Other (non HMO) | Admitting: Dermatology

## 2021-08-04 ENCOUNTER — Other Ambulatory Visit: Payer: Self-pay | Admitting: Family Medicine

## 2021-08-04 DIAGNOSIS — F5104 Psychophysiologic insomnia: Secondary | ICD-10-CM

## 2021-08-04 NOTE — Telephone Encounter (Signed)
Refilled: 06/06/2021 Last OV: 05/21/2021 Next OV: 10/29/2021

## 2021-08-07 ENCOUNTER — Encounter: Payer: Self-pay | Admitting: Family Medicine

## 2021-08-07 DIAGNOSIS — E669 Obesity, unspecified: Secondary | ICD-10-CM

## 2021-08-08 MED ORDER — SEMAGLUTIDE-WEIGHT MANAGEMENT 0.25 MG/0.5ML ~~LOC~~ SOAJ: 0.2500 mg | SUBCUTANEOUS | 0 refills | Status: DC

## 2021-08-08 MED ORDER — SEMAGLUTIDE-WEIGHT MANAGEMENT 1 MG/0.5ML ~~LOC~~ SOAJ: 1.0000 mg | SUBCUTANEOUS | 0 refills | Status: DC

## 2021-08-08 MED ORDER — SEMAGLUTIDE-WEIGHT MANAGEMENT 2.4 MG/0.75ML ~~LOC~~ SOAJ: 2.4000 mg | SUBCUTANEOUS | 1 refills | Status: DC

## 2021-08-08 MED ORDER — SEMAGLUTIDE-WEIGHT MANAGEMENT 1.7 MG/0.75ML ~~LOC~~ SOAJ: 1.7000 mg | SUBCUTANEOUS | 0 refills | Status: DC

## 2021-08-08 MED ORDER — SEMAGLUTIDE-WEIGHT MANAGEMENT 0.5 MG/0.5ML ~~LOC~~ SOAJ: 0.5000 mg | SUBCUTANEOUS | 0 refills | Status: DC

## 2021-08-11 ENCOUNTER — Encounter: Payer: Self-pay | Admitting: Family Medicine

## 2021-08-12 MED ORDER — SEMAGLUTIDE(0.25 OR 0.5MG/DOS) 2 MG/3ML ~~LOC~~ SOPN: PEN_INJECTOR | SUBCUTANEOUS | 1 refills | Status: AC

## 2021-08-20 ENCOUNTER — Ambulatory Visit: Payer: Managed Care, Other (non HMO) | Admitting: Family Medicine

## 2021-09-03 ENCOUNTER — Ambulatory Visit: Payer: Managed Care, Other (non HMO) | Admitting: Dermatology

## 2021-09-03 DIAGNOSIS — L720 Epidermal cyst: Secondary | ICD-10-CM | POA: Diagnosis not present

## 2021-09-03 DIAGNOSIS — L814 Other melanin hyperpigmentation: Secondary | ICD-10-CM

## 2021-09-03 DIAGNOSIS — Z1283 Encounter for screening for malignant neoplasm of skin: Secondary | ICD-10-CM | POA: Diagnosis not present

## 2021-09-03 DIAGNOSIS — D485 Neoplasm of uncertain behavior of skin: Secondary | ICD-10-CM

## 2021-09-03 DIAGNOSIS — D229 Melanocytic nevi, unspecified: Secondary | ICD-10-CM

## 2021-09-03 DIAGNOSIS — L700 Acne vulgaris: Secondary | ICD-10-CM | POA: Diagnosis not present

## 2021-09-03 DIAGNOSIS — L219 Seborrheic dermatitis, unspecified: Secondary | ICD-10-CM | POA: Diagnosis not present

## 2021-09-03 DIAGNOSIS — L578 Other skin changes due to chronic exposure to nonionizing radiation: Secondary | ICD-10-CM

## 2021-09-03 DIAGNOSIS — L821 Other seborrheic keratosis: Secondary | ICD-10-CM

## 2021-09-03 DIAGNOSIS — D2271 Melanocytic nevi of right lower limb, including hip: Secondary | ICD-10-CM | POA: Diagnosis not present

## 2021-09-03 DIAGNOSIS — D1800 Hemangioma unspecified site: Secondary | ICD-10-CM

## 2021-09-03 DIAGNOSIS — L570 Actinic keratosis: Secondary | ICD-10-CM

## 2021-09-03 DIAGNOSIS — D239 Other benign neoplasm of skin, unspecified: Secondary | ICD-10-CM

## 2021-09-03 HISTORY — DX: Other benign neoplasm of skin, unspecified: D23.9

## 2021-09-03 MED ORDER — CLINDAMYCIN PHOS-BENZOYL PEROX 1.2-5 % EX GEL: CUTANEOUS | 2 refills | Status: DC

## 2021-09-03 NOTE — Progress Notes (Signed)
Follow-Up Visit   Subjective  Margaret Silva is a 41 y.o. female who presents for the following: Annual Exam (The patient presents for Total-Body Skin Exam (TBSE) for skin cancer screening and mole check.  The patient has spots, moles and lesions to be evaluated, some may be new or changing and the patient has concerns that these could be cancer. Patient with no hx of skin cancer or abnormal moles. She does advise there is a new spot at left breast, noticed a few months ago.).  The following portions of the chart were reviewed this encounter and updated as appropriate:   Tobacco  Allergies  Meds  Problems  Med Hx  Surg Hx  Fam Hx      Review of Systems:  No other skin or systemic complaints except as noted in HPI or Assessment and Plan.  Objective  Well appearing patient in no apparent distress; mood and affect are within normal limits.  A full examination was performed including scalp, head, eyes, ears, nose, lips, neck, chest, axillae, abdomen, back, buttocks, bilateral upper extremities, bilateral lower extremities, hands, feet, fingers, toes, fingernails, and toenails. All findings within normal limits unless otherwise noted below.  Left Breast 3 o'clock Erythematous thin papules/macules with gritty scale.   Right Lateral Knee 0.3 cm irregular dark brown thin papule R/o Atypia  left upper cutaneous lip Firm erythematous papule   face Inflammatory papules  Head - Anterior (Face) Clear     Assessment & Plan  AK (actinic keratosis) Left Breast 3 o'clock  Pigmented at left breast  Actinic keratoses are precancerous spots that appear secondary to cumulative UV radiation exposure/sun exposure over time. They are chronic with expected duration over 1 year. A portion of actinic keratoses will progress to squamous cell carcinoma of the skin. It is not possible to reliably predict which spots will progress to skin cancer and so treatment is recommended to prevent  development of skin cancer.  Recommend daily broad spectrum sunscreen SPF 30+ to sun-exposed areas, reapply every 2 hours as needed.  Recommend staying in the shade or wearing long sleeves, sun glasses (UVA+UVB protection) and wide brim hats (4-inch brim around the entire circumference of the hat). Call for new or changing lesions.  Prior to procedure, discussed risks of blister formation, small wound, skin dyspigmentation, or rare scar following cryotherapy. Recommend Vaseline ointment to treated areas while healing.   Destruction of lesion - Left Breast 3 o'clock  Destruction method: cryotherapy   Informed consent: discussed and consent obtained   Lesion destroyed using liquid nitrogen: Yes   Region frozen until ice ball extended beyond lesion: Yes   Cryotherapy cycles:  2 Outcome: patient tolerated procedure well with no complications   Post-procedure details: wound care instructions given   Additional details:  Prior to procedure, discussed risks of blister formation, small wound, skin dyspigmentation, or rare scar following cryotherapy. Recommend Vaseline ointment to treated areas while healing.   Neoplasm of uncertain behavior of skin Right Lateral Knee  Epidermal / dermal shaving  Lesion diameter (cm):  0.3 Informed consent: discussed and consent obtained   Timeout: patient name, date of birth, surgical site, and procedure verified   Anesthesia: the lesion was anesthetized in a standard fashion   Anesthetic:  1% lidocaine w/ epinephrine 1-100,000 local infiltration Instrument used: flexible razor blade   Hemostasis achieved with: aluminum chloride   Outcome: patient tolerated procedure well   Post-procedure details: wound care instructions given   Additional details:  Mupirocin  and a bandage applied  Specimen 1 - Surgical pathology Differential Diagnosis: R/o Atypia  Check Margins: No 0.3 cm irregular dark brown thin papule   Epidermal cyst left upper cutaneous  lip  Symptomatic per patient. Inflamed on exam.  Intralesional steroid injection side effects were reviewed including thinning of the skin and discoloration, such as redness, lightening or darkening.  NDC 9371-6967-89   Intralesional injection - left upper cutaneous lip Location: left upper cutaneous lip  Informed Consent: Discussed risks (infection, pain, bleeding, bruising, thinning of the skin, loss of skin pigment, lack of resolution, and recurrence of lesion) and benefits of the procedure, as well as the alternatives. Informed consent was obtained. Preparation: The area was prepared a standard fashion.  Procedure Details: An intralesional injection was performed with Kenalog 1.25 mg/cc. Marland Kitchen03 cc in total were injected.  Total number of injections: 1  Plan: The patient was instructed on post-op care. Recommend OTC analgesia as needed for pain.   Acne vulgaris face  Chronic and persistent condition with duration or expected duration over one year. Condition is symptomatic/ bothersome to patient. Not currently at goal.  Start clindamycin BP once daily to affected areas  Benzoyl peroxide can cause dryness and irritation of the skin. It can also bleach fabric. When used together with Aczone (dapsone) cream, it can stain the skin orange.   Clindamycin-Benzoyl Per, Refr, (DUAC) gel - face Apply to affected areas once daily  Seborrheic dermatitis Head - Anterior (Face)  Chronic condition with duration or expected duration over one year. Currently well-controlled.  Seborrheic Dermatitis  -  is a chronic persistent rash characterized by pinkness and scaling most commonly of the mid face but also can occur on the scalp (dandruff), ears; mid chest, mid back and groin.  It tends to be exacerbated by stress and cooler weather.  People who have neurologic disease may experience new onset or exacerbation of existing seborrheic dermatitis.  The condition is not curable but treatable and  can be controlled.  Well controlled on ketoconazole 2% cream  Related Medications ketoconazole (NIZORAL) 2 % cream Apply to crease of nose twice daily as needed for flake and itch.   Lentigines - Scattered tan macules - Due to sun exposure - Benign-appearing, observe - Recommend daily broad spectrum sunscreen SPF 30+ to sun-exposed areas, reapply every 2 hours as needed. - Call for any changes  Seborrheic Keratoses - Stuck-on, waxy, tan-brown papules and/or plaques  - Benign-appearing - Discussed benign etiology and prognosis. - Observe - Call for any changes  Melanocytic Nevi - Tan-brown and/or pink-flesh-colored symmetric macules and papules - Benign appearing on exam today - Observation - Call clinic for new or changing moles - Recommend daily use of broad spectrum spf 30+ sunscreen to sun-exposed areas.   Hemangiomas - Red papules - Discussed benign nature - Observe - Call for any changes  Actinic Damage - Chronic condition, secondary to cumulative UV/sun exposure - diffuse scaly erythematous macules with underlying dyspigmentation - Recommend daily broad spectrum sunscreen SPF 30+ to sun-exposed areas, reapply every 2 hours as needed.  - Staying in the shade or wearing long sleeves, sun glasses (UVA+UVB protection) and wide brim hats (4-inch brim around the entire circumference of the hat) are also recommended for sun protection.  - Call for new or changing lesions.  Skin cancer screening performed today.  Return in about 1 year (around 09/04/2022) for TBSE.  Graciella Belton, RMA, am acting as scribe for Forest Gleason, MD .  Documentation: I  have reviewed the above documentation for accuracy and completeness, and I agree with the above.  Forest Gleason, MD

## 2021-09-03 NOTE — Patient Instructions (Addendum)
Wound Care Instructions  Cleanse wound gently with soap and water once a day then pat dry with clean gauze. Apply a thing coat of Petrolatum (petroleum jelly, "Vaseline") over the wound (unless you have an allergy to this). We recommend that you use a new, sterile tube of Vaseline. Do not pick or remove scabs. Do not remove the yellow or white "healing tissue" from the base of the wound.  Cover the wound with fresh, clean, nonstick gauze and secure with paper tape. You may use Band-Aids in place of gauze and tape if the would is small enough, but would recommend trimming much of the tape off as there is often too much. Sometimes Band-Aids can irritate the skin.  You should call the office for your biopsy report after 1 week if you have not already been contacted.  If you experience any problems, such as abnormal amounts of bleeding, swelling, significant bruising, significant pain, or evidence of infection, please call the office immediately.  FOR ADULT SURGERY PATIENTS: If you need something for pain relief you may take 1 extra strength Tylenol (acetaminophen) AND 2 Ibuprofen ('200mg'$  each) together every 4 hours as needed for pain. (do not take these if you are allergic to them or if you have a reason you should not take them.) Typically, you may only need pain medication for 1 to 3 days.    Melanoma ABCDEs  Melanoma is the most dangerous type of skin cancer, and is the leading cause of death from skin disease.  You are more likely to develop melanoma if you: Have light-colored skin, light-colored eyes, or red or blond hair Spend a lot of time in the sun Tan regularly, either outdoors or in a tanning bed Have had blistering sunburns, especially during childhood Have a close family member who has had a melanoma Have atypical moles or large birthmarks  Early detection of melanoma is key since treatment is typically straightforward and cure rates are extremely high if we catch it early.   The  first sign of melanoma is often a change in a mole or a new dark spot.  The ABCDE system is a way of remembering the signs of melanoma.  A for asymmetry:  The two halves do not match. B for border:  The edges of the growth are irregular. C for color:  A mixture of colors are present instead of an even brown color. D for diameter:  Melanomas are usually (but not always) greater than 78m - the size of a pencil eraser. E for evolution:  The spot keeps changing in size, shape, and color.  Please check your skin once per month between visits. You can use a small mirror in front and a large mirror behind you to keep an eye on the back side or your body.   If you see any new or changing lesions before your next follow-up, please call to schedule a visit.  Please continue daily skin protection including broad spectrum sunscreen SPF 30+ to sun-exposed areas, reapplying every 2 hours as needed when you're outdoors.    Due to recent changes in healthcare laws, you may see results of your pathology and/or laboratory studies on MyChart before the doctors have had a chance to review them. We understand that in some cases there may be results that are confusing or concerning to you. Please understand that not all results are received at the same time and often the doctors may need to interpret multiple results in order to provide  with the best plan of care or course of treatment. Therefore, we ask that you please give us 2 business days to thoroughly review all your results before contacting the office for clarification. Should we see a critical lab result, you will be contacted sooner.   If You Need Anything After Your Visit  If you have any questions or concerns for your doctor, please call our main line at [phone] and press option 4 to reach your doctor's medical assistant. If no one answers, please leave a voicemail as directed and we will return your call as soon as possible. Messages left after 4  pm will be answered the following business day.   You may also send us a message via MyChart. We typically respond to MyChart messages within 1-2 business days.  For prescription refills, please ask your pharmacy to contact our office. Our fax number is [phone].  If you have an urgent issue when the clinic is closed that cannot wait until the next business day, you can page your doctor at the number below.    Please note that while we do our best to be available for urgent issues outside of office hours, we are not available 24/7.   If you have an urgent issue and are unable to reach us, you may choose to seek medical care at your doctor's office, retail clinic, urgent care center, or emergency room.  If you have a medical emergency, please immediately call 911 or go to the emergency department.  Pager Numbers  - Dr. Kowalski: [phone]  - Dr. Moye: [phone]  - Dr. Stewart: [phone]  In the event of inclement weather, please call our main line at [phone] for an update on the status of any delays or closures.  Dermatology Medication Tips: Please keep the boxes that topical medications come in in order to help keep track of the instructions about where and how to use these. Pharmacies typically print the medication instructions only on the boxes and not directly on the medication tubes.   If your medication is too expensive, please contact our office at [phone] option 4 or send us a message through MyChart.   We are unable to tell what your co-pay for medications will be in advance as this is different depending on your insurance coverage. However, we may be able to find a substitute medication at lower cost or fill out paperwork to get insurance to cover a needed medication.   If a prior authorization is required to get your medication covered by your insurance company, please allow us 1-2 business days to complete this process.  Drug prices often vary  depending on where the prescription is filled and some pharmacies may offer cheaper prices.  The website www.goodrx.com contains coupons for medications through different pharmacies. The prices here do not account for what the cost may be with help from insurance (it may be cheaper with your insurance), but the website can give you the price if you did not use any insurance.  - You can print the associated coupon and take it with your prescription to the pharmacy.  - You may also stop by our office during regular business hours and pick up a GoodRx coupon card.  - If you need your prescription sent electronically to a different pharmacy, notify our office through [facility] MyChart or by phone at [phone] option 4.     Si Usted Necesita Algo Despus de Su Visita  Tambin puede enviarnos un mensaje a travs   de MyChart. Por lo general respondemos a los mensajes de MyChart en el transcurso de 1 a 2 das hbiles.  Para renovar recetas, por favor pida a su farmacia que se ponga en contacto con nuestra oficina. Nuestro nmero de fax es el [phone].  Si tiene un asunto urgente cuando la clnica est cerrada y que no puede esperar hasta el siguiente da hbil, puede llamar/localizar a su doctor(a) al nmero que aparece a continuacin.   Por favor, tenga en cuenta que aunque hacemos todo lo posible para estar disponibles para asuntos urgentes fuera del horario de oficina, no estamos disponibles las 24 horas del da, los 7 das de la semana.   Si tiene un problema urgente y no puede comunicarse con nosotros, puede optar por buscar atencin mdica  en el consultorio de su doctor(a), en una clnica privada, en un centro de atencin urgente o en una sala de emergencias.  Si tiene una emergencia mdica, por favor llame inmediatamente al 911 o vaya a la sala de emergencias.  Nmeros de bper  - Dr. Kowalski: [phone]  - Dra. Moye: [phone]  - Dra. Stewart: [phone]  En caso de  inclemencias del tiempo, por favor llame a nuestra lnea principal al [phone] para una actualizacin sobre el estado de cualquier retraso o cierre.  Consejos para la medicacin en dermatologa: Por favor, guarde las cajas en las que vienen los medicamentos de uso tpico para ayudarle a seguir las instrucciones sobre dnde y cmo usarlos. Las farmacias generalmente imprimen las instrucciones del medicamento slo en las cajas y no directamente en los tubos del medicamento.   Si su medicamento es muy caro, por favor, pngase en contacto con nuestra oficina llamando al [phone] y presione la opcin 4 o envenos un mensaje a travs de MyChart.   No podemos decirle cul ser su copago por los medicamentos por adelantado ya que esto es diferente dependiendo de la cobertura de su seguro. Sin embargo, es posible que podamos encontrar un medicamento sustituto a menor costo o llenar un formulario para que el seguro cubra el medicamento que se considera necesario.   Si se requiere una autorizacin previa para que su compaa de seguros cubra su medicamento, por favor permtanos de 1 a 2 das hbiles para completar este proceso.  Los precios de los medicamentos varan con frecuencia dependiendo del lugar de dnde se surte la receta y alguna farmacias pueden ofrecer precios ms baratos.  El sitio web www.goodrx.com tiene cupones para medicamentos de diferentes farmacias. Los precios aqu no tienen en cuenta lo que podra costar con la ayuda del seguro (puede ser ms barato con su seguro), pero el sitio web puede darle el precio si no utiliz ningn seguro.  - Puede imprimir el cupn correspondiente y llevarlo con su receta a la farmacia.  - Tambin puede pasar por nuestra oficina durante el horario de atencin regular y recoger una tarjeta de cupones de GoodRx.  - Si necesita que su receta se enve electrnicamente a una farmacia diferente, informe a nuestra oficina a travs de MyChart de [facility] o  por telfono llamando al [phone] y presione la opcin 4.  

## 2021-09-06 ENCOUNTER — Other Ambulatory Visit: Payer: Self-pay | Admitting: Family Medicine

## 2021-09-06 DIAGNOSIS — F5104 Psychophysiologic insomnia: Secondary | ICD-10-CM

## 2021-09-09 ENCOUNTER — Encounter: Payer: Self-pay | Admitting: Dermatology

## 2021-09-10 ENCOUNTER — Telehealth: Payer: Self-pay

## 2021-09-10 NOTE — Telephone Encounter (Addendum)
  Tried calling regarding bx results. No answer. Unable to leave voicemail. Will try again at later time.    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 On the spectrum from normal mole to melanoma skin cancer, this is in between the two. - We need to recheck this area sometime in the next 6 months to be sure there is no evidence of the atypical mole coming back. If there is any color coming back, we would recommend repeating the biopsy to be sure the cells look normal.  - People who have a history of atypical moles do have a slightly increased risk of developing melanoma somewhere on the body, so a yearly full body skin exam by a dermatologist is recommended.  - Monthly self skin checks and daily sun protection are also recommended.  - Please call if you notice a dark spot coming back where this biopsy was taken.  - Please also call if you notice any new or changing spots anywhere else on the body before your follow-up visit.     MAs please call. Thank you!

## 2021-09-12 ENCOUNTER — Other Ambulatory Visit: Payer: Self-pay

## 2021-09-12 DIAGNOSIS — E669 Obesity, unspecified: Secondary | ICD-10-CM

## 2021-09-12 DIAGNOSIS — J309 Allergic rhinitis, unspecified: Secondary | ICD-10-CM

## 2021-09-12 DIAGNOSIS — F419 Anxiety disorder, unspecified: Secondary | ICD-10-CM

## 2021-09-12 DIAGNOSIS — E66811 Obesity, class 1: Secondary | ICD-10-CM

## 2021-09-12 DIAGNOSIS — B009 Herpesviral infection, unspecified: Secondary | ICD-10-CM

## 2021-09-12 DIAGNOSIS — K219 Gastro-esophageal reflux disease without esophagitis: Secondary | ICD-10-CM

## 2021-09-12 MED ORDER — VALACYCLOVIR HCL 500 MG PO TABS: 500.0000 mg | ORAL_TABLET | Freq: Every day | ORAL | 3 refills | Status: DC

## 2021-09-12 MED ORDER — LORATADINE 10 MG PO TABS: 10.0000 mg | ORAL_TABLET | Freq: Every day | ORAL | 3 refills | Status: DC

## 2021-09-12 MED ORDER — SEMAGLUTIDE-WEIGHT MANAGEMENT 2.4 MG/0.75ML ~~LOC~~ SOAJ: 2.4000 mg | SUBCUTANEOUS | 1 refills | Status: DC

## 2021-09-12 MED ORDER — SEMAGLUTIDE-WEIGHT MANAGEMENT 1 MG/0.5ML ~~LOC~~ SOAJ: 1.0000 mg | SUBCUTANEOUS | 0 refills | Status: DC

## 2021-09-12 MED ORDER — OMEPRAZOLE 20 MG PO CPDR: 20.0000 mg | DELAYED_RELEASE_CAPSULE | Freq: Every day | ORAL | 3 refills | Status: DC

## 2021-09-12 MED ORDER — DULOXETINE HCL 30 MG PO CPEP: ORAL_CAPSULE | ORAL | 1 refills | Status: DC

## 2021-09-12 MED ORDER — SEMAGLUTIDE-WEIGHT MANAGEMENT 1.7 MG/0.75ML ~~LOC~~ SOAJ: 1.7000 mg | SUBCUTANEOUS | 0 refills | Status: DC

## 2021-09-12 NOTE — Telephone Encounter (Signed)
Insurance purposes, patient had to change pharmacies to [facility], I sent all the medications except the Cymbalta, it is pended.  Amilyah Nack,cma

## 2021-09-12 NOTE — Telephone Encounter (Signed)
Patient states she has changed pharmacies.  Patient states she is changing her pharmacy from [facility] in [place], to [facility] near [address] in [place].  Patient states she needs a refill for her omeprazole (PRILOSEC) 20 MG capsule, and she would like for [place] to send it to the [facility] near [address].

## 2021-09-15 ENCOUNTER — Telehealth: Payer: Self-pay

## 2021-09-15 NOTE — Telephone Encounter (Signed)
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 On the spectrum from normal mole to melanoma skin cancer, this is in between the two. - We need to recheck this area sometime in the next 6 months to be sure there is no evidence of the atypical mole coming back. If there is any color coming back, we would recommend repeating the biopsy to be sure the cells look normal.  - People who have a history of atypical moles do have a slightly increased risk of developing melanoma somewhere on the body, so a yearly full body skin exam by a dermatologist is recommended.  - Monthly self skin checks and daily sun protection are also recommended.  - Please call if you notice a dark spot coming back where this biopsy was taken.  - Please also call if you notice any new or changing spots anywhere else on the body before your follow-up visit.     MAs please call. Thank you!

## 2021-09-17 ENCOUNTER — Telehealth: Payer: Self-pay | Admitting: Neurology

## 2021-09-17 DIAGNOSIS — G43009 Migraine without aura, not intractable, without status migrainosus: Secondary | ICD-10-CM

## 2021-09-17 DIAGNOSIS — G43909 Migraine, unspecified, not intractable, without status migrainosus: Secondary | ICD-10-CM

## 2021-09-17 DIAGNOSIS — G43709 Chronic migraine without aura, not intractable, without status migrainosus: Secondary | ICD-10-CM

## 2021-09-17 MED ORDER — DICLOFENAC POTASSIUM(MIGRAINE) 50 MG PO PACK: 50.0000 mg | PACK | Freq: Once | ORAL | 11 refills | Status: DC | PRN

## 2021-09-17 MED ORDER — DIVALPROEX SODIUM 500 MG PO DR TAB: DELAYED_RELEASE_TABLET | ORAL | 4 refills | Status: DC

## 2021-09-17 NOTE — Telephone Encounter (Signed)
Pt is requesting a refill for divalproex (DEPAKOTE) 500 MG DR tablet.  Pharmacy: [id number]  Cathren Harsh is to be mailed to pt

## 2021-09-17 NOTE — Telephone Encounter (Signed)
Refill for Depakote has been sent to local pharmacy and cambia has been sent to mail order ([facility]).

## 2021-09-23 ENCOUNTER — Telehealth: Payer: Self-pay | Admitting: *Deleted

## 2021-09-23 NOTE — Telephone Encounter (Signed)
Haivyn Oravec [facility]) PA Case  ID PA [id number]   PA Diclofenac Potassium complete waiting on approval

## 2021-09-24 ENCOUNTER — Telehealth: Payer: Self-pay

## 2021-09-24 MED ORDER — DICLOFENAC POTASSIUM 50 MG PO TABS
ORAL_TABLET | ORAL | 0 refills | Status: DC
Start: 2021-09-24 — End: 2021-10-23

## 2021-09-24 NOTE — Addendum Note (Signed)
Addended by: Trudie Buckler on: 09/24/2021 02:27 PM   Modules accepted: Orders

## 2021-09-24 NOTE — Telephone Encounter (Addendum)
  Tried calling patient regarding results. No answer unable to leave message. Reviewed patient's chart and seen that Patient has mychart and has reviewed results on mychart regarding results. Will recheck in 6 months.    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 On the spectrum from normal mole to melanoma skin cancer, this is in between the two. - We need to recheck this area sometime in the next 6 months to be sure there is no evidence of the atypical mole coming back. If there is any color coming back, we would recommend repeating the biopsy to be sure the cells look normal.  - People who have a history of atypical moles do have a slightly increased risk of developing melanoma somewhere on the body, so a yearly full body skin exam by a dermatologist is recommended.  - Monthly self skin checks and daily sun protection are also recommended.  - Please call if you notice a dark spot coming back where this biopsy was taken.  - Please also call if you notice any new or changing spots anywhere else on the body before your follow-up visit.     MAs please call. Thank you!

## 2021-09-28 ENCOUNTER — Other Ambulatory Visit: Payer: Self-pay | Admitting: Family Medicine

## 2021-09-28 DIAGNOSIS — B009 Herpesviral infection, unspecified: Secondary | ICD-10-CM

## 2021-10-08 ENCOUNTER — Telehealth: Payer: Self-pay | Admitting: Family Medicine

## 2021-10-08 DIAGNOSIS — F5104 Psychophysiologic insomnia: Secondary | ICD-10-CM

## 2021-10-08 MED ORDER — ZOLPIDEM TARTRATE ER 6.25 MG PO TBCR: EXTENDED_RELEASE_TABLET | ORAL | 1 refills | Status: DC

## 2021-10-08 NOTE — Telephone Encounter (Signed)
I called and cancelled the ambien at [facility]. Teagan Ozawa,cma

## 2021-10-08 NOTE — Telephone Encounter (Addendum)
Sent to pharmacy.  I initially sent it to the wrong pharmacy.  Please call [facility] and cancel the prescription for Ambien.  Please make sure to update the pharmacy when the patient requests a change.

## 2021-10-08 NOTE — Addendum Note (Signed)
Addended by: Leone Haven on: 10/08/2021 01:42 PM   Modules accepted: Orders

## 2021-10-08 NOTE — Telephone Encounter (Signed)
Pt need refill on zolpidem sent to [facility] on [address]. Pt has changed pharmacies

## 2021-10-23 ENCOUNTER — Ambulatory Visit: Payer: Managed Care, Other (non HMO) | Admitting: Neurology

## 2021-10-23 ENCOUNTER — Encounter: Payer: Self-pay | Admitting: Neurology

## 2021-10-23 VITALS — BP 136/89 | HR 96 | Ht 64.0 in | Wt 205.0 lb

## 2021-10-23 DIAGNOSIS — G43009 Migraine without aura, not intractable, without status migrainosus: Secondary | ICD-10-CM

## 2021-10-23 MED ORDER — UBRELVY 100 MG PO TABS: 100.0000 mg | ORAL_TABLET | ORAL | 11 refills | Status: DC | PRN

## 2021-10-23 MED ORDER — ELYXYB 120 MG/4.8ML PO SOLN: 120.0000 mg | Freq: Every day | ORAL | 11 refills | Status: DC | PRN

## 2021-10-23 MED ORDER — AMITRIPTYLINE HCL 50 MG PO TABS
ORAL_TABLET | ORAL | 4 refills | Status: DC
Start: 2021-10-23 — End: 2023-02-04

## 2021-10-23 MED ORDER — QULIPTA 60 MG PO TABS
60.0000 mg | ORAL_TABLET | Freq: Every day | ORAL | 11 refills | Status: DC
Start: 2021-10-23 — End: 2023-02-04

## 2021-10-23 MED ORDER — SUMATRIPTAN SUCCINATE 100 MG PO TABS: 100.0000 mg | ORAL_TABLET | Freq: Once | ORAL | 12 refills | Status: DC | PRN

## 2021-10-23 MED ORDER — CYCLOBENZAPRINE HCL 10 MG PO TABS: 10.0000 mg | ORAL_TABLET | Freq: Every day | ORAL | 3 refills | Status: DC

## 2021-10-23 MED ORDER — DIVALPROEX SODIUM 500 MG PO DR TAB: DELAYED_RELEASE_TABLET | ORAL | 4 refills | Status: DC

## 2021-10-23 NOTE — Patient Instructions (Addendum)
Daily: Quilipta(atogepant) - sent to [facility] Emergent/as needed Ubrelvy(ubrogepant sent to [facility]) and/or sumatriptan([facility]): Please take one tablet at the onset of your headache. If it does not improve the symptoms please take one additional tablet. Do not take more then 2 tablets in 24hrs.  Elyxyb: similar to cambia ([facility]) Continue Depakote and amitriptyline ([facility])   Ubrogepant Tablets What is this medication? UBROGEPANT (ue BROE je pant) treats migraines. It works by blocking a substance in the body that causes migraines. It is not used to prevent migraines. This medicine may be used for other purposes; ask your health care provider or pharmacist if you have questions. COMMON BRAND NAME(S): Margaret Silva What should I tell my care team before I take this medication? They need to know if you have any of these conditions: Kidney disease Liver disease An unusual or allergic reaction to ubrogepant, other medications, foods, dyes, or preservatives Pregnant or trying to get pregnant Breast-feeding How should I use this medication? Take this medication by mouth with a glass of water. Take it as directed on the prescription label. You can take it with or without food. If it upsets your stomach, take it with food. Keep taking it unless your care team tells you to stop. Talk to your care team about the use of this medication in children. Special care may be needed. Overdosage: If you think you have taken too much of this medicine contact a poison control center or emergency room at once. NOTE: This medicine is only for you. Do not share this medicine with others. What if I miss a dose? This does not apply. This medication is not for regular use. What may interact with this medication? Do not take this medication with any of the following: Adagrasib Ceritinib Certain antibiotics, such as chloramphenicol, clarithromycin, telithromycin Certain antivirals for HIV, such as atazanavir,  cobicistat, darunavir, delavirdine, fosamprenavir, indinavir, ritonavir Certain medications for fungal infections, such as itraconazole, ketoconazole, posaconazole, voriconazole Conivaptan Grapefruit Idelalisib Mifepristone Nefazodone Ribociclib This medication may also interact with the following: Carvedilol Certain medications for seizures, such as phenobarbital, phenytoin Ciprofloxacin Cyclosporine Eltrombopag Fluconazole Fluvoxamine Quinidine Rifampin St. John's wort Verapamil This list may not describe all possible interactions. Give your health care provider a list of all the medicines, herbs, non-prescription drugs, or dietary supplements you use. Also tell them if you smoke, drink alcohol, or use illegal drugs. Some items may interact with your medicine. What should I watch for while using this medication? Visit your care team for regular checks on your progress. Tell your care team if your symptoms do not start to get better or if they get worse. Your mouth may get dry. Chewing sugarless gum or sucking hard candy and drinking plenty of water may help. Contact your care team if the problem does not go away or is severe. What side effects may I notice from receiving this medication? Side effects that you should report to your care team as soon as possible: Allergic reactions--skin rash, itching, hives, swelling of the face, lips, tongue, or throat Side effects that usually do not require medical attention (report to your care team if they continue or are bothersome): Drowsiness Dry mouth Fatigue Nausea This list may not describe all possible side effects. Call your doctor for medical advice about side effects. You may report side effects to FDA at 1-800-FDA-1088. Where should I keep my medication? Keep out of the reach of children and pets. Store between 15 and 30 degrees C (59 and 86 degrees F). Get  rid of any unused medication after the expiration date. To get rid of  medications that are no longer needed or have expired: Take the medication to a medication take-back program. Check with your pharmacy or law enforcement to find a location. If you cannot return the medication, check the label or package insert to see if the medication should be thrown out in the garbage or flushed down the toilet. If you are not sure, ask your care team. If it is safe to put it in the trash, pour the medication out of the container. Mix the medication with cat litter, dirt, coffee grounds, or other unwanted substance. Seal the mixture in a bag or container. Put it in the trash. NOTE: This sheet is a summary. It may not cover all possible information. If you have questions about this medicine, talk to your doctor, pharmacist, or health care provider.  2023 Elsevier/Gold Standard (2021-02-14 00:00:00) Atogepant Tablets What is this medication? ATOGEPANT (a TOE je pant) prevents migraines. It works by blocking a substance in the body that causes migraines. This medicine may be used for other purposes; ask your health care provider or pharmacist if you have questions. COMMON BRAND NAME(S): QULIPTA What should I tell my care team before I take this medication? They need to know if you have any of these conditions: Kidney disease Liver disease An unusual or allergic reaction to atogepant, other medications, foods, dyes, or preservatives Pregnant or trying to get pregnant Breast-feeding How should I use this medication? Take this medication by mouth with water. Take it as directed on the prescription label at the same time every day. You can take it with or without food. If it upsets your stomach, take it with food. Keep taking it unless your care team tells you to stop. Talk to your care team about the use of this medication in children. Special care may be needed. Overdosage: If you think you have taken too much of this medicine contact a poison control center or emergency room at  once. NOTE: This medicine is only for you. Do not share this medicine with others. What if I miss a dose? If you miss a dose, take it as soon as you can. If it is almost time for your next dose, take only that dose. Do not take double or extra doses. What may interact with this medication? Carbamazepine Certain medications for fungal infections, such as itraconazole, ketoconazole Clarithromycin Cyclosporine Efavirenz Etravirine Phenytoin Rifampin St. John's wort This list may not describe all possible interactions. Give your health care provider a list of all the medicines, herbs, non-prescription drugs, or dietary supplements you use. Also tell them if you smoke, drink alcohol, or use illegal drugs. Some items may interact with your medicine. What should I watch for while using this medication? Visit your care team for regular checks on your progress. Tell your care team if your symptoms do not start to get better or if they get worse. What side effects may I notice from receiving this medication? Side effects that you should report to your care team as soon as possible: Allergic reactions--skin rash, itching, hives, swelling of the face, lips, tongue, or throat Side effects that usually do not require medical attention (report to your care team if they continue or are bothersome): Constipation Fatigue Loss of appetite with weight loss Nausea This list may not describe all possible side effects. Call your doctor for medical advice about side effects. You may report side effects to FDA  at 1-800-FDA-1088. Where should I keep my medication? Keep out of the reach of children and pets. Store at room temperature between 20 and 25 degrees C (68 and 77 degrees F). Get rid of any unused medication after the expiration date. To get rid of medications that are no longer needed or have expired: Take the medication to a medication take-back program. Check with your pharmacy or law enforcement to  find a location. If you cannot return the medication, check the label or package insert to see if the medication should be thrown out in the garbage or flushed down the toilet. If you are not sure, ask your care team. If it is safe to put it in the trash, take the medication out of the container. Mix the medication with cat litter, dirt, coffee grounds, or other unwanted substance. Seal the mixture in a bag or container. Put it in the trash. NOTE: This sheet is a summary. It may not cover all possible information. If you have questions about this medicine, talk to your doctor, pharmacist, or health care provider.  2023 Elsevier/Gold Standard (2021-03-24 00:00:00) Celecoxib Solution What is this medication? CELECOXIB (sell a KOX ib) treats mild to moderate pain, inflammation, or arthritis. It belongs to a group of medications called NSAIDs. This medicine may be used for other purposes; ask your health care provider or pharmacist if you have questions. COMMON BRAND NAME(S): [id number] What should I tell my care team before I take this medication? They need to know if you have any of these conditions: Cigarette smoker Coronary artery bypass graft (CABG) surgery within the past 2 weeks Drink more than 3 alcohol-containing drinks a day Heart disease High blood pressure History of stomach bleeding Kidney disease Liver disease Lung or breathing disease, like asthma An unusual or allergic reaction to celecoxib, sulfa medications, aspirin, other NSAIDs, other medications, foods, dyes, or preservatives Pregnant or trying to get pregnant Breast-feeding How should I use this medication? Take this medication by mouth. Follow the directions on the prescription label. Use a specially marked oral syringe, spoon, or dropper to measure each dose. Ask your pharmacist if you do not have one. Household spoons are not accurate. A special MedGuide will be given to you by the pharmacist with each prescription and  refill. Be sure to read this information carefully each time. Talk to your care team regarding the use of this medication in children. Special care may be needed. Overdosage: If you think you have taken too much of this medicine contact a poison control center or emergency room at once. NOTE: This medicine is only for you. Do not share this medicine with others. What if I miss a dose? This does not apply. This medication is not for regular use. What may interact with this medication? Do not take this medication with any of the following: Cidofovir Ketorolac Thioridazine This medication may also interact with the following: Alcohol Aspirin and aspirin-like medications Atomoxetine Certain medications for blood pressure, heart disease, irregular heart beat Certain medications for depression, anxiety, or psychotic disorders Certain medications that treat or prevent blood clots like warfarin, enoxaparin, dalteparin, apixaban, dabigatran, and rivaroxaban Cyclosporine Digoxin Diuretics Fluconazole Lithium Methotrexate Other NSAIDs, medications for pain and inflammation, like ibuprofen or naproxen Pemetrexed Rifampin Steroid medications like prednisone or cortisone This list may not describe all possible interactions. Give your health care provider a list of all the medicines, herbs, non-prescription drugs, or dietary supplements you use. Also tell them if you smoke, drink alcohol, or  use illegal drugs. Some items may interact with your medicine. What should I watch for while using this medication? Visit your care team for regular checks on your progress. Tell your care team if your symptoms do not start to get better or if they get worse. Do not take other medications that contain aspirin, ibuprofen, or naproxen with this medication. Side effects such as stomach upset, nausea, or ulcers may be more likely to occur. Many non-prescription medications contain aspirin, ibuprofen, or naproxen.  Always read labels carefully. This medication can cause serious ulcers and bleeding in the stomach. It can happen with no warning. Smoking, drinking alcohol, older age, and poor health can also increase risks. Call your care team right away if you have stomach pain or blood in your vomit or stool. This medication does not prevent a heart attack or stroke. This medication may increase the chance of a heart attack or stroke. The chance may increase the longer you use this medication or if you have heart disease. If you take aspirin to prevent a heart attack or stroke, talk to your care team about using this medication. Alcohol may interfere with the effect of this medication. Avoid alcoholic drinks. This medication may cause serious skin reactions. They can happen weeks to months after starting the medication. Contact your care team right away if you notice fevers or flu-like symptoms with a rash. The rash may be red or purple and then turn into blisters or peeling of the skin. Or, you might notice a red rash with swelling of the face, lips or lymph nodes in your neck or under your arms. Talk to your care team if you are pregnant before taking this medication. Taking this medication between weeks 20 and 30 of pregnancy may harm your unborn baby. Your care team will monitor you closely if you need to take it. After 30 weeks of pregnancy, do not take this medication. You may get drowsy or dizzy. Do not drive, use machinery, or do anything that needs mental alertness until you know how this medication affects you. Do not stand up or sit up quickly, especially if you are an older patient. This reduces the risk of dizzy or fainting spells. Be careful brushing or flossing your teeth or using a toothpick because you may get an infection or bleed more easily. If you have any dental work done, tell your dentist you are receiving this medication. This medication may make it more difficult to get pregnant. Talk to your  care team if you are concerned about your fertility. What side effects may I notice from receiving this medication? Side effects that you should report to your care team as soon as possible: Allergic reactions--skin rash, itching, hives, swelling of the face, lips, tongue, or throat Bleeding--bloody or black, tar-like stools, vomiting blood or brown material that looks like coffee grounds, red or dark brown urine, small red or purple spots on skin, unusual bruising or bleeding Heart attack--pain or tightness in the chest, shoulders, arms, or jaw, nausea, shortness of breath, cold or clammy skin, feeling faint or lightheaded Heart failure--shortness of breath, swelling of ankles, feet, or hands, sudden weight gain, unusual weakness or fatigue Increase in blood pressure Kidney injury--decrease in the amount of urine, swelling of the ankles, hands, or feet Liver injury--right upper belly pain, loss of appetite, nausea, light-colored stool, dark yellow or brown urine, yellowing skin or eyes, unusual weakness or fatigue Rash, fever, and swollen lymph nodes Redness, blistering, peeling, or loosening  of the skin, including inside the mouth Stroke--sudden numbness or weakness of the face, arm, or leg, trouble speaking, confusion, trouble walking, loss of balance or coordination, dizziness, severe headache, change in vision Side effects that usually do not require medical attention (report to your care team if they continue or are bothersome): Change in taste Headache Loss of appetite Nausea Upset stomach This list may not describe all possible side effects. Call your doctor for medical advice about side effects. You may report side effects to FDA at 1-800-FDA-1088. Where should I keep my medication? Keep out of the reach of children and pets. Store at room temperature between 15 and 30 degrees C (59 and 86 degrees F). Get rid of any unused medication after the expiration date. To get rid of  medications that are no longer needed or have expired: Take the medication to a medication take-back program. Check with your pharmacy or law enforcement to find a location. If you cannot return the medication, check the label or package insert to see if the medication should be thrown out in the garbage or flushed down the toilet. If you are not sure, ask your care team. If it is safe to put it in the trash, empty the medication out of the container. Mix the medication with cat litter, dirt, coffee grounds, or other unwanted substance. Seal the mixture in a bag or container. Put it in the trash. NOTE: This sheet is a summary. It may not cover all possible information. If you have questions about this medicine, talk to your doctor, pharmacist, or health care provider.  2023 Elsevier/Gold Standard (2020-02-08 00:00:00) Celecoxib Solution What is this medication? CELECOXIB (sell a KOX ib) treats mild to moderate pain, inflammation, or arthritis. It belongs to a group of medications called NSAIDs. This medicine may be used for other purposes; ask your health care provider or pharmacist if you have questions. COMMON BRAND NAME(S): WCHENI What should I tell my care team before I take this medication? They need to know if you have any of these conditions: Cigarette smoker Coronary artery bypass graft (CABG) surgery within the past 2 weeks Drink more than 3 alcohol-containing drinks a day Heart disease High blood pressure History of stomach bleeding Kidney disease Liver disease Lung or breathing disease, like asthma An unusual or allergic reaction to celecoxib, sulfa medications, aspirin, other NSAIDs, other medications, foods, dyes, or preservatives Pregnant or trying to get pregnant Breast-feeding How should I use this medication? Take this medication by mouth. Follow the directions on the prescription label. Use a specially marked oral syringe, spoon, or dropper to measure each dose. Ask your  pharmacist if you do not have one. Household spoons are not accurate. A special MedGuide will be given to you by the pharmacist with each prescription and refill. Be sure to read this information carefully each time. Talk to your care team regarding the use of this medication in children. Special care may be needed. Overdosage: If you think you have taken too much of this medicine contact a poison control center or emergency room at once. NOTE: This medicine is only for you. Do not share this medicine with others. What if I miss a dose? This does not apply. This medication is not for regular use. What may interact with this medication? Do not take this medication with any of the following: Cidofovir Ketorolac Thioridazine This medication may also interact with the following: Alcohol Aspirin and aspirin-like medications Atomoxetine Certain medications for blood pressure, heart disease,  irregular heart beat Certain medications for depression, anxiety, or psychotic disorders Certain medications that treat or prevent blood clots like warfarin, enoxaparin, dalteparin, apixaban, dabigatran, and rivaroxaban Cyclosporine Digoxin Diuretics Fluconazole Lithium Methotrexate Other NSAIDs, medications for pain and inflammation, like ibuprofen or naproxen Pemetrexed Rifampin Steroid medications like prednisone or cortisone This list may not describe all possible interactions. Give your health care provider a list of all the medicines, herbs, non-prescription drugs, or dietary supplements you use. Also tell them if you smoke, drink alcohol, or use illegal drugs. Some items may interact with your medicine. What should I watch for while using this medication? Visit your care team for regular checks on your progress. Tell your care team if your symptoms do not start to get better or if they get worse. Do not take other medications that contain aspirin, ibuprofen, or naproxen with this medication. Side  effects such as stomach upset, nausea, or ulcers may be more likely to occur. Many non-prescription medications contain aspirin, ibuprofen, or naproxen. Always read labels carefully. This medication can cause serious ulcers and bleeding in the stomach. It can happen with no warning. Smoking, drinking alcohol, older age, and poor health can also increase risks. Call your care team right away if you have stomach pain or blood in your vomit or stool. This medication does not prevent a heart attack or stroke. This medication may increase the chance of a heart attack or stroke. The chance may increase the longer you use this medication or if you have heart disease. If you take aspirin to prevent a heart attack or stroke, talk to your care team about using this medication. Alcohol may interfere with the effect of this medication. Avoid alcoholic drinks. This medication may cause serious skin reactions. They can happen weeks to months after starting the medication. Contact your care team right away if you notice fevers or flu-like symptoms with a rash. The rash may be red or purple and then turn into blisters or peeling of the skin. Or, you might notice a red rash with swelling of the face, lips or lymph nodes in your neck or under your arms. Talk to your care team if you are pregnant before taking this medication. Taking this medication between weeks 20 and 30 of pregnancy may harm your unborn baby. Your care team will monitor you closely if you need to take it. After 30 weeks of pregnancy, do not take this medication. You may get drowsy or dizzy. Do not drive, use machinery, or do anything that needs mental alertness until you know how this medication affects you. Do not stand up or sit up quickly, especially if you are an older patient. This reduces the risk of dizzy or fainting spells. Be careful brushing or flossing your teeth or using a toothpick because you may get an infection or bleed more easily. If you  have any dental work done, tell your dentist you are receiving this medication. This medication may make it more difficult to get pregnant. Talk to your care team if you are concerned about your fertility. What side effects may I notice from receiving this medication? Side effects that you should report to your care team as soon as possible: Allergic reactions--skin rash, itching, hives, swelling of the face, lips, tongue, or throat Bleeding--bloody or black, tar-like stools, vomiting blood or brown material that looks like coffee grounds, red or dark brown urine, small red or purple spots on skin, unusual bruising or bleeding Heart attack--pain or tightness in  the chest, shoulders, arms, or jaw, nausea, shortness of breath, cold or clammy skin, feeling faint or lightheaded Heart failure--shortness of breath, swelling of ankles, feet, or hands, sudden weight gain, unusual weakness or fatigue Increase in blood pressure Kidney injury--decrease in the amount of urine, swelling of the ankles, hands, or feet Liver injury--right upper belly pain, loss of appetite, nausea, light-colored stool, dark yellow or brown urine, yellowing skin or eyes, unusual weakness or fatigue Rash, fever, and swollen lymph nodes Redness, blistering, peeling, or loosening of the skin, including inside the mouth Stroke--sudden numbness or weakness of the face, arm, or leg, trouble speaking, confusion, trouble walking, loss of balance or coordination, dizziness, severe headache, change in vision Side effects that usually do not require medical attention (report to your care team if they continue or are bothersome): Change in taste Headache Loss of appetite Nausea Upset stomach This list may not describe all possible side effects. Call your doctor for medical advice about side effects. You may report side effects to FDA at 1-800-FDA-1088. Where should I keep my medication? Keep out of the reach of children and pets. Store at  room temperature between 15 and 30 degrees C (59 and 86 degrees F). Get rid of any unused medication after the expiration date. To get rid of medications that are no longer needed or have expired: Take the medication to a medication take-back program. Check with your pharmacy or law enforcement to find a location. If you cannot return the medication, check the label or package insert to see if the medication should be thrown out in the garbage or flushed down the toilet. If you are not sure, ask your care team. If it is safe to put it in the trash, empty the medication out of the container. Mix the medication with cat litter, dirt, coffee grounds, or other unwanted substance. Seal the mixture in a bag or container. Put it in the trash. NOTE: This sheet is a summary. It may not cover all possible information. If you have questions about this medicine, talk to your doctor, pharmacist, or health care provider.  2023 Elsevier/Gold Standard (2020-02-08 00:00:00)

## 2021-10-23 NOTE — Progress Notes (Signed)
[facility]    Provider:  Dr Jaynee Eagles Referring Provider: Leone Haven, MD Primary Care Physician:  Leone Haven, MD  CC:  Headaches and migraines  10/23/2021: on average she has 4-7 migraine days a month and <12 total headache days a month. She has a lot of neck pain. Discussed options. Decided:  Daily: Quilipta(atogepant) - sent to [facility]gent/as needed Ubrelvy(ubrogepant sent to myscripts) and/or [facility]atriptan([facility]): Please take one tablet at the onset of your headache. If it does not improve the symptoms please take one additional tablet. Do not take more then 2 tablets in 24hrs.  Elyxyb: similar to cambia ([facility]) Continue Depakote and amitriptyline ([facility])  Meds tried: depakote, topamax, notriptyline, cambia, botox, propranolol, amitriptyline, cymbalta, sumatriptan, maxalt, celebrex, cambia, ibuprofen, tylenol, celebrex, flexeril, diclofenac, celecoxyb tab, naproxyn , verapamil, indomethacin.  Patient complains of symptoms per HPI as well as the following symptoms: migraines . Pertinent negatives and positives per HPI. All others negative   Interval history 11/27/2019:  She is doing well, she is working from home, she moved into her new home in January this year. When she has the migraines she starts with excedrin and then she goes to the [place]. Otherwise perfectly stable  Interval history 11/28/2018: She is here for follow up of migranies. Migraines have been better. She has switched jobs. She is building a house. Switched jobs, she is at [place] are better. Only one a month and cambia helps.    Interval history: Tried botox and had reactions(difficulty swallowing, shortness of breath, sore throat, pulse in the 100's , cough), tried Depakote in the past (she cannot get pregnant), She has used onzetra in the past and worked well and cambia works well.  She has a new job. She is still on Depakote. Migraines are still ongoing. She  would like a screen protector note for her computer bc it is causing her migraines. Her migraines are improved. 4 migraine days a month treated with excedrin and sometimes cambia. Continue the Cambia and try to get the [place]. Refill medications.   HPI:  Margaret Silva is a 41 y.o. female here as a referral from Dr. Caryl Bis for headaches. Past medical history migraines, fibromyalgia, interstitial cystitis, diverticulitis, endometriosis, ADHD, dyslexia, restless legs, bursitis, plantar fasciitis, IBS. Migraines Since a teenager. She is on Cymbalta which helps. She works around a lot of dyes which have exacerbated her migraines. Migraines are on the right side. It is sharp, pounding, going up the neck on the back, sometimes the other side. Lots of sound and light sensitivity. Lots of nausea. Occurs several times a week, lasts all day. She has headaches every day, 30 headache days a month, At least 8 are bad migraine days. Has tried TCA, verapamil,propanolol. She is in the lab now which may be better as less triggers. Cymbalta was just increased. She takes Tylenol 3-4x a week only, no medication overuse . Shutting herslef off in a dark room helps. No aura. Computer overuse makes it worse. She takes nothing for the nausea. Sumatriptan used to work not anymore. Zomig nasal and the cambia helped. Sometimes the migraines are immediate but usually a more gradual increase in severity. The low-level headaches are usually as she go to work every day. The headaches are at the base of the skull, all day long, 1-2 level, She does not take the robaxin. No other focal neurologic deficits or complaints.   Reviewed notes, labs and imaging from outside physicians, which showed: Reviewed notes from  [facility]. Patient has fibromyalgia syndrome. Fibromyalgia has been active. Her generalized pain scale of 0-10 has been about 6-8 in her fatigue has been about 7. She has plantar fasciitis in her left  foot going for a few months now. She had cortisone injections, podiatrist. She also had physical therapy which she has not done yet that she has been doing some splinting at night also some exercises without much result. She went on a mission trip to [place] and since then has been having pain in her left hip and bilateral knee joints without joint swelling. She has a history of morning stiffness, discomfort walking and doing routine activities. She is 16 out of 18 points positive on her fibromyalgia. Her fibromyalgia is active with generalized pain and positive tender points. She has mild osteoarthritis. She is overweight. Weight loss, diet, exercise and muscle strengthening exercises were discussed. IT band exercises and muscle strengthening exercises were discussed. She was referred to physical therapy for plantar fasciitis and left trochanteric bursitis. She also has insomnia, history of IBS, bilateral trapezius muscle spasms.  Reviewed image reports. X-rays of left hip joint 2 views, were within normal limits. X-rays of bilateral knee joints, 2 views, showed mild medial compartment narrowing and some lateral osteophytes with mild patellofemoral narrowing without any chondrocalcinosis.  Reviewed lab reports, CMP was normal with normal creatinine 0.94. This was taken on January 2016.  Cholesterol high T10, LDL elevated at 133, TSH within normal limits, CBC normal.  Review of Systems: Patient complains of symptoms per HPI as well as the following symptoms: none . Pertinent negatives and positives per HPI. All others negative    Social History   Socioeconomic History   Marital status: Divorced    Spouse name: Not on file   Number of children: 0   Years of education: 12    Highest education level: Not on file  Occupational History   Occupation: [employer]   Tobacco Use   Smoking status: Never    Passive exposure: Past   Smokeless tobacco: Never  Vaping Use   Vaping Use: Never  used  Substance and Sexual Activity   Alcohol use: Yes    Alcohol/week: 0.0 standard drinks of alcohol    Comment: occasionally   Drug use: No   Sexual activity: Yes    Partners: Male  Other Topics Concern   Not on file  Social History Narrative   Lives with dog   Caffeine use:  coffee on occasion maybe once a week   Right handed   Social Determinants of Health   Financial Resource Strain: Not on file  Food Insecurity: Not on file  Transportation Needs: Not on file  Physical Activity: Not on file  Stress: Not on file  Social Connections: Not on file  Intimate Partner Violence: Not on file    Family History  Problem Relation Age of Onset   Fibromyalgia Mother    Migraines Mother    Cancer Mother 31       breast   Breast cancer Mother    Hypertension Father    Hyperlipidemia Father    Prostate cancer Father    Ehlers-Danlos syndrome Sister    Fibromyalgia Sister    Thyroid cancer Sister    Diabetes Maternal Grandmother    Arthritis Maternal Grandmother    Kidney disease Maternal Grandmother    Heart attack Maternal Grandfather    Heart disease Maternal Grandfather    Hypertension Maternal Grandfather  Mental illness Maternal Grandfather    Cancer Paternal Grandmother        Liver cancer/lung cancer   Diabetes Paternal Grandmother    Heart attack Paternal Grandfather    Cancer Paternal Grandfather        Pancreatic/ Bladder Cancer/prostate   Arthritis Paternal Grandfather    Heart disease Paternal Grandfather    Ovarian cancer Neg Hx    Colon cancer Neg Hx     Past Medical History:  Diagnosis Date   Asthma    Bursitis of left hip    Diverticulitis    h/o   Dysplastic nevus 09/03/2021   right lateral knee, moderate   Endometriosis    GERD (gastroesophageal reflux disease)    Headache    History of chicken pox    History of colonoscopy 11/04 and April 06   History of laparoscopy 12/04 and 12/07   History of laparoscopy 01/29/2006   Dr.  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    Past Surgical History:  Procedure Laterality Date   CYSTOSCOPY WITH HYDRODISTENSION AND BIOPSY     DIAGNOSTIC LAPAROSCOPY  02/16/2003   ESOPHAGOGASTRODUODENOSCOPY (EGD) WITH PROPOFOL N/A 09/30/2018   Procedure: ESOPHAGOGASTRODUODENOSCOPY (EGD) WITH PROPOFOL;  Surgeon: Virgel Manifold, MD;  Location: [facility] ENDOSCOPY;  Service: Endoscopy;  Laterality: N/A;   SIGMOIDOSCOPY  05/26/10    Current Outpatient Medications  Medication Sig Dispense Refill   albuterol (VENTOLIN HFA) 108 (90 Base) MCG/ACT inhaler Inhale 2 puffs into the lungs every 4 (four) hours as needed for wheezing or shortness of breath. 18 each 0   Atogepant (QULIPTA) 60 MG TABS Take 60 mg by mouth daily. 30 tablet 11   Azelastine-Fluticasone 137-50 MCG/ACT SUSP Place 1 spray into both nostrils 2 (two) times daily.     Celecoxib (ELYXYB) 120 MG/4.8ML SOLN Take 120 mg by mouth daily as needed. 28.8 mL 11   Cholecalciferol (VITAMIN D PO) Take 5,000 Int'l Units by mouth once a week.     Clindamycin-Benzoyl Per, Refr, (DUAC) gel Apply to affected areas once daily 45 g 2   cyclobenzaprine (FLEXERIL) 10 MG tablet Take 1 tablet (10 mg total) by mouth at bedtime. 90 tablet 3   diclofenac sodium (VOLTAREN) 1 % GEL Apply 4 g topically 3 (three) times daily as needed.     DULoxetine (CYMBALTA) 30 MG capsule TAKE THREE CAPSULES PO DAILY 270 capsule 1   EPINEPHrine 0.3 mg/0.3 mL IJ SOAJ injection Inject into the muscle.     fluticasone (FLONASE) 50 MCG/ACT nasal spray Place 2 sprays into both nostrils daily. 16 g 6   gentamicin cream (GARAMYCIN) 0.1 % Apply 1 application topically as needed  (for nose).     ketoconazole (NIZORAL) 2 % cream Apply to crease of nose twice daily as needed for flake and itch. 60 g 3   loratadine (CLARITIN) 10 MG tablet Take 1 tablet (10 mg total) by mouth daily. 90 tablet 3   methylPREDNISolone Acetate (DEPO-MEDROL IJ) Inject as directed every 3 (three) months. First injection June 2023     omeprazole (PRILOSEC) 20 MG capsule Take 1 capsule (20 mg total) by mouth daily. [place]  capsule 3   ondansetron (ZOFRAN) 4 MG tablet Take 1 tablet (4 mg total) by mouth every 6 (six) hours. 30 tablet 0   ondansetron (ZOFRAN-ODT) 4 MG disintegrating tablet Take 1 tablet (4 mg total) by mouth every 8 (eight) hours as needed for nausea. 30 tablet 12   SUMAtriptan (IMITREX) 100 MG tablet Take 1 tablet (100 mg total) by mouth once as needed for up to 1 dose. May repeat in 2 hours if headache persists or recurs. 10 tablet 12   Ubrogepant (UBRELVY) 100 MG TABS Take 100 mg by mouth every 2 (two) hours as needed. Maximum '200mg'$  a day. 16 tablet 11   valACYclovir (VALTREX) 500 MG tablet TAKE 1 TABLET BY MOUTH EVERY DAY 30 tablet 3   zolpidem (AMBIEN CR) 6.25 MG CR tablet TAKE 1 TABLET BY MOUTH AT BEDTIME AS NEEDED FOR SLEEP 30 tablet 1   amitriptyline (ELAVIL) 50 MG tablet TAKE ONE TABLET (50 MG) BY MOUTH AT BEDTIME 90 tablet 4   divalproex (DEPAKOTE) 500 MG DR tablet TAKE ONE TABLET (500 MG) BY MOUTH EVERY DAY 90 tablet 4   No current facility-administered medications for this visit.    Allergies as of 10/23/2021 - Review Complete 10/23/2021  Allergen Reaction Noted   Coffee bean extract  [coffea arabica] Nausea Only 09/04/2014   Other  08/12/2015   Trazodone and nefazodone Other (See Comments) 08/03/2013    Vitals: BP 136/89 (BP Location: Right Arm, Patient Position: Sitting)   Pulse 96   Ht '5\' 4"'$  (1.626 m)   Wt 205 lb (93 kg)   BMI 35.19 kg/m  Last Weight:  Wt Readings from Last 1 Encounters:  10/23/21 205 lb (93 kg)   Last Height:   Ht Readings from Last 1  Encounters:  10/23/21 '5\' 4"'$  (1.626 m)   Exam: NAD, pleasant                  Speech:    Speech is normal; fluent and spontaneous with normal comprehension.  Cognition:    The patient is oriented to person, place, and time;     recent and remote memory intact;     language fluent;    Cranial Nerves:    The pupils are equal, round, and reactive to light.Trigeminal sensation is intact and the muscles of mastication are normal. The face is symmetric. The palate elevates in the midline. Hearing intact. Voice is normal. Shoulder shrug is normal. The tongue has normal motion without fasciculations.   Coordination:  No dysmetria  Motor Observation:    No asymmetry, no atrophy, and no involuntary movements noted. Tone:    Normal muscle tone.     Strength:    Strength is V/V in the upper and lower limbs.      Sensation: intact to LT          Assessment/Plan:  41 year-old with chronic migraines, non-intractable, without status migrainosus, without aura.  Daily: Quilipta(atogepant) - sent to [facility]/as needed Ubrelvy(ubrogepant sent to [facility]) and/or [facility]riptan([facility]): Please take one tablet at the onset of your headache. If it does not improve the symptoms please take one additional tablet. Do not take more then 2 tablets in 24hrs.  Elyxyb: similar to cambia ([facility]) Continue Depakote and amitriptyline ([facility])  Meds tried: depakote, topamax, notriptyline, cambia, botox, propranolol, amitriptyline, cymbalta, sumatriptan, maxalt, celebrex, cambia, ibuprofen, tylenol, celebrex, flexeril, diclofenac, celecoxyb tab, naproxyn , verapamil, indomethacin.  Meds ordered this encounter  Medications   Ubrogepant (UBRELVY) 100 MG  TABS    Sig: Take 100 mg by mouth every 2 (two) hours as needed. Maximum '200mg'$  a day.    Dispense:  16 tablet    Refill:  11    4-7 migraine days a month and <12 total headache days a month. Meds tried: depakote, topamax, notriptyline, cambia,  botox, propranolol, amitriptyline, cymbalta, sumatriptan, maxalt, celebrex, cambia,   Atogepant (QULIPTA) 60 MG TABS    Sig: Take 60 mg by mouth daily.    Dispense:  30 tablet    Refill:  11    4-7 migraine days a month and <12 total headache days a month. Meds tried: depakote, topamax, notriptyline, cambia, botox, propranolol, amitriptyline, cymbalta, sumatriptan, maxalt, celebrex, cambia,   Celecoxib (ELYXYB) 120 MG/4.8ML SOLN    Sig: Take 120 mg by mouth daily as needed.    Dispense:  28.8 mL    Refill:  11   SUMAtriptan (IMITREX) 100 MG tablet    Sig: Take 1 tablet (100 mg total) by mouth once as needed for up to 1 dose. May repeat in 2 hours if headache persists or recurs.    Dispense:  10 tablet    Refill:  12   divalproex (DEPAKOTE) 500 MG DR tablet    Sig: TAKE ONE TABLET (500 MG) BY MOUTH EVERY DAY    Dispense:  90 tablet    Refill:  4   amitriptyline (ELAVIL) 50 MG tablet    Sig: TAKE ONE TABLET (50 MG) BY MOUTH AT BEDTIME    Dispense:  90 tablet    Refill:  4   cyclobenzaprine (FLEXERIL) 10 MG tablet    Sig: Take 1 tablet (10 mg total) by mouth at bedtime.    Dispense:  90 tablet    Refill:  3     To prevent or relieve headaches, try the following: Cool Compress. Lie down and place a cool compress on your head.  Avoid headache triggers. If certain foods or odors seem to have triggered your migraines in the past, avoid them. A headache diary might help you identify triggers.  Include physical activity in your daily routine. Try a daily walk or other moderate aerobic exercise.  Manage stress. Find healthy ways to cope with the stressors, such as delegating tasks on your to-do list.  Practice relaxation techniques. Try deep breathing, yoga, massage and visualization.  Eat regularly. Eating regularly scheduled meals and maintaining a healthy diet might help prevent headaches. Also, drink plenty of fluids.  Follow a regular sleep schedule. Sleep deprivation might contribute  to headaches Consider biofeedback. With this mind-body technique, you learn to control certain bodily functions -- such as muscle tension, heart rate and blood pressure -- to prevent headaches or reduce headache pain.    Proceed to emergency room if you experience new or worsening symptoms or symptoms do not resolve, if you have new neurologic symptoms or if headache is severe, or for any concerning symptom.   Cc: Dr. Irine Seal, MD  [facility] [address] [place], [place] [address]  Phone [phone] Fax [phone] I spent over 20 minutes of face-to-face and non-face-to-face time with patient on the  1. Migraine without aura and without status migrainosus, not intractable      diagnosis.  This included previsit chart review, lab review, study review, order entry, electronic health record documentation, patient education on the different diagnostic and therapeutic options, counseling and coordination of care, risks and benefits of management, compliance, or risk factor  reduction

## 2021-10-29 ENCOUNTER — Encounter: Payer: Self-pay | Admitting: Family Medicine

## 2021-10-29 ENCOUNTER — Ambulatory Visit: Payer: Managed Care, Other (non HMO) | Admitting: Family Medicine

## 2021-10-29 DIAGNOSIS — R1310 Dysphagia, unspecified: Secondary | ICD-10-CM | POA: Diagnosis not present

## 2021-10-29 DIAGNOSIS — G43109 Migraine with aura, not intractable, without status migrainosus: Secondary | ICD-10-CM

## 2021-10-29 DIAGNOSIS — F5104 Psychophysiologic insomnia: Secondary | ICD-10-CM

## 2021-10-29 DIAGNOSIS — K219 Gastro-esophageal reflux disease without esophagitis: Secondary | ICD-10-CM

## 2021-10-29 NOTE — Patient Instructions (Signed)
I would encourage you to follow-up with GI to have esophageal manometry completed for your swallowing issues.  If you change your mind and want to see them please let us know.

## 2021-10-29 NOTE — Assessment & Plan Note (Signed)
Chronic issue.  Stable.  She will continue omeprazole 20 mg daily.

## 2021-10-29 NOTE — Assessment & Plan Note (Signed)
Continue management by neurology.

## 2021-10-29 NOTE — Assessment & Plan Note (Signed)
Chronic issue.  Stable.  She can continue Ambien CR 6.25 mg nightly as needed for sleep.

## 2021-10-29 NOTE — Assessment & Plan Note (Addendum)
Encouraged follow-up with GI to consider esophageal manometry given persistent dysphagia issues.  She declines at this time given cost concerns.  She will let me know if she changes her mind.

## 2021-10-29 NOTE — Progress Notes (Signed)
Tommi Rumps, MD Phone: [phone]  Margaret Silva is a 41 y.o. female who presents today for f/u.  GERD:   Reflux symptoms: no   Abd pain: no   Blood in stool: no  Dysphagia: yes, stable since 2020, had swallow study and EGD with no cause found, GI wanted to refer for manometry   EGD: 2020  Medication: omeprazole  Migraines: Patient has had more frequent migraines recently.  She had 1 most days.  She has seen neurology for follow-up and they started her on Qulipta and gave her several her medications for abortive treatment.  Insomnia: Continues Ambien nightly.  She gets 8.5 hours of sleep at night.  No drowsiness the next day.   Social History   Tobacco Use  Smoking Status Never  . Passive exposure: Past  Smokeless Tobacco Never    Current Outpatient Medications on File Prior to Visit  Medication Sig Dispense Refill  . albuterol (VENTOLIN HFA) 108 (90 Base) MCG/ACT inhaler Inhale 2 puffs into the lungs every 4 (four) hours as needed for wheezing or shortness of breath. 18 each 0  . amitriptyline (ELAVIL) 50 MG tablet TAKE ONE TABLET (50 MG) BY MOUTH AT BEDTIME 90 tablet 4  . Atogepant (QULIPTA) 60 MG TABS Take 60 mg by mouth daily. 30 tablet 11  . Azelastine-Fluticasone 137-50 MCG/ACT SUSP Place 1 spray into both nostrils 2 (two) times daily.    . Celecoxib (ELYXYB) 120 MG/4.8ML SOLN Take 120 mg by mouth daily as needed. 28.8 mL 11  . Cholecalciferol (VITAMIN D PO) Take 5,000 Int'l Units by mouth once a week.    . Clindamycin-Benzoyl Per, Refr, (DUAC) gel Apply to affected areas once daily 45 g 2  . cyclobenzaprine (FLEXERIL) 10 MG tablet Take 1 tablet (10 mg total) by mouth at bedtime. 90 tablet 3  . diclofenac sodium (VOLTAREN) 1 % GEL Apply 4 g topically 3 (three) times daily as needed.    . divalproex (DEPAKOTE) 500 MG DR tablet TAKE ONE TABLET (500 MG) BY MOUTH EVERY DAY 90 tablet 4  . DULoxetine (CYMBALTA) 30 MG capsule TAKE THREE CAPSULES PO DAILY 270  capsule 1  . EPINEPHrine 0.3 mg/0.3 mL IJ SOAJ injection Inject into the muscle.    . fluticasone (FLONASE) 50 MCG/ACT nasal spray Place 2 sprays into both nostrils daily. 16 g 6  . gentamicin cream (GARAMYCIN) 0.1 % Apply 1 application topically as needed (for nose).    Marland Kitchen ketoconazole (NIZORAL) 2 % cream Apply to crease of nose twice daily as needed for flake and itch. 60 g 3  . loratadine (CLARITIN) 10 MG tablet Take 1 tablet (10 mg total) by mouth daily. 90 tablet 3  . methylPREDNISolone Acetate (DEPO-MEDROL IJ) Inject as directed every 3 (three) months. First injection June 2023    . omeprazole (PRILOSEC) 20 MG capsule Take 1 capsule (20 mg total) by mouth daily. 90 capsule 3  . ondansetron (ZOFRAN) 4 MG tablet Take 1 tablet (4 mg total) by mouth every 6 (six) hours. 30 tablet 0  . ondansetron (ZOFRAN-ODT) 4 MG disintegrating tablet Take 1 tablet (4 mg total) by mouth every 8 (eight) hours as needed for nausea. 30 tablet 12  . SUMAtriptan (IMITREX) 100 MG tablet Take 1 tablet (100 mg total) by mouth once as needed for up to 1 dose. May repeat in 2 hours if headache persists or recurs. 10 tablet 12  . Ubrogepant (UBRELVY) 100 MG TABS Take 100 mg by mouth every 2 (two)  hours as needed. Maximum '200mg'$  a day. 16 tablet 11  . valACYclovir (VALTREX) 500 MG tablet TAKE 1 TABLET BY MOUTH EVERY DAY 30 tablet 3  . zolpidem (AMBIEN CR) 6.25 MG CR tablet TAKE 1 TABLET BY MOUTH AT BEDTIME AS NEEDED FOR SLEEP 30 tablet 1   No current facility-administered medications on file prior to visit.     ROS see history of present illness  Objective  Physical Exam Vitals:   10/29/21 1346  BP: 130/70  Pulse: 84  Temp: 98.2 F (36.8 C)  SpO2: 98%    BP Readings from Last 3 Encounters:  10/29/21 130/70  10/23/21 136/89  07/23/21 110/70   Wt Readings from Last 3 Encounters:  10/29/21 201 lb 3.2 oz (91.3 kg)  10/23/21 205 lb (93 kg)  07/23/21 183 lb (83 kg)    Physical Exam Constitutional:       General: She is not in acute distress.    Appearance: She is not diaphoretic.  Cardiovascular:     Rate and Rhythm: Normal rate and regular rhythm.     Heart sounds: Normal heart sounds.  Pulmonary:     Effort: Pulmonary effort is normal.     Breath sounds: Normal breath sounds.  Skin:    General: Skin is warm and dry.  Neurological:     Mental Status: She is alert.     Assessment/Plan: Please see individual problem list.  Problem List Items Addressed This Visit     Chronic insomnia (Chronic)    Chronic issue.  Stable.  She can continue Ambien CR 6.25 mg nightly as needed for sleep.      Dysphagia (Chronic)    Encouraged follow-up with GI to consider esophageal manometry given persistent dysphagia issues.  She declines at this time given cost concerns.  She will let me know if she changes her mind.      GERD (gastroesophageal reflux disease) (Chronic)    Chronic issue.  Stable.  She will continue omeprazole 20 mg daily.      Migraine with aura and without status migrainosus, not intractable    Continue management by neurology.       Return in about 3 months (around 01/28/2022) for Insomnia.   Tommi Rumps, MD [place]

## 2021-11-10 ENCOUNTER — Telehealth: Payer: Self-pay | Admitting: *Deleted

## 2021-11-10 NOTE — Telephone Encounter (Signed)
Completed Roselyn Meier PA on Cover My Meds. Key: [id number]. Awaiting determination from [facility].

## 2021-11-11 ENCOUNTER — Telehealth: Payer: Self-pay | Admitting: *Deleted

## 2021-11-11 NOTE — Telephone Encounter (Signed)
Completed Elyxyb PA on Cover My Meds. Key: [id number] - PA Case ID: [id number] - Rx #: [id number].   Awaiting determination from [facility].

## 2021-11-12 NOTE — Telephone Encounter (Signed)
Approved on September 26 Request Reference Number: [id number]. ELYXYB SOL 120/4.8 is approved through 11/12/2022. Your patient may now fill this prescription and it will be covered.  Pharmacy updated via fax. Received a receipt of confirmation. Patient has been updated.
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
## 2021-11-26 ENCOUNTER — Ambulatory Visit: Payer: Managed Care, Other (non HMO) | Attending: Rheumatology | Admitting: Rheumatology

## 2021-11-26 ENCOUNTER — Encounter: Payer: Self-pay | Admitting: Rheumatology

## 2021-11-26 VITALS — BP 127/80 | HR 101 | Resp 17 | Ht 64.0 in | Wt 210.6 lb

## 2021-11-26 DIAGNOSIS — R5383 Other fatigue: Secondary | ICD-10-CM | POA: Diagnosis not present

## 2021-11-26 DIAGNOSIS — F5104 Psychophysiologic insomnia: Secondary | ICD-10-CM

## 2021-11-26 DIAGNOSIS — K588 Other irritable bowel syndrome: Secondary | ICD-10-CM

## 2021-11-26 DIAGNOSIS — Z8659 Personal history of other mental and behavioral disorders: Secondary | ICD-10-CM

## 2021-11-26 DIAGNOSIS — M62838 Other muscle spasm: Secondary | ICD-10-CM | POA: Diagnosis not present

## 2021-11-26 DIAGNOSIS — Z8669 Personal history of other diseases of the nervous system and sense organs: Secondary | ICD-10-CM

## 2021-11-26 DIAGNOSIS — G2581 Restless legs syndrome: Secondary | ICD-10-CM

## 2021-11-26 DIAGNOSIS — G90A Postural orthostatic tachycardia syndrome (POTS): Secondary | ICD-10-CM

## 2021-11-26 DIAGNOSIS — Z87440 Personal history of urinary (tract) infections: Secondary | ICD-10-CM

## 2021-11-26 DIAGNOSIS — M7062 Trochanteric bursitis, left hip: Secondary | ICD-10-CM | POA: Diagnosis not present

## 2021-11-26 DIAGNOSIS — M797 Fibromyalgia: Secondary | ICD-10-CM

## 2021-11-26 DIAGNOSIS — M170 Bilateral primary osteoarthritis of knee: Secondary | ICD-10-CM

## 2021-11-26 MED ORDER — LIDOCAINE HCL 1 % IJ SOLN
0.5000 mL | INTRAMUSCULAR | Status: AC | PRN
  Administered 2021-11-26: .5 mL

## 2021-11-26 MED ORDER — TRIAMCINOLONE ACETONIDE 40 MG/ML IJ SUSP
10.0000 mg | INTRAMUSCULAR | Status: AC | PRN
  Administered 2021-11-26: 10 mg via INTRAMUSCULAR

## 2021-11-26 MED ORDER — LIDOCAINE HCL 1 % IJ SOLN
1.5000 mL | INTRAMUSCULAR | Status: AC | PRN
  Administered 2021-11-26: 1.5 mL

## 2021-11-26 MED ORDER — TRIAMCINOLONE ACETONIDE 40 MG/ML IJ SUSP
40.0000 mg | INTRAMUSCULAR | Status: AC | PRN
  Administered 2021-11-26: 40 mg via INTRA_ARTICULAR

## 2021-11-28 ENCOUNTER — Encounter: Payer: Self-pay | Admitting: Family Medicine

## 2021-11-28 DIAGNOSIS — R1310 Dysphagia, unspecified: Secondary | ICD-10-CM

## 2021-12-01 ENCOUNTER — Other Ambulatory Visit: Payer: Self-pay | Admitting: Family Medicine

## 2021-12-01 DIAGNOSIS — F419 Anxiety disorder, unspecified: Secondary | ICD-10-CM

## 2021-12-08 ENCOUNTER — Other Ambulatory Visit: Payer: Self-pay | Admitting: Family Medicine

## 2021-12-08 DIAGNOSIS — F5104 Psychophysiologic insomnia: Secondary | ICD-10-CM

## 2021-12-11 ENCOUNTER — Telehealth: Payer: Self-pay | Admitting: *Deleted

## 2021-12-11 NOTE — Telephone Encounter (Signed)
Carolyne Fiscal Key: [id number] - PA Case ID: [id number]   PA Qulipta complete waiting on approval

## 2021-12-11 NOTE — Telephone Encounter (Signed)
Approvedtoday Request Reference Number: [id number]. QULIPTA TAB '60MG'$  is approved through 06/12/2022. Your patient may now fill this prescription and it will be complete     Will contact patient through mychart to make aware of approval

## 2021-12-15 ENCOUNTER — Ambulatory Visit: Payer: Managed Care, Other (non HMO)

## 2021-12-15 ENCOUNTER — Other Ambulatory Visit: Payer: Self-pay | Admitting: Adult Health

## 2021-12-15 VITALS — BP 131/85 | Wt 212.0 lb

## 2021-12-15 DIAGNOSIS — Z3042 Encounter for surveillance of injectable contraceptive: Secondary | ICD-10-CM | POA: Diagnosis not present

## 2021-12-15 DIAGNOSIS — Z3202 Encounter for pregnancy test, result negative: Secondary | ICD-10-CM

## 2021-12-15 LAB — POCT URINE PREGNANCY: Preg Test, Ur: NEGATIVE

## 2021-12-15 MED ORDER — MEDROXYPROGESTERONE ACETATE 150 MG/ML IM SUSP: 150.0000 mg | INTRAMUSCULAR | 4 refills | Status: DC

## 2021-12-15 MED ORDER — MEDROXYPROGESTERONE ACETATE 150 MG/ML IM SUSP
150.0000 mg | INTRAMUSCULAR | Status: AC
  Administered 2021-12-15 – 2024-03-08 (×2): 150 mg via INTRAMUSCULAR

## 2021-12-15 NOTE — Progress Notes (Signed)
Date last pap: 03/14/2018. Last Depo-Provera: 06/25/2021. Side Effects if any: none . Serum HCG indicated? N/A. Depo-Provera 150 mg IM given by: Toya L CMA. Next appointment due 03/02/22-03/16/22.

## 2022-01-06 ENCOUNTER — Other Ambulatory Visit: Payer: Self-pay | Admitting: Neurology

## 2022-01-20 ENCOUNTER — Other Ambulatory Visit: Payer: Self-pay

## 2022-01-20 ENCOUNTER — Encounter: Payer: Self-pay | Admitting: Gastroenterology

## 2022-01-20 ENCOUNTER — Ambulatory Visit: Payer: Managed Care, Other (non HMO) | Admitting: Gastroenterology

## 2022-01-20 VITALS — BP 142/88 | HR 101 | Temp 98.3°F | Wt 220.0 lb

## 2022-01-20 DIAGNOSIS — R1084 Generalized abdominal pain: Secondary | ICD-10-CM

## 2022-01-20 DIAGNOSIS — R1319 Other dysphagia: Secondary | ICD-10-CM | POA: Diagnosis not present

## 2022-01-20 NOTE — Progress Notes (Signed)
Jonathon Bellows MD, MRCP(U.K) [address]  [place]  [place],  [address]  Main: [phone]  Fax: [phone]   Gastroenterology Consultation  Referring Provider:     Leone Haven, MD Primary Care Physician:  Leone Haven, MD Primary Gastroenterologist:  Dr. Jonathon Bellows  Reason for Consultation:     Dysphagia         HPI:   Margaret Silva is a 41 y.o. y/o female referred for consultation & management  by Dr. Caryl Bis, Angela Adam, MD.     She has been referred to see me for dysphagia.  Last seen in the office over 3 years back with Dr. 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   Plan 3 years back for EGD to evaluate dysphagia, rectal bleeding. 09/30/2018: EGD showed no strictures showed mild reactive duodenitis on biopsies reactive gastritis on biopsies fundic gland polyps no evidence of eosinophilic esophagitis on the biopsies of the esophagus. Appears plan was for esophageal manometry but declined due to the costs. She was seen by Dr. Caryl Bis on 10/29/2021 and was seen for reflux.  She states that the past 3 years she has had gradual worsening of dysphagia to solids and liquids.  Associated with heartburn.  Feels like the food gets stuck in the center of her chest.  Takes 20 mg of omeprazole the morning and 10 mg at night after her meals.  Gained weight over the past few months.  No family history of esophageal cancer.  Denies any NSAID use.   Past Medical History:  Diagnosis Date   Asthma    Bursitis of left hip    Diverticulitis    h/o   Dysplastic nevus 09/03/2021   right lateral knee, moderate   Endometriosis    Fibromyalgia    GERD (gastroesophageal reflux disease)    Headache    History of chicken pox    History of colonoscopy 11/04 and April 06   History of laparoscopy 12/04 and 12/07   History of laparoscopy 01/29/2006   Dr. Georga Bora Endometriosis   History of trichomoniasis  09/01/2018   Hx of migraines    Hx: UTI (urinary tract infection)    Interstitial cystitis 01/03/2004   hydrodistilation   Jaundice    h/o   Plantar fasciitis, bilateral    PONV (postoperative nausea and vomiting)    Pott's disease    Not official but working on getting the diagnosis    Past Surgical History:  Procedure Laterality Date   CYSTOSCOPY WITH HYDRODISTENSION AND BIOPSY     DIAGNOSTIC LAPAROSCOPY  02/16/2003   ESOPHAGOGASTRODUODENOSCOPY (EGD) WITH PROPOFOL N/A 09/30/2018   Procedure: ESOPHAGOGASTRODUODENOSCOPY (EGD) WITH PROPOFOL;  Surgeon: Virgel Manifold, MD;  Location: [facility] ENDOSCOPY;  Service: Endoscopy;  Laterality: N/A;   SIGMOIDOSCOPY  05/26/10    Prior to Admission medications   Medication Sig Start Date End Date Taking? Authorizing Provider  albuterol (VENTOLIN HFA) 108 (90 Base) MCG/ACT inhaler Inhale 2 puffs into the lungs every 4 (four) hours as needed for wheezing or shortness of breath. 09/04/20   Rodriguez-Southworth, Sunday Spillers, PA-C  amitriptyline (ELAVIL) 50 MG tablet TAKE ONE TABLET (50 MG) BY MOUTH AT BEDTIME 10/23/21   Melvenia Beam, MD  Atogepant (QULIPTA) 60 MG TABS Take 60 mg by mouth daily. 10/23/21   Melvenia Beam, MD  Azelastine-Fluticasone [phone] MCG/ACT SUSP Place 1 spray into both nostrils 2 (two) times daily. Patient not taking: Reported on 11/26/2021 05/12/21  [provider]  Celecoxib (ELYXYB) 120 MG/4.8ML SOLN Take 120 mg by mouth daily as needed. 10/23/21   Melvenia Beam, MD  Cholecalciferol (VITAMIN D PO) Take 5,000 Int'l Units by mouth once a week.    [provider]  Clindamycin-Benzoyl Per, Refr, (DUAC) gel Apply to affected areas once daily 09/03/21   Moye, [place], MD  cyclobenzaprine (FLEXERIL) 10 MG tablet Take 1 tablet (10 mg total) by mouth at bedtime. 10/23/21   Melvenia Beam, MD  diclofenac (CATAFLAM) 50 MG tablet Take 50 mg by mouth daily. 12/03/21   [provider]  diclofenac sodium (VOLTAREN) 1  % GEL Apply 4 g topically 3 (three) times daily as needed. Patient not taking: Reported on 11/26/2021    [provider]  divalproex (DEPAKOTE) 500 MG DR tablet TAKE ONE TABLET (500 MG) BY MOUTH EVERY DAY 10/23/21   Melvenia Beam, MD  DULoxetine (CYMBALTA) 30 MG capsule TAKE 3 CAPSULES BY MOUTH EVERY DAY 12/01/21   Leone Haven, MD  EPINEPHrine 0.3 mg/0.3 mL IJ SOAJ injection Inject into the muscle. 06/06/20   [provider]  fluticasone (FLONASE) 50 MCG/ACT nasal spray Place 2 sprays into both nostrils daily. Patient not taking: Reported on 11/26/2021 02/21/20   Chevis Pretty, FNP  gentamicin cream (GARAMYCIN) 0.1 % Apply 1 application topically as needed (for nose).    [provider]  ketoconazole (NIZORAL) 2 % cream Apply to crease of nose twice daily as needed for flake and itch. 09/28/19   Moye, [place], MD  loratadine (CLARITIN) 10 MG tablet Take 1 tablet (10 mg total) by mouth daily. 09/12/21   Leone Haven, MD  medroxyPROGESTERone (DEPO-PROVERA) 150 MG/ML injection Inject 1 mL (150 mg total) into the muscle every 3 (three) months. 12/15/21   Caren Macadam, MD  methylPREDNISolone Acetate (DEPO-MEDROL IJ) Inject as directed every 3 (three) months. First injection June 2023    [provider]  omeprazole (PRILOSEC) 20 MG capsule Take 1 capsule (20 mg total) by mouth daily. 09/12/21   Leone Haven, MD  ondansetron (ZOFRAN) 4 MG tablet Take 1 tablet (4 mg total) by mouth every 6 (six) hours. 07/17/21   Scot Jun, FNP  ondansetron (ZOFRAN-ODT) 4 MG disintegrating tablet Take 1 tablet (4 mg total) by mouth every 8 (eight) hours as needed for nausea. 11/30/20   Melvenia Beam, MD  SUMAtriptan (IMITREX) 100 MG tablet Take 1 tablet (100 mg total) by mouth once as needed for up to 1 dose. May repeat in 2 hours if headache persists or recurs. 10/23/21   Melvenia Beam, MD  Ubrogepant (UBRELVY) 100 MG TABS Take 100 mg by mouth  every 2 (two) hours as needed. Maximum '200mg'$  a day. 10/23/21   Melvenia Beam, MD  valACYclovir (VALTREX) 500 MG tablet TAKE 1 TABLET BY MOUTH EVERY DAY 09/28/21   Dutch Quint B, FNP  zolpidem (AMBIEN CR) 6.25 MG CR tablet TAKE 1 TABLET BY MOUTH AT BEDTIME AS NEEDED FOR SLEEP 12/09/21   Leone Haven, MD    Family History  Problem Relation Age of Onset   Fibromyalgia Mother    Migraines Mother    Cancer Mother 54       breast   Breast cancer Mother    Hypertension Father    Hyperlipidemia Father    Prostate cancer Father    Ehlers-Danlos syndrome Sister    Fibromyalgia Sister    Thyroid cancer Sister  Diabetes Maternal Grandmother    Arthritis Maternal Grandmother    Kidney disease Maternal Grandmother    Heart attack Maternal Grandfather    Heart disease Maternal Grandfather    Hypertension Maternal Grandfather    Mental illness Maternal Grandfather    Cancer Paternal Grandmother        Liver cancer/lung cancer   Diabetes Paternal Grandmother    Heart attack Paternal Grandfather    Cancer Paternal Grandfather        Pancreatic/ Bladder Cancer/prostate   Arthritis Paternal Grandfather    Heart disease Paternal Grandfather    Ovarian cancer Neg Hx    Colon cancer Neg Hx      Social History   Tobacco Use   Smoking status: Never    Passive exposure: Past   Smokeless tobacco: Never  Vaping Use   Vaping Use: Never used  Substance Use Topics   Alcohol use: Yes    Alcohol/week: 0.0 standard drinks of alcohol    Comment: occasionally   Drug use: No    Allergies as of 01/20/2022 - Review Complete 01/20/2022  Allergen Reaction Noted   Coffee bean extract  [coffea arabica] Nausea Only 09/04/2014   Other  08/12/2015   Trazodone and nefazodone Other (See Comments) 08/03/2013    Review of Systems:    All systems reviewed and negative except where noted in HPI.   Physical Exam:  BP (!) 142/88   Pulse (!) 101   Temp 98.3 F (36.8 C) (Oral)   Wt 220 lb (99.8  kg)   BMI 37.76 kg/m  No LMP recorded. Patient has had an injection. Psych:  Alert and cooperative. Normal mood and affect. General:   Alert,  Well-developed, well-nourished, pleasant and cooperative in NAD Head:  Normocephalic and atraumatic. Eyes:  Sclera clear, no icterus.   Conjunctiva pink. Neurologic:  Alert and oriented x3;  grossly normal neurologically. Psych:  Alert and cooperative. Normal mood and affect.  Imaging Studies: No results found.  Assessment and Plan:   Margaret Silva is a 41 y.o. y/o female has been referred for dysphagia ongoing for 3 years prior endoscopy by Dr. Bonna Gains showed no evidence of obstruction or eosinophilic esophagitis.  History of heartburn suggesting of GERD.  She is taking a PPI but takes it after meals hence is not very effective.    Plan 1.  Commence on omeprazole 40 mg twice daily taken 30 minutes before breakfast and dinner 2.  GERD patient information and picture of a wedge pillow provided lifestyle changes discussed 3.  At next visit in 4 weeks if no better we will get modified barium swallow on barium swallow with tablet if still not better will need esophageal manometry   Follow up in 4 weeks video visit or office visit  Dr Jonathon Bellows MD,MRCP(U.K) \

## 2022-01-20 NOTE — Patient Instructions (Addendum)
Please purchase a wedge pillow at [facility] to help you with your acid reflux.  Food Choices for Gastroesophageal Reflux Disease, Adult When you have gastroesophageal reflux disease (GERD), the foods you eat and your eating habits are very important. Choosing the right foods can help ease your discomfort. Think about working with a food expert (dietitian) to help you make good choices. What are tips for following this plan? Reading food labels Look for foods that are low in saturated fat. Foods that may help with your symptoms include: Foods that have less than 5% of daily value (DV) of fat. Foods that have 0 grams of trans fat. Cooking Do not fry your food. Cook your food by baking, steaming, grilling, or broiling. These are all methods that do not need a lot of fat for cooking. To add flavor, try to use herbs that are low in spice and acidity. Meal planning  Choose healthy foods that are low in fat, such as: Fruits and vegetables. Whole grains. Low-fat dairy products. Lean meats, fish, and poultry. Eat small meals often instead of eating 3 large meals each day. Eat your meals slowly in a place where you are relaxed. Avoid bending over or lying down until 2-3 hours after eating. Limit high-fat foods such as fatty meats or fried foods. Limit your intake of fatty foods, such as oils, butter, and shortening. Avoid the following as told by your doctor: Foods that cause symptoms. These may be different for different people. Keep a food diary to keep track of foods that cause symptoms. Alcohol. Drinking a lot of liquid with meals. Eating meals during the 2-3 hours before bed. Lifestyle Stay at a healthy weight. Ask your doctor what weight is healthy for you. If you need to lose weight, work with your doctor to do so safely. Exercise for at least 30 minutes on 5 or more days each week, or as told by your doctor. Wear loose-fitting clothes. Do not smoke or use any products that contain  nicotine or tobacco. If you need help quitting, ask your doctor. Sleep with the head of your bed higher than your feet. Use a wedge under the mattress or blocks under the bed frame to raise the head of the bed. Chew sugar-free gum after meals. What foods should eat?  Eat a healthy, well-balanced diet of fruits, vegetables, whole grains, low-fat dairy products, lean meats, fish, and poultry. Each person is different. Foods that may cause symptoms in one person may not cause any symptoms in another person. Work with your doctor to find foods that are safe for you. The items listed above may not be a complete list of what you can eat and drink. Contact a food expert for more options. What foods should I avoid? Limiting some of these foods may help in managing the symptoms of GERD. Everyone is different. Talk with a food expert or your doctor to help you find the exact foods to avoid, if any. Fruits Any fruits prepared with added fat. Any fruits that cause symptoms. For some people, this may include citrus fruits, such as oranges, grapefruit, pineapple, and lemons. Vegetables Deep-fried vegetables. [place] fries. Any vegetables prepared with added fat. Any vegetables that cause symptoms. For some people, this may include tomatoes and tomato products, chili peppers, onions and garlic, and horseradish. Grains Pastries or quick breads with added fat. Meats and other proteins High-fat meats, such as fatty beef or pork, hot dogs, ribs, ham, sausage, salami, and bacon. Fried meat or protein,  including fried fish and fried chicken. Nuts and nut butters, in large amounts. Dairy Whole milk and chocolate milk. Sour cream. Cream. Ice cream. Cream cheese. Milkshakes. Fats and oils Butter. Margarine. Shortening. Ghee. Beverages Coffee and tea, with or without caffeine. Carbonated beverages. Sodas. Energy drinks. Fruit juice made with acidic fruits, such as orange or grapefruit. Tomato juice. Alcoholic  drinks. Sweets and desserts Chocolate and cocoa. Donuts. Seasonings and condiments Pepper. Peppermint and spearmint. Added salt. Any condiments, herbs, or seasonings that cause symptoms. For some people, this may include curry, hot sauce, or vinegar-based salad dressings. The items listed above may not be a complete list of what you should not eat and drink. Contact a food expert for more options. Questions to ask your doctor Diet and lifestyle changes are often the first steps that are taken to manage symptoms of GERD. If diet and lifestyle changes do not help, talk with your doctor about taking medicines. Where to find more information International Foundation for Gastrointestinal Disorders: aboutgerd.org Summary When you have GERD, food and lifestyle choices are very important in easing your symptoms. Eat small meals often instead of 3 large meals a day. Eat your meals slowly and in a place where you are relaxed. Avoid bending over or lying down until 2-3 hours after eating. Limit high-fat foods such as fatty meats or fried foods. This information is not intended to replace advice given to you by your health care provider. Make sure you discuss any questions you have with your health care provider. Document Revised: 08/14/2019 Document Reviewed: 08/14/2019 Elsevier Patient Education  [employer].

## 2022-01-20 NOTE — Addendum Note (Signed)
Addended by: Wayna Chalet on: 01/20/2022 03:05 PM   Modules accepted: Orders

## 2022-01-22 LAB — H. PYLORI BREATH TEST: H pylori Breath Test: NEGATIVE

## 2022-01-26 ENCOUNTER — Encounter: Payer: Self-pay | Admitting: Dermatology

## 2022-01-26 ENCOUNTER — Other Ambulatory Visit: Payer: Self-pay

## 2022-01-26 DIAGNOSIS — L219 Seborrheic dermatitis, unspecified: Secondary | ICD-10-CM

## 2022-01-26 MED ORDER — KETOCONAZOLE 2 % EX CREA: TOPICAL_CREAM | CUTANEOUS | 1 refills | Status: DC

## 2022-01-26 NOTE — Progress Notes (Signed)
Ketoconazole RF request per MyChart. aw

## 2022-01-28 ENCOUNTER — Ambulatory Visit: Payer: Managed Care, Other (non HMO) | Admitting: Family Medicine

## 2022-02-03 ENCOUNTER — Encounter
Admission: RE | Disposition: A | Discharge status: Home / Self Care | Payer: Self-pay | Source: Home / Self Care | Attending: Gastroenterology

## 2022-02-03 ENCOUNTER — Ambulatory Visit: Payer: Managed Care, Other (non HMO) | Admitting: Certified Registered"

## 2022-02-03 ENCOUNTER — Other Ambulatory Visit: Payer: Self-pay

## 2022-02-03 ENCOUNTER — Encounter: Payer: Self-pay | Admitting: Gastroenterology

## 2022-02-03 ENCOUNTER — Ambulatory Visit
Admission: RE | Admit: 2022-02-03 | Discharge: 2022-02-03 | Disposition: A | Discharge status: Home / Self Care | Payer: Managed Care, Other (non HMO) | Attending: Gastroenterology | Admitting: Gastroenterology

## 2022-02-03 DIAGNOSIS — R1319 Other dysphagia: Secondary | ICD-10-CM

## 2022-02-03 DIAGNOSIS — K259 Gastric ulcer, unspecified as acute or chronic, without hemorrhage or perforation: Secondary | ICD-10-CM | POA: Diagnosis not present

## 2022-02-03 DIAGNOSIS — R1084 Generalized abdominal pain: Secondary | ICD-10-CM

## 2022-02-03 DIAGNOSIS — K219 Gastro-esophageal reflux disease without esophagitis: Secondary | ICD-10-CM | POA: Insufficient documentation

## 2022-02-03 DIAGNOSIS — R1310 Dysphagia, unspecified: Secondary | ICD-10-CM | POA: Diagnosis not present

## 2022-02-03 DIAGNOSIS — K319 Disease of stomach and duodenum, unspecified: Secondary | ICD-10-CM | POA: Diagnosis not present

## 2022-02-03 DIAGNOSIS — J45909 Unspecified asthma, uncomplicated: Secondary | ICD-10-CM | POA: Insufficient documentation

## 2022-02-03 HISTORY — PX: ESOPHAGOGASTRODUODENOSCOPY (EGD) WITH PROPOFOL: SHX5813

## 2022-02-03 SURGERY — ESOPHAGOGASTRODUODENOSCOPY (EGD) WITH PROPOFOL
Anesthesia: General

## 2022-02-03 MED ORDER — MIDAZOLAM HCL 2 MG/2ML IJ SOLN
INTRAMUSCULAR | Status: AC
  Filled 2022-02-03: qty 2

## 2022-02-03 MED ORDER — MIDAZOLAM HCL 2 MG/2ML IJ SOLN: INTRAMUSCULAR | Status: DC | PRN

## 2022-02-03 MED ORDER — LIDOCAINE HCL (CARDIAC) PF 100 MG/5ML IV SOSY
PREFILLED_SYRINGE | INTRAVENOUS | Status: DC | PRN
  Administered 2022-02-03: 100 mg via INTRAVENOUS

## 2022-02-03 MED ORDER — PROPOFOL 10 MG/ML IV BOLUS
INTRAVENOUS | Status: DC | PRN
  Administered 2022-02-03: 70 mg via INTRAVENOUS
  Administered 2022-02-03: 30 mg via INTRAVENOUS

## 2022-02-03 MED ORDER — GLYCOPYRROLATE 0.2 MG/ML IJ SOLN
INTRAMUSCULAR | Status: DC | PRN
  Administered 2022-02-03: .2 mg via INTRAVENOUS

## 2022-02-03 MED ORDER — PROPOFOL 500 MG/50ML IV EMUL
INTRAVENOUS | Status: DC | PRN
  Administered 2022-02-03: 165 ug/kg/min via INTRAVENOUS

## 2022-02-03 MED ORDER — SODIUM CHLORIDE 0.9 % IV SOLN: INTRAVENOUS | Status: DC

## 2022-02-03 MED ORDER — DEXMEDETOMIDINE HCL IN NACL 200 MCG/50ML IV SOLN
INTRAVENOUS | Status: DC | PRN
  Administered 2022-02-03: 12 ug via INTRAVENOUS

## 2022-02-03 NOTE — Anesthesia Postprocedure Evaluation (Signed)
Anesthesia Post Note  Patient: Margaret Silva  Procedure(s) Performed: ESOPHAGOGASTRODUODENOSCOPY (EGD) WITH PROPOFOL  Patient location during evaluation: Endoscopy Anesthesia Type: General Level of consciousness: awake and alert Pain management: pain level controlled Vital Signs Assessment: post-procedure vital signs reviewed and stable Respiratory status: spontaneous breathing, nonlabored ventilation and respiratory function stable Cardiovascular status: blood pressure returned to baseline and stable Postop Assessment: no apparent nausea or vomiting Anesthetic complications: no   No notable events documented.   Last Vitals:  Vitals:   02/03/22 0926 02/03/22 0932  BP: 107/74 (!) 126/94  Pulse: 93 94  Resp: 20 17  Temp:    SpO2: 100% 97%    Last Pain:  Vitals:   02/03/22 0932  TempSrc:   PainSc: 0-No pain                 Alphonsus Sias

## 2022-02-03 NOTE — Anesthesia Preprocedure Evaluation (Addendum)
Anesthesia Evaluation  Patient identified by MRN, date of birth, ID band Patient awake    Reviewed: Allergy & Precautions, NPO status , Patient's Chart, lab work & pertinent test results  History of Anesthesia Complications (+) PONV and history of anesthetic complications  Airway Mallampati: II  TM Distance: >3 FB Neck ROM: full    Dental no notable dental hx.    Pulmonary asthma    Pulmonary exam normal        Cardiovascular negative cardio ROS Normal cardiovascular exam     Neuro/Psych  Headaches PSYCHIATRIC DISORDERS Anxiety Depression     Neuromuscular disease    GI/Hepatic Neg liver ROS,GERD  ,,  Endo/Other  negative endocrine ROS    Renal/GU negative Renal ROS  negative genitourinary   Musculoskeletal  (+)  Fibromyalgia -  Abdominal   Peds  Hematology negative hematology ROS (+)   Anesthesia Other Findings Past Medical History: No date: Asthma No date: Bursitis of left hip No date: Diverticulitis     Comment:  h/o 09/03/2021: Dysplastic nevus     Comment:  right lateral knee, moderate No date: Endometriosis No date: Fibromyalgia No date: GERD (gastroesophageal reflux disease) No date: Headache No date: History of chicken pox 11/04 and April 06: History of colonoscopy 12/04 and 12/07: History of laparoscopy 01/29/2006: History of laparoscopy     Comment:  Dr. 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  Past Surgical History: No date: CYSTOSCOPY WITH HYDRODISTENSION AND BIOPSY 02/16/2003: DIAGNOSTIC LAPAROSCOPY 09/30/2018:  ESOPHAGOGASTRODUODENOSCOPY (EGD) WITH PROPOFOL; N/A     Comment:  Procedure: ESOPHAGOGASTRODUODENOSCOPY (EGD) WITH               PROPOFOL;  Surgeon: Virgel Manifold, MD;  Location:               [facility] ENDOSCOPY;  Service: Endoscopy;  Laterality: N/A; 05/26/10: SIGMOIDOSCOPY  BMI    Body Mass Index: 38.76 kg/m      Reproductive/Obstetrics negative OB ROS                             Anesthesia Physical Anesthesia Plan  ASA: 2  Anesthesia Plan: General   Post-op Pain Management:    Induction: Intravenous  PONV Risk Score and Plan: Propofol infusion and TIVA  Airway Management Planned: Natural Airway and Nasal Cannula  Additional Equipment:   Intra-op Plan:   Post-operative Plan:   Informed Consent: I have reviewed the patients History and Physical, chart, labs and discussed the procedure including the risks, benefits and alternatives for the proposed anesthesia with the patient or authorized representative who has indicated his/her understanding and acceptance.     Dental Advisory Given  Plan Discussed with: Anesthesiologist, CRNA and Surgeon  Anesthesia Plan Comments: (Patient consented for risks of anesthesia including but not limited to:  - adverse reactions to medications - risk of airway placement if required - damage to eyes, teeth, lips or other oral mucosa - nerve damage due to positioning  - sore throat or hoarseness - Damage to heart, brain, nerves, lungs, other parts of body or loss of life  Patient voiced understanding.)        Anesthesia Quick Evaluation

## 2022-02-03 NOTE — Transfer of Care (Signed)
Immediate Anesthesia Transfer of Care Note  Patient: Margaret Silva  Procedure(s) Performed: ESOPHAGOGASTRODUODENOSCOPY (EGD) WITH PROPOFOL  Patient Location: Endoscopy Unit  Anesthesia Type:General  Level of Consciousness: drowsy and patient cooperative  Airway & Oxygen Therapy: Patient Spontanous Breathing and Patient connected to face mask oxygen  Post-op Assessment: Report given to RN and Post -op Vital signs reviewed and stable  Post vital signs: Reviewed and stable  Last Vitals:  Vitals Value Taken Time  BP 107/74 02/03/22 0926  Temp 35.8 C 02/03/22 0914  Pulse 92 02/03/22 0930  Resp 17 02/03/22 0930  SpO2 97 % 02/03/22 0930  Vitals shown include unvalidated device data.  Last Pain:  Vitals:   02/03/22 0926  TempSrc:   PainSc: 0-No pain         Complications: No notable events documented.

## 2022-02-03 NOTE — H&P (Signed)
Jonathon Bellows, MD [address], [place], [place], [place], [address] [address], [place], [place], [place], [address] Phone: [phone]  Fax: [phone]  Primary Care Physician:  Leone Haven, MD   Pre-Procedure History & Physical: HPI:  Margaret Silva is a 41 y.o. female is here for an endoscopy    Past Medical History:  Diagnosis Date   Asthma    Bursitis of left hip    Diverticulitis    h/o   Dysplastic nevus 09/03/2021   right lateral knee, moderate   Endometriosis    Fibromyalgia    GERD (gastroesophageal reflux disease)    Headache    History of chicken pox    History of colonoscopy 11/04 and April 06   History of laparoscopy 12/04 and 12/07   History of laparoscopy 01/29/2006   Dr. 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    Past Surgical History:  Procedure Laterality Date   CYSTOSCOPY WITH HYDRODISTENSION AND BIOPSY     DIAGNOSTIC LAPAROSCOPY  02/16/2003   ESOPHAGOGASTRODUODENOSCOPY (EGD) WITH PROPOFOL N/A 09/30/2018   Procedure: ESOPHAGOGASTRODUODENOSCOPY (EGD) WITH PROPOFOL;  Surgeon: Virgel Manifold, MD;  Location: [facility] ENDOSCOPY;  Service: Endoscopy;  Laterality: N/A;   SIGMOIDOSCOPY  05/26/10    Prior to Admission medications   Medication Sig Start Date End Date Taking? Authorizing Provider  amitriptyline (ELAVIL) 50 MG tablet TAKE ONE TABLET (50 MG) BY MOUTH AT BEDTIME 10/23/21  Yes Sarina Ill B, MD  divalproex (DEPAKOTE) 500 MG DR tablet TAKE ONE TABLET (500 MG) BY MOUTH EVERY DAY 10/23/21  Yes Melvenia Beam, MD  DULoxetine (CYMBALTA) 30 MG capsule TAKE 3 CAPSULES BY MOUTH EVERY DAY 12/01/21  Yes Leone Haven, MD   omeprazole (PRILOSEC) 20 MG capsule Take 1 capsule (20 mg total) by mouth daily. 09/12/21  Yes Leone Haven, MD  albuterol (VENTOLIN HFA) 108 (90 Base) MCG/ACT inhaler Inhale 2 puffs into the lungs every 4 (four) hours as needed for wheezing or shortness of breath. Patient not taking: Reported on 01/20/2022 09/04/20   Rodriguez-Southworth, Sunday Spillers, PA-C  Atogepant (QULIPTA) 60 MG TABS Take 60 mg by mouth daily. 10/23/21   Melvenia Beam, MD  Azelastine-Fluticasone [phone] MCG/ACT SUSP Place 1 spray into both nostrils 2 (two) times daily. 05/12/21   [provider]  Celecoxib (ELYXYB) 120 MG/4.8ML SOLN Take 120 mg by mouth daily as needed. 10/23/21   Melvenia Beam, MD  Cholecalciferol (VITAMIN D PO) Take 5,000 Int'l Units by mouth once a week. Patient not taking: Reported on 02/03/2022    [provider]  Clindamycin-Benzoyl Per, Refr, (DUAC) gel Apply to affected areas once daily 09/03/21   Moye, [place], MD  cyclobenzaprine (FLEXERIL) 10 MG tablet Take 1 tablet (10 mg total) by mouth at bedtime. 10/23/21   Melvenia Beam, MD  diclofenac (CATAFLAM) 50 MG tablet Take 50 mg by mouth daily. Patient not taking: Reported on 01/20/2022 12/03/21   [provider]  diclofenac sodium (VOLTAREN) 1 % GEL Apply 4 g topically 3 (three) times daily as needed. Patient not taking: Reported on 01/20/2022    [provider]  EPINEPHrine 0.3 mg/0.3 mL IJ SOAJ injection Inject into the muscle. 06/06/20   [provider]  fluticasone (FLONASE) 50 MCG/ACT nasal spray Place 2 sprays into both nostrils daily. 02/21/20   Hassell Done, Mary-Margaret, FNP  gentamicin cream (GARAMYCIN) 0.1 % Apply 1 application topically as needed (for nose).    [provider]  ketoconazole (NIZORAL) 2 % cream Apply to crease of nose twice daily as needed for flake and itch. 01/26/22   Moye, [place], MD  loratadine (CLARITIN) 10 MG tablet Take 1 tablet (10 mg total) by mouth daily. 09/12/21    Leone Haven, MD  medroxyPROGESTERone (DEPO-PROVERA) 150 MG/ML injection Inject 1 mL (150 mg total) into the muscle every 3 (three) months. 12/15/21   Caren Macadam, MD  methylPREDNISolone Acetate (DEPO-MEDROL IJ) Inject as directed every 3 (three) months. First injection June 2023    [provider]  ondansetron (ZOFRAN) 4 MG tablet Take 1 tablet (4 mg total) by mouth every 6 (six) hours. Patient not taking: Reported on 01/20/2022 07/17/21   Scot Jun, FNP  ondansetron (ZOFRAN-ODT) 4 MG disintegrating tablet Take 1 tablet (4 mg total) by mouth every 8 (eight) hours as needed for nausea. Patient not taking: Reported on 01/20/2022 11/30/20   Melvenia Beam, MD  SUMAtriptan (IMITREX) 100 MG tablet Take 1 tablet (100 mg total) by mouth once as needed for up to 1 dose. May repeat in 2 hours if headache persists or recurs. 10/23/21   Melvenia Beam, MD  Ubrogepant (UBRELVY) 100 MG TABS Take 100 mg by mouth every 2 (two) hours as needed. Maximum '200mg'$  a day. 10/23/21   Melvenia Beam, MD  valACYclovir (VALTREX) 500 MG tablet TAKE 1 TABLET BY MOUTH EVERY DAY Patient not taking: Reported on 01/20/2022 09/28/21   Dutch Quint B, FNP  zolpidem (AMBIEN CR) 6.25 MG CR tablet TAKE 1 TABLET BY MOUTH AT BEDTIME AS NEEDED FOR SLEEP 12/09/21   Leone Haven, MD    Allergies as of 01/20/2022 - Review Complete 01/20/2022  Allergen Reaction Noted   Coffee bean extract  [coffea arabica] Nausea Only 09/04/2014   Other  08/12/2015   Trazodone and nefazodone Other (See Comments) 08/03/2013    Family History  Problem Relation Age of Onset   Fibromyalgia Mother    Migraines Mother    Cancer Mother 67       breast   Breast cancer Mother    Hypertension Father    Hyperlipidemia Father    Prostate cancer Father    Ehlers-Danlos syndrome Sister    Fibromyalgia Sister    Thyroid cancer Sister    Diabetes Maternal Grandmother    Arthritis Maternal Grandmother    Kidney disease  Maternal Grandmother    Heart attack Maternal Grandfather    Heart disease Maternal Grandfather    Hypertension Maternal Grandfather    Mental illness Maternal Grandfather    Cancer Paternal Grandmother        Liver cancer/lung cancer   Diabetes Paternal Grandmother    Heart attack Paternal Grandfather    Cancer Paternal Grandfather        Pancreatic/ Bladder Cancer/prostate   Arthritis Paternal Grandfather    Heart disease Paternal Grandfather    Ovarian cancer Neg Hx    Colon cancer Neg Hx     Social History   Socioeconomic History   Marital status: Divorced    Spouse name: Not on file   Number of children: 0  Years of education: 76    Highest education level: Not on file  Occupational History   Occupation: [employer]   Tobacco Use   Smoking status: Never    Passive exposure: Past   Smokeless tobacco: Never  Vaping Use   Vaping Use: Never used  Substance and Sexual Activity   Alcohol use: Yes    Alcohol/week: 0.0 standard drinks of alcohol    Comment: occasionally   Drug use: No   Sexual activity: Yes    Partners: Male  Other Topics Concern   Not on file  Social History Narrative   Lives with dog   Caffeine use:  coffee on occasion maybe once a week   Right handed   Social Determinants of Health   Financial Resource Strain: Not on file  Food Insecurity: Not on file  Transportation Needs: Not on file  Physical Activity: Not on file  Stress: Not on file  Social Connections: Not on file  Intimate Partner Violence: Not on file    Review of Systems: See HPI, otherwise negative ROS  Physical Exam: BP (!) 125/90   Pulse 96   Temp (!) 96.2 F (35.7 C) (Temporal)   Resp 16   Ht '5\' 4"'$  (1.626 m)   Wt 102.4 kg   SpO2 99%   BMI 38.76 kg/m  General:   Alert,  pleasant and cooperative in NAD Head:  Normocephalic and atraumatic. Neck:  Supple; no masses or thyromegaly. Lungs:  Clear throughout to auscultation, normal respiratory effort.     Heart:  +S1, +S2, Regular rate and rhythm, No edema. Abdomen:  Soft, nontender and nondistended. Normal bowel sounds, without guarding, and without rebound.   Neurologic:  Alert and  oriented x4;  grossly normal neurologically.  Impression/Plan: Margaret Silva is here for an endoscopy  to be performed for  evaluation of dysphagia    Risks, benefits, limitations, and alternatives regarding endoscopy have been reviewed with the patient.  Questions have been answered.  All parties agreeable.   Jonathon Bellows, MD  02/03/2022, 8:45 AM

## 2022-02-03 NOTE — Op Note (Signed)
[facility]astroenterology Patient Name: Margaret Silva Procedure Date: 02/03/2022 8:45 AM MRN: [id number] Account #: [id number] Date of Birth: July 29, 1980 Admit Type: Outpatient Age: 41 Room: [facility] ENDO ROOM 2 Gender: Female Note Status: Finalized Instrument Name: Altamese Cabal Endoscope [id number] Procedure:             Upper GI endoscopy Indications:           Dysphagia Providers:             Jonathon Bellows MD, MD Medicines:             Monitored Anesthesia Care Complications:         No immediate complications. Procedure:             Pre-Anesthesia Assessment:                        - Prior to the procedure, a History and Physical was                         performed, and patient medications, allergies and                         sensitivities were reviewed. The patient's tolerance                         of previous anesthesia was reviewed.                        - The risks and benefits of the procedure and the                         sedation options and risks were discussed with the                         patient. All questions were answered and informed                         consent was obtained.                        - ASA Grade Assessment: II - A patient with mild                         systemic disease.                        After obtaining informed consent, the endoscope was                         passed under direct vision. Throughout the procedure,                         the patient's blood pressure, pulse, and oxygen                         saturations were monitored continuously. The Endoscope                         was introduced through the mouth, and advanced to the  third part of duodenum. The upper GI endoscopy was                         accomplished with ease. The patient tolerated the                         procedure well. Findings:      The examined esophagus was normal. A TTS dilator was passed through the        scope. Dilation with a 15-16.5-18 mm balloon dilator was performed to 18       mm. Biopsies were taken with a cold forceps for histology.      The examined duodenum was normal.      Two non-bleeding superficial gastric ulcers with no stigmata of bleeding       were found in the gastric antrum. The largest lesion was 4 mm in largest       dimension. Biopsies were taken with a cold forceps for histology.      The cardia and gastric fundus were normal on retroflexion. Impression:            - Normal esophagus. Dilated. Biopsied.                        - Normal examined duodenum.                        - Non-bleeding gastric ulcers with no stigmata of                         bleeding. Biopsied. Recommendation:        - Discharge patient to home (with escort).                        - Resume previous diet.                        - Continue present medications.                        - Await pathology results.                        - Return to my office as previously scheduled. Procedure Code(s):     --- Professional ---                        [phone], Esophagogastroduodenoscopy, flexible,                         transoral; with transendoscopic balloon dilation of                         esophagus (less than 30 mm diameter)                        [id number], 59, Esophagogastroduodenoscopy, flexible,                         transoral; with biopsy, single or multiple Diagnosis Code(s):     --- Professional ---  K25.9, Gastric ulcer, unspecified as acute or chronic,                         without hemorrhage or perforation                        R13.10, Dysphagia, unspecified CPT copyright 2022 American Medical Association. All rights reserved. The codes documented in this report are preliminary and upon coder review may  be revised to meet current compliance requirements. Jonathon Bellows, MD Jonathon Bellows MD, MD 02/03/2022 9:11:06 AM This report has been signed  electronically. Number of Addenda: 0 Note Initiated On: 02/03/2022 8:45 AM Estimated Blood Loss:  Estimated blood loss: none.      [facility]

## 2022-02-03 NOTE — Anesthesia Procedure Notes (Signed)
Procedure Name: General with mask airway Date/Time: 02/03/2022 9:01 AM  Performed by: Kelton Pillar, CRNAPre-anesthesia Checklist: Patient identified, Emergency Drugs available, Suction available and Patient being monitored Patient Re-evaluated:Patient Re-evaluated prior to induction Oxygen Delivery Method: Simple face mask Induction Type: IV induction Placement Confirmation: positive ETCO2, CO2 detector and breath sounds checked- equal and bilateral Dental Injury: Teeth and Oropharynx as per pre-operative assessment

## 2022-02-04 ENCOUNTER — Encounter: Payer: Self-pay | Admitting: Gastroenterology

## 2022-02-04 LAB — SURGICAL PATHOLOGY

## 2022-02-05 ENCOUNTER — Encounter: Payer: Self-pay | Admitting: Gastroenterology

## 2022-02-05 ENCOUNTER — Ambulatory Visit: Payer: Managed Care, Other (non HMO) | Admitting: Family Medicine

## 2022-02-09 ENCOUNTER — Other Ambulatory Visit: Payer: Self-pay | Admitting: Family Medicine

## 2022-02-09 DIAGNOSIS — B009 Herpesviral infection, unspecified: Secondary | ICD-10-CM

## 2022-02-18 ENCOUNTER — Other Ambulatory Visit: Payer: Self-pay | Admitting: Family Medicine

## 2022-02-18 DIAGNOSIS — F5104 Psychophysiologic insomnia: Secondary | ICD-10-CM

## 2022-02-24 ENCOUNTER — Telehealth (INDEPENDENT_AMBULATORY_CARE_PROVIDER_SITE_OTHER): Payer: Managed Care, Other (non HMO) | Admitting: Gastroenterology

## 2022-02-24 DIAGNOSIS — R1319 Other dysphagia: Secondary | ICD-10-CM | POA: Diagnosis not present

## 2022-02-24 NOTE — Progress Notes (Signed)
Margaret Silva , MD [address]  [place]  [place], [place] [address]  Main: [phone]  Fax: [phone]   Primary Care Physician: Leone Haven, MD  Virtual Visit via Video Note  I connected with patient on 02/24/22 at  3:30 PM EST by video and verified that I am speaking with the correct person using two identifiers.   I discussed the limitations, risks, security and privacy concerns of performing an evaluation and management service by video  and the availability of in person appointments. I also discussed with the patient that there may be a patient responsible charge related to this service. The patient expressed understanding and agreed to proceed.  Location of Patient: Home Location of Provider: Home Persons involved: Patient and provider only   History of Present Illness: Chief Complaint  Patient presents with   Dysphagia    HPI: RAVEEN Silva is a 42 y.o. female    Summary of history :  Seen in 01/2022 dysphagia. Previously  seen in the office over 3 years back with Dr. 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    3 years back for EGD to evaluate dysphagia, rectal bleeding. 09/30/2018: EGD showed no strictures showed mild reactive duodenitis on biopsies reactive gastritis on biopsies fundic gland polyps no evidence of eosinophilic esophagitis on the biopsies of the esophagus. Appears plan was for esophageal manometry but declined due to the costs. She was seen by Dr. Caryl Bis on 10/29/2021 and was seen for reflux.   She states that the past 3 years she has had gradual worsening of dysphagia to solids and liquids.  Associated with heartburn.  Feels like the food gets stuck in the center of her chest.  Takes 20 mg of omeprazole the morning and 10 mg at night after her meals.  Gained weight over the past few months.  No family history of esophageal cancer.  Denies any NSAID use.    Interval history    01/20/2022-02/24/2022  02/03/2022 : EGD: 2 non bleeding gastric ulcers. Esophagus was normal but empirically was dilated to 18 F.Biopsies showed no abnormalities. Gastric bx show reactive gastropathy .   H pylori breath test negative.   Since EGD no issues with swallowing , does have a chronic cough unrelated to meals, takes prilosec 2 times a day before meals, adhering to life style changes for gerd. Noother GI issues/   Current Outpatient Medications  Medication Sig Dispense Refill   albuterol (VENTOLIN HFA) 108 (90 Base) MCG/ACT inhaler Inhale 2 puffs into the lungs every 4 (four) hours as needed for wheezing or shortness of breath. 18 each 0   amitriptyline (ELAVIL) 50 MG tablet TAKE ONE TABLET (50 MG) BY MOUTH AT BEDTIME 90 tablet 4   Atogepant (QULIPTA) 60 MG TABS Take 60 mg by mouth daily. 30 tablet 11   Azelastine-Fluticasone 137-50 MCG/ACT SUSP Place 1 spray into both nostrils 2 (two) times daily.     Celecoxib (ELYXYB) 120 MG/4.8ML SOLN Take 120 mg by mouth daily as needed. 28.8 mL 11   Cholecalciferol (VITAMIN D PO) Take 5,000 Int'l Units by mouth once a week.     Clindamycin-Benzoyl Per, Refr, (DUAC) gel Apply to affected areas once daily 45 g 2   cyclobenzaprine (FLEXERIL) 10 MG tablet Take 1 tablet (10 mg total) by mouth at bedtime. 90 tablet 3   diclofenac (CATAFLAM) 50 MG tablet Take 50 mg by mouth daily.     diclofenac sodium (VOLTAREN) 1 %  GEL Apply 4 g topically 3 (three) times daily as needed.     divalproex (DEPAKOTE) 500 MG DR tablet TAKE ONE TABLET (500 MG) BY MOUTH EVERY DAY 90 tablet 4   DULoxetine (CYMBALTA) 30 MG capsule TAKE 3 CAPSULES BY MOUTH EVERY DAY 270 capsule 1   EPINEPHrine 0.3 mg/0.3 mL IJ SOAJ injection Inject into the muscle.     fluticasone (FLONASE) 50 MCG/ACT nasal spray Place 2 sprays into both nostrils daily. 16 g 6   gentamicin cream (GARAMYCIN) 0.1 % Apply 1 application topically as needed (for nose).     ketoconazole (NIZORAL) 2 % cream Apply to  crease of nose twice daily as needed for flake and itch. 60 g 1   loratadine (CLARITIN) 10 MG tablet Take 1 tablet (10 mg total) by mouth daily. 90 tablet 3   medroxyPROGESTERone (DEPO-PROVERA) 150 MG/ML injection Inject 1 mL (150 mg total) into the muscle every 3 (three) months. 1 mL 4   methylPREDNISolone Acetate (DEPO-MEDROL IJ) Inject as directed every 3 (three) months. First injection June 2023     omeprazole (PRILOSEC) 20 MG capsule Take 1 capsule (20 mg total) by mouth daily. 90 capsule 3   ondansetron (ZOFRAN) 4 MG tablet Take 1 tablet (4 mg total) by mouth every 6 (six) hours. 30 tablet 0   ondansetron (ZOFRAN-ODT) 4 MG disintegrating tablet Take 1 tablet (4 mg total) by mouth every 8 (eight) hours as needed for nausea. 30 tablet 12   SUMAtriptan (IMITREX) 100 MG tablet Take 1 tablet (100 mg total) by mouth once as needed for up to 1 dose. May repeat in 2 hours if headache persists or recurs. 10 tablet 12   Ubrogepant (UBRELVY) 100 MG TABS Take 100 mg by mouth every 2 (two) hours as needed. Maximum '200mg'$  a day. 16 tablet 11   valACYclovir (VALTREX) 500 MG tablet TAKE 1 TABLET(500 MG) BY MOUTH DAILY 30 tablet 3   zolpidem (AMBIEN CR) 6.25 MG CR tablet TAKE 1 TABLET BY MOUTH AT BEDTIME AS NEEDED FOR SLEEP 30 tablet 1   Current Facility-Administered Medications  Medication Dose Route Frequency Provider Last Rate Last Admin   medroxyPROGESTERone (DEPO-PROVERA) injection 150 mg  150 mg Intramuscular Q90 days Caren Macadam, MD   150 mg at 12/15/21 1621    Allergies as of 02/24/2022 - Review Complete 02/24/2022  Allergen Reaction Noted   Coffee bean extract  [coffea arabica] Nausea Only 09/04/2014   Other  08/12/2015   Trazodone and nefazodone Other (See Comments) 08/03/2013    Review of Systems:    All systems reviewed and negative except where noted in HPI.  General Appearance:    Alert, cooperative, no distress, appears stated age  Head:    Normocephalic, without obvious  abnormality, atraumatic  Eyes:    PERRL, conjunctiva/corneas clear,  Ears:    Grossly normal hearing    Neurologic:  Grossly normal    Observations/Objective:  Labs: CMP     Component Value Date/Time   NA 136 07/17/2021 1625   NA 139 11/27/2019 0945   NA 140 03/21/2012 2106   K 4.3 07/17/2021 1625   K 4.1 03/21/2012 2106   CL 105 07/17/2021 1625   CL 111 (H) 03/21/2012 2106   CO2 23 07/17/2021 1625   CO2 23 03/21/2012 2106   GLUCOSE 106 (H) 07/17/2021 1625   GLUCOSE 117 (H) 03/21/2012 2106   BUN 13 07/17/2021 1625   BUN 15 11/27/2019 0945   BUN 13 03/21/2012 2106  CREATININE 0.86 07/17/2021 1625   CREATININE 0.88 03/21/2012 2106   CALCIUM 9.8 07/17/2021 1625   CALCIUM 8.3 (L) 03/21/2012 2106   PROT 7.8 07/17/2021 1625   PROT 6.0 06/25/2021 1120   PROT 6.7 03/21/2012 2106   ALBUMIN 4.2 07/17/2021 1625   ALBUMIN 3.7 (L) 06/25/2021 1120   ALBUMIN 3.7 03/21/2012 2106   AST 14 (L) 07/17/2021 1625   AST 13 (L) 03/21/2012 2106   ALT 17 07/17/2021 1625   ALT 18 03/21/2012 2106   ALKPHOS 73 07/17/2021 1625   ALKPHOS 89 03/21/2012 2106   BILITOT 0.7 07/17/2021 1625   BILITOT 0.3 06/25/2021 1120   BILITOT 0.3 03/21/2012 2106   GFRNONAA >60 07/17/2021 1625   GFRNONAA >60 03/21/2012 2106   GFRAA 85 11/27/2019 0945   GFRAA >60 03/21/2012 2106   Lab Results  Component Value Date   WBC 11.1 (H) 07/17/2021   HGB 15.1 (H) 07/17/2021   HCT 44.4 07/17/2021   MCV 89.3 07/17/2021   PLT 378 07/17/2021    Imaging Studies: No results found.  Assessment and Plan:   MARVELL STAVOLA is a 42 y.o. y/o female here to follow up for dysphagia ongoing for 3 years prior endoscopy by Dr. Bonna Gains showed no evidence of obstruction or eosinophilic esophagitis.  Recent EGD and dilation . Doing well since    Plan 1.  Continue present dose of prilosec BID, at next visit will plan to reduce dose, in meanwhile she will try to adhere to life style changes for GERD and lose weight  2.  Chronic cough she has a post nasal drainage and she says she will get in touch with her ENT     I discussed the assessment and treatment plan with the patient. The patient was provided an opportunity to ask questions and all were answered. The patient agreed with the plan and demonstrated an understanding of the instructions.   The patient was advised to call back or seek an in-person evaluation if the symptoms worsen or if the condition fails to improve as anticipated.  I provided 15 minutes of face-to-face time during this encounter.  Dr Margaret Bellows MD,MRCP [facility]) Gastroenterology/Hepatology Pager: [phone]   Speech recognition software was used to dictate this note.

## 2022-03-10 ENCOUNTER — Ambulatory Visit (INDEPENDENT_AMBULATORY_CARE_PROVIDER_SITE_OTHER): Payer: Managed Care, Other (non HMO)

## 2022-03-10 VITALS — BP 136/89 | HR 104

## 2022-03-10 DIAGNOSIS — Z3042 Encounter for surveillance of injectable contraceptive: Secondary | ICD-10-CM

## 2022-03-10 MED ORDER — MEDROXYPROGESTERONE ACETATE 150 MG/ML IM SUSP
150.0000 mg | INTRAMUSCULAR | Status: AC
  Administered 2022-03-10: 150 mg via INTRAMUSCULAR

## 2022-03-10 NOTE — Progress Notes (Signed)
Date last pap: 03/14/2018. Last Depo-Provera: 12/15/21. Side Effects if any: None. Serum HCG indicated? N/A. Depo-Provera 150 mg IM given by: Toya L CMA . Next appointment due 05/27/2022-06/10/22.

## 2022-03-11 ENCOUNTER — Ambulatory Visit: Payer: Managed Care, Other (non HMO) | Admitting: Family Medicine

## 2022-03-11 ENCOUNTER — Ambulatory Visit: Payer: Managed Care, Other (non HMO)

## 2022-03-11 ENCOUNTER — Encounter: Payer: Self-pay | Admitting: Family Medicine

## 2022-03-11 VITALS — BP 115/70 | HR 102 | Temp 98.9°F | Ht 64.0 in | Wt 236.2 lb

## 2022-03-11 DIAGNOSIS — M545 Low back pain, unspecified: Secondary | ICD-10-CM

## 2022-03-11 DIAGNOSIS — K219 Gastro-esophageal reflux disease without esophagitis: Secondary | ICD-10-CM | POA: Diagnosis not present

## 2022-03-11 DIAGNOSIS — E669 Obesity, unspecified: Secondary | ICD-10-CM | POA: Diagnosis not present

## 2022-03-11 DIAGNOSIS — F5104 Psychophysiologic insomnia: Secondary | ICD-10-CM | POA: Diagnosis not present

## 2022-03-11 LAB — POCT URINE PREGNANCY: Preg Test, Ur: NEGATIVE

## 2022-03-11 MED ORDER — ZEPBOUND 2.5 MG/0.5ML ~~LOC~~ SOAJ: 2.5000 mg | SUBCUTANEOUS | 0 refills | Status: AC

## 2022-03-11 MED ORDER — ZEPBOUND 5 MG/0.5ML ~~LOC~~ SOAJ: 5.0000 mg | SUBCUTANEOUS | 1 refills | Status: DC

## 2022-03-11 NOTE — Assessment & Plan Note (Signed)
Improving with Ambien 6.'25mg'$  nightly as needed for sleep.

## 2022-03-11 NOTE — Progress Notes (Signed)
Patient seen along with medical student Zada Zhong.  I personally evaluated this patient along with the student, and verified all aspects of the history, physical exam, and medical decision making as documented by the student.  I agree with the student's documentation and have made all necessary edits.  Alegandra Sommers, MD  

## 2022-03-11 NOTE — Assessment & Plan Note (Addendum)
Encouraged healthy diet with increased fruits and vegetables. We discussed starting weight-loss medication again as patient's wegovy previously was not covered by insurance. Discussed trialing Zepbound and explained potential side effects such as thyroid/parathyroid cancer, pancreatitis, galladder issues. Patient is agreeable with trying Zepbound. She will let us know if this medication is too expensive.

## 2022-03-11 NOTE — Assessment & Plan Note (Signed)
Given worsening pain, we will obtain a xray of the lumbar spine. Will obtain urine pregnancy test prior to imaging. Patient can take Tylenol over-the-counter as needed for pain.

## 2022-03-11 NOTE — Patient Instructions (Addendum)
Nice to see you. We will see if your insurance will approve Zepbound.  Will get an x-ray today.

## 2022-03-11 NOTE — Progress Notes (Signed)
Tommi Rumps, MD Phone: [phone]  Margaret Silva is a 42 y.o. female who presents today for f/u.  GERD: patient continues to have symptoms of coughing and choking. She had a barium swallow and EGD done which showed normal findings. She will follow up with ENT next month. Currently she takes omeprazole '40mg'$  twice daily for her reflux symptoms. The omeprazole has relieved the abdominal pain but the cough and choking still lingers. She denies any abdominal pain today but does report chronic blood in her stool. She notes she was previously told by GI that as long as the bleeding doesn't increase she can just continue to watch her symptoms.   Back pain: patient reports having months of back pain. At first patient thought it was her fibromyalgia as she does not remember sustaining injury to her back. The pain exacerbates with activity and relieves with rest. She says that it is difficult for her to do daily tasks such as cleaning or short walks. She had one episode of back pain that traveled down the back of her right leg but this has happened only once. Tylenol, ibuprofen, and diclofenac doesn't alleviate the pain. No incontinence. She noted some right leg weakness with the radiation of pain episode.   Insomnia: patient reports having better sleep on Ambien. She typically sleeps 8-8.5 hours of sleep per night and does not report any excessive daytime drowsiness.   Obesity: patient has been eating hot dogs and steak for protein intake. They are some of the only foods she can eat since losing her sense of smell/taste after having Covid two years ago. She does not have many fruits or vegetables in her diet. She states that it is very difficult to do anything to lose weight right now due to her back pain. Patient was previously on wegovy though has not been able to get this at the pharmacy due to back order issues.   Social History   Tobacco Use  Smoking Status Never   Passive exposure: Past   Smokeless Tobacco Never    Current Outpatient Medications on File Prior to Visit  Medication Sig Dispense Refill   albuterol (VENTOLIN HFA) 108 (90 Base) MCG/ACT inhaler Inhale 2 puffs into the lungs every 4 (four) hours as needed for wheezing or shortness of breath. 18 each 0   amitriptyline (ELAVIL) 50 MG tablet TAKE ONE TABLET (50 MG) BY MOUTH AT BEDTIME 90 tablet 4   Atogepant (QULIPTA) 60 MG TABS Take 60 mg by mouth daily. 30 tablet 11   Azelastine-Fluticasone 137-50 MCG/ACT SUSP Place 1 spray into both nostrils 2 (two) times daily.     Celecoxib (ELYXYB) 120 MG/4.8ML SOLN Take 120 mg by mouth daily as needed. 28.8 mL 11   Cholecalciferol (VITAMIN D PO) Take 5,000 Int'l Units by mouth once a week.     Clindamycin-Benzoyl Per, Refr, (DUAC) gel Apply to affected areas once daily 45 g 2   cyclobenzaprine (FLEXERIL) 10 MG tablet Take 1 tablet (10 mg total) by mouth at bedtime. 90 tablet 3   diclofenac (CATAFLAM) 50 MG tablet Take 50 mg by mouth daily.     diclofenac sodium (VOLTAREN) 1 % GEL Apply 4 g topically 3 (three) times daily as needed.     divalproex (DEPAKOTE) 500 MG DR tablet TAKE ONE TABLET (500 MG) BY MOUTH EVERY DAY 90 tablet 4   DULoxetine (CYMBALTA) 30 MG capsule TAKE 3 CAPSULES BY MOUTH EVERY DAY 270 capsule 1   EPINEPHrine 0.3 mg/0.3 mL IJ  SOAJ injection Inject into the muscle.     fluticasone (FLONASE) 50 MCG/ACT nasal spray Place 2 sprays into both nostrils daily. 16 g 6   gentamicin cream (GARAMYCIN) 0.1 % Apply 1 application topically as needed (for nose).     ketoconazole (NIZORAL) 2 % cream Apply to crease of nose twice daily as needed for flake and itch. 60 g 1   loratadine (CLARITIN) 10 MG tablet Take 1 tablet (10 mg total) by mouth daily. 90 tablet 3   medroxyPROGESTERone (DEPO-PROVERA) 150 MG/ML injection Inject 1 mL (150 mg total) into the muscle every 3 (three) months. 1 mL 4   methylPREDNISolone Acetate (DEPO-MEDROL IJ) Inject as directed every 3 (three)  months. First injection June 2023     omeprazole (PRILOSEC) 40 MG capsule Take 80 mg by mouth daily.     ondansetron (ZOFRAN) 4 MG tablet Take 1 tablet (4 mg total) by mouth every 6 (six) hours. 30 tablet 0   ondansetron (ZOFRAN-ODT) 4 MG disintegrating tablet Take 1 tablet (4 mg total) by mouth every 8 (eight) hours as needed for nausea. 30 tablet 12   SUMAtriptan (IMITREX) 100 MG tablet Take 1 tablet (100 mg total) by mouth once as needed for up to 1 dose. May repeat in 2 hours if headache persists or recurs. 10 tablet 12   Ubrogepant (UBRELVY) 100 MG TABS Take 100 mg by mouth every 2 (two) hours as needed. Maximum '200mg'$  a day. 16 tablet 11   valACYclovir (VALTREX) 500 MG tablet TAKE 1 TABLET(500 MG) BY MOUTH DAILY 30 tablet 3   zolpidem (AMBIEN CR) 6.25 MG CR tablet TAKE 1 TABLET BY MOUTH AT BEDTIME AS NEEDED FOR SLEEP 30 tablet 1   Current Facility-Administered Medications on File Prior to Visit  Medication Dose Route Frequency Provider Last Rate Last Admin   medroxyPROGESTERone (DEPO-PROVERA) injection 150 mg  150 mg Intramuscular Q90 days Caren Macadam, MD   150 mg at 12/15/21 1621   medroxyPROGESTERone (DEPO-PROVERA) injection 150 mg  150 mg Intramuscular Q90 days Anyanwu, Ugonna A, MD   150 mg at 03/10/22 1605     ROS see history of present illness  Objective  Physical Exam Vitals:   03/11/22 1457  BP: 115/70  Pulse: (!) 102  Temp: 98.9 F (37.2 C)  SpO2: 97%    BP Readings from Last 3 Encounters:  03/11/22 115/70  03/10/22 136/89  02/03/22 (!) 126/94   Wt Readings from Last 3 Encounters:  03/11/22 236 lb 3.2 oz (107.1 kg)  02/03/22 225 lb 12.8 oz (102.4 kg)  01/20/22 220 lb (99.8 kg)    Physical Exam Constitutional:      Appearance: Normal appearance.  HENT:     Head: Normocephalic and atraumatic.  Cardiovascular:     Rate and Rhythm: Normal rate and regular rhythm.  Pulmonary:     Effort: Pulmonary effort is normal.     Breath sounds: Normal breath  sounds.  Musculoskeletal:     Comments: Tenderness in lumbar back soft tissues. Strength is 5/5 for quads, hamstrings, dorsiflexion, and plantarflexion in bilateral lower extremities. Sensation to light touch intact throughout lower extremities.   Skin:    General: Skin is warm and dry.  Neurological:     Mental Status: She is alert.      Assessment/Plan: Please see individual problem list.  Problem List Items Addressed This Visit     Chronic insomnia (Chronic)    Improving with Ambien 6.'25mg'$  nightly as needed for sleep.  GERD (gastroesophageal reflux disease) (Chronic)    Chronic issue. Continue omeprazole '40mg'$  twice daily. Patient will follow up with ENT next month.       Relevant Medications   omeprazole (PRILOSEC) 40 MG capsule   Obesity (BMI 30.0-34.9) - Primary (Chronic)    Encouraged healthy diet with increased fruits and vegetables. We discussed starting weight-loss medication again as patient's wegovy previously was not covered by insurance. Discussed trialing Zepbound and explained potential side effects such as thyroid/parathyroid cancer, pancreatitis, galladder issues. Patient is agreeable with trying Zepbound. She will let us know if this medication is too expensive.      Relevant Medications   tirzepatide (ZEPBOUND) 2.5 MG/0.5ML Pen   tirzepatide (ZEPBOUND) 5 MG/0.5ML Pen   Low back pain    Given worsening pain, we will obtain a xray of the lumbar spine. Will obtain urine pregnancy test prior to imaging. Patient can take Tylenol over-the-counter as needed for pain.      Relevant Orders   POCT urine pregnancy (Completed)   DG Lumbar Spine Complete     Return in about 3 months (around 06/10/2022) for Weight.   Marisa Cyphers, Medical Student [place]

## 2022-03-11 NOTE — Assessment & Plan Note (Signed)
Chronic issue. Continue omeprazole '40mg'$  twice daily. Patient will follow up with ENT next month.

## 2022-03-17 ENCOUNTER — Other Ambulatory Visit: Payer: Self-pay

## 2022-03-17 ENCOUNTER — Encounter: Payer: Self-pay | Admitting: Gastroenterology

## 2022-03-17 MED ORDER — OMEPRAZOLE 20 MG PO CPDR: 20.0000 mg | DELAYED_RELEASE_CAPSULE | Freq: Two times a day (BID) | ORAL | 3 refills | Status: DC

## 2022-03-18 ENCOUNTER — Encounter: Payer: Self-pay | Admitting: Family Medicine

## 2022-03-18 DIAGNOSIS — M545 Low back pain, unspecified: Secondary | ICD-10-CM

## 2022-03-26 ENCOUNTER — Other Ambulatory Visit (HOSPITAL_COMMUNITY): Payer: Self-pay

## 2022-03-30 ENCOUNTER — Other Ambulatory Visit (HOSPITAL_COMMUNITY): Payer: Self-pay

## 2022-04-02 ENCOUNTER — Other Ambulatory Visit (HOSPITAL_COMMUNITY): Payer: Self-pay

## 2022-04-06 ENCOUNTER — Encounter: Payer: Self-pay | Admitting: Neurology

## 2022-04-06 ENCOUNTER — Telehealth: Payer: Managed Care, Other (non HMO) | Admitting: Physician Assistant

## 2022-04-06 DIAGNOSIS — N390 Urinary tract infection, site not specified: Secondary | ICD-10-CM | POA: Diagnosis not present

## 2022-04-06 MED ORDER — NITROFURANTOIN MONOHYD MACRO 100 MG PO CAPS: 100.0000 mg | ORAL_CAPSULE | Freq: Two times a day (BID) | ORAL | 0 refills | Status: AC

## 2022-04-06 NOTE — Progress Notes (Signed)
E-Visit for Urinary Problems  We are sorry that you are not feeling well.  Here is how we plan to help!  Based on what you shared with me it looks like you most likely have a simple urinary tract infection.  A UTI (Urinary Tract Infection) is a bacterial infection of the bladder.  Most cases of urinary tract infections are simple to treat but a key part of your care is to encourage you to drink plenty of fluids and watch your symptoms carefully.  I have prescribed MacroBid 100 mg twice a day for 5 days.  Your symptoms should gradually improve. Call us if the burning in your urine worsens, you develop worsening fever, back pain or pelvic pain or if your symptoms do not resolve after completing the antibiotic.  Urinary tract infections can be prevented by drinking plenty of water to keep your body hydrated.  Also be sure when you wipe, wipe from front to back and don't hold it in!  If possible, empty your bladder every 4 hours.  HOME CARE Drink plenty of fluids Compete the full course of the antibiotics even if the symptoms resolve Remember, when you need to go.go. Holding in your urine can increase the likelihood of getting a UTI! GET HELP RIGHT AWAY IF: You cannot urinate You get a high fever Worsening back pain occurs You see blood in your urine You feel sick to your stomach or throw up You feel like you are going to pass out  MAKE SURE YOU  Understand these instructions. Will watch your condition. Will get help right away if you are not doing well or get worse.   Thank you for choosing an e-visit.  Your e-visit answers were reviewed by a board certified advanced clinical practitioner to complete your personal care plan. Depending upon the condition, your plan could have included both over the counter or prescription medications.  Please review your pharmacy choice. Make sure the pharmacy is open so you can pick up prescription now. If there is a problem, you may contact your  provider through [employer] and have the prescription routed to another pharmacy.  Your safety is important to [place]. If you have drug allergies check your prescription carefully.   For the next 24 hours you can use MyChart to ask questions about today's visit, request a non-urgent call back, or ask for a work or school excuse. You will get an email in the next two days asking about your experience. I hope that your e-visit has been valuable and will speed your recovery.   I have spent 5 minutes in review of e-visit questionnaire, review and updating patient chart, medical decision making and response to patient.   Lenise Arena Ward, PA-C

## 2022-04-19 ENCOUNTER — Other Ambulatory Visit: Payer: Self-pay | Admitting: Family Medicine

## 2022-04-19 DIAGNOSIS — F5104 Psychophysiologic insomnia: Secondary | ICD-10-CM

## 2022-04-21 ENCOUNTER — Other Ambulatory Visit: Payer: Self-pay | Admitting: Otolaryngology

## 2022-04-21 DIAGNOSIS — R430 Anosmia: Secondary | ICD-10-CM

## 2022-05-07 ENCOUNTER — Ambulatory Visit
Admission: RE | Admit: 2022-05-07 | Discharge: 2022-05-07 | Disposition: A | Discharge status: Home / Self Care | Payer: Managed Care, Other (non HMO) | Source: Ambulatory Visit | Attending: Otolaryngology | Admitting: Otolaryngology

## 2022-05-07 DIAGNOSIS — R430 Anosmia: Secondary | ICD-10-CM

## 2022-05-07 MED ORDER — GADOPICLENOL 0.5 MMOL/ML IV SOLN
10.0000 mL | Freq: Once | INTRAVENOUS | Status: AC | PRN
  Administered 2022-05-07: 10 mL via INTRAVENOUS

## 2022-05-11 ENCOUNTER — Other Ambulatory Visit (HOSPITAL_COMMUNITY): Payer: Self-pay

## 2022-05-12 ENCOUNTER — Telehealth: Payer: Self-pay

## 2022-05-12 NOTE — Telephone Encounter (Signed)
Approved today Request Reference Number: [id number]. OMEPRAZOLE CAP 20MG  is approved through 05/12/2023. Your patient may now fill this prescription and it will be covered.

## 2022-05-12 NOTE — Telephone Encounter (Signed)
Submitted PA through cover my meds for omeprazole 20mg  twice daily. Waiting on response from insurance company

## 2022-05-13 ENCOUNTER — Other Ambulatory Visit (HOSPITAL_COMMUNITY): Payer: Self-pay

## 2022-05-27 ENCOUNTER — Other Ambulatory Visit (HOSPITAL_COMMUNITY): Payer: Self-pay

## 2022-05-28 ENCOUNTER — Other Ambulatory Visit: Payer: Self-pay | Admitting: Family Medicine

## 2022-05-28 DIAGNOSIS — F5104 Psychophysiologic insomnia: Secondary | ICD-10-CM

## 2022-05-29 ENCOUNTER — Other Ambulatory Visit: Payer: Self-pay | Admitting: Family Medicine

## 2022-05-29 DIAGNOSIS — F419 Anxiety disorder, unspecified: Secondary | ICD-10-CM

## 2022-06-04 ENCOUNTER — Ambulatory Visit: Payer: Managed Care, Other (non HMO) | Admitting: Internal Medicine

## 2022-06-04 ENCOUNTER — Ambulatory Visit
Admission: RE | Admit: 2022-06-04 | Discharge: 2022-06-04 | Disposition: A | Discharge status: Home / Self Care | Payer: Managed Care, Other (non HMO) | Source: Ambulatory Visit | Attending: Internal Medicine | Admitting: Internal Medicine

## 2022-06-04 ENCOUNTER — Ambulatory Visit
Admission: RE | Admit: 2022-06-04 | Discharge: 2022-06-04 | Disposition: A | Discharge status: Home / Self Care | Payer: Managed Care, Other (non HMO) | Attending: Internal Medicine | Admitting: Internal Medicine

## 2022-06-04 ENCOUNTER — Encounter: Payer: Self-pay | Admitting: Internal Medicine

## 2022-06-04 VITALS — BP 128/72 | HR 100 | Temp 97.7°F | Ht 64.0 in | Wt 241.8 lb

## 2022-06-04 DIAGNOSIS — G4719 Other hypersomnia: Secondary | ICD-10-CM

## 2022-06-04 DIAGNOSIS — J4540 Moderate persistent asthma, uncomplicated: Secondary | ICD-10-CM

## 2022-06-04 MED ORDER — BUDESONIDE-FORMOTEROL FUMARATE 160-4.5 MCG/ACT IN AERO: 2.0000 | INHALATION_SPRAY | Freq: Two times a day (BID) | RESPIRATORY_TRACT | 12 refills | Status: DC

## 2022-06-04 NOTE — Progress Notes (Signed)
[facility] Pulmonary Medicine Consultation      Date: 06/04/2022,   MRN# [id number] Margaret Silva May 11, 1980     CHIEF COMPLAINT:   Cough    HISTORY OF PRESENT ILLNESS   42 year old pleasant white female seen today for chronic cough for 4 years Patient stated it started on a mission trip 4 years ago when she was exposed to termites Several years later in 2022 patient was diagnosed with COVID-19 infection Patient is an smoker  Patient has been diagnosed with childhood asthma Triggers include tree pollen exercise  Patient denies any wheezing No exacerbation at this time No evidence of heart failure at this time No evidence or signs of infection at this time No respiratory distress No fevers, chills, nausea, vomiting, diarrhea No evidence of lower extremity edema No evidence hemoptysis  Patient has never been on inhaled steroids in the past  Patient has signs symptoms of excessive daytime sleepiness along with snoring  Epworth sleep score is 3 Will plan to assess for sleep apnea       ENT EVALUATION Patient has been seen by ENT in the past by Dr. Andee Poles He recommends saline sprays 6 times a day per patient Patient does not take any type of intranasal steroids at this time   GI history 02/03/2022 : EGD: 2 non bleeding gastric ulcers. Esophagus was normal but empirically was dilated to 18 F.Biopsies showed no abnormalities. Gastric bx show reactive gastropathy .    H pylori breath test negative.  prilosec 2 times a day before meals, adhering to life style changes for gerd    Current weight 241 Patient has gained 60 pounds over the last 6 months Recommend weight loss    PAST MEDICAL HISTORY   Past Medical History:  Diagnosis Date   Asthma    Bursitis of left hip    Diverticulitis    h/o   Dysplastic nevus 09/03/2021   right lateral knee, moderate   Endometriosis    Fibromyalgia    GERD (gastroesophageal reflux disease)    Headache     History of chicken pox    History of colonoscopy 11/04 and April 06   History of laparoscopy 12/04 and 12/07   History of laparoscopy 01/29/2006   Dr. 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 but working on getting the diagnosis     SURGICAL HISTORY   Past Surgical History:  Procedure Laterality Date   CYSTOSCOPY WITH HYDRODISTENSION AND BIOPSY     DIAGNOSTIC LAPAROSCOPY  02/16/2003   ESOPHAGOGASTRODUODENOSCOPY (EGD) WITH PROPOFOL N/A 09/30/2018   Procedure: ESOPHAGOGASTRODUODENOSCOPY (EGD) WITH PROPOFOL;  Surgeon: Pasty Spillers, MD;  Location: [facility] ENDOSCOPY;  Service: Endoscopy;  Laterality: N/A;   ESOPHAGOGASTRODUODENOSCOPY (EGD) WITH PROPOFOL N/A 02/03/2022   Procedure: ESOPHAGOGASTRODUODENOSCOPY (EGD) WITH PROPOFOL;  Surgeon: Wyline Mood, MD;  Location: [facility] ENDOSCOPY;  Service: Gastroenterology;  Laterality: N/A;   SIGMOIDOSCOPY  05/26/10     FAMILY HISTORY   Family History  Problem Relation Age of Onset   Fibromyalgia Mother    Migraines Mother    Cancer Mother 65       breast   Breast cancer Mother    Hypertension Father  Hyperlipidemia Father    Prostate cancer Father    Ehlers-Danlos syndrome Sister    Fibromyalgia Sister    Thyroid cancer Sister    Diabetes Maternal Grandmother    Arthritis Maternal Grandmother    Kidney disease Maternal Grandmother    Heart attack Maternal Grandfather    Heart disease Maternal Grandfather    Hypertension Maternal Grandfather    Mental illness Maternal Grandfather    Cancer Paternal Grandmother        Liver cancer/lung cancer   Diabetes Paternal Grandmother    Heart attack Paternal Grandfather    Cancer Paternal Grandfather        Pancreatic/  Bladder Cancer/prostate   Arthritis Paternal Grandfather    Heart disease Paternal Grandfather    Ovarian cancer Neg Hx    Colon cancer Neg Hx      SOCIAL HISTORY   Social History   Tobacco Use   Smoking status: Never    Passive exposure: Past   Smokeless tobacco: Never  Vaping Use   Vaping Use: Never used  Substance Use Topics   Alcohol use: Yes    Alcohol/week: 0.0 standard drinks of alcohol    Comment: occasionally   Drug use: No     MEDICATIONS    Home Medication:  Current Outpatient Rx   Order #: [id number] Class: Normal   Order #: [id number] Class: Normal   Order #: [id number] Class: Normal   Order #: [id number] Class: Historical Med   Order #: [id number] Class: Normal   Order #: [id number] Class: Historical Med   Order #: [id number] Class: Normal   Order #: [id number] Class: Normal   Order #: [id number] Class: Historical Med   Order #: [id number] Class: Historical Med   Order #: [id number] Class: Normal   Order #: [id number] Class: Normal   Order #: [id number] Class: Historical Med   Order #: [id number] Class: Normal   Order #: [id number] Class: Historical Med   Order #: [id number] Class: Normal   Order #: [id number] Class: Normal   Order #: [id number] Class: Normal   Order #: [id number] Class: Historical Med   Order #: [id number] Class: Normal   Order #: [id number] Class: Normal   Order #: [id number] Class: Normal   Order #: [id number] Class: Normal   Order #: [id number] Class: Normal   Order #: [id number] Class: Normal   Order #: [id number] Class: Normal   [START ON 06/18/2022] Order #: [id number] Class: Normal    Current Medication:  Current Outpatient Medications:    albuterol (VENTOLIN HFA) 108 (90 Base) MCG/ACT inhaler, Inhale 2 puffs into the lungs every 4 (four) hours as needed for wheezing or shortness of breath., Disp: 18 each, Rfl: 0   amitriptyline (ELAVIL) 50 MG tablet, TAKE ONE TABLET (50 MG) BY MOUTH AT BEDTIME, Disp: 90 tablet, Rfl: 4   Atogepant (QULIPTA) 60 MG TABS, Take 60 mg by  mouth daily., Disp: 30 tablet, Rfl: 11   Azelastine-Fluticasone 137-50 MCG/ACT SUSP, Place 1 spray into both nostrils 2 (two) times daily., Disp: , Rfl:    Celecoxib (ELYXYB) 120 MG/4.8ML SOLN, Take 120 mg by mouth daily as needed., Disp: 28.8 mL, Rfl: 11   Cholecalciferol (VITAMIN D PO), Take 5,000 Int'l Units by mouth once a week., Disp: , Rfl:    Clindamycin-Benzoyl Per, Refr, (DUAC) gel, Apply to affected areas once daily, Disp: 45 g, Rfl: 2   cyclobenzaprine (FLEXERIL) 10 MG tablet, Take 1 tablet (10 mg total) by mouth at bedtime., Disp: 90 tablet, Rfl: 3   diclofenac (CATAFLAM) 50 MG tablet, Take 50 mg by mouth daily., Disp: , Rfl:    diclofenac sodium (VOLTAREN)  1 % GEL, Apply 4 g topically 3 (three) times daily as needed., Disp: , Rfl:    divalproex (DEPAKOTE) 500 MG DR tablet, TAKE ONE TABLET (500 MG) BY MOUTH EVERY DAY, Disp: 90 tablet, Rfl: 4   DULoxetine (CYMBALTA) 30 MG capsule, TAKE 3 CAPSULES BY MOUTH DAILY, Disp: 270 capsule, Rfl: 1   EPINEPHrine 0.3 mg/0.3 mL IJ SOAJ injection, Inject into the muscle., Disp: , Rfl:    fluticasone (FLONASE) 50 MCG/ACT nasal spray, Place 2 sprays into both nostrils daily., Disp: 16 g, Rfl: 6   gentamicin cream (GARAMYCIN) 0.1 %, Apply 1 application topically as needed (for nose)., Disp: , Rfl:    ketoconazole (NIZORAL) 2 % cream, Apply to crease of nose twice daily as needed for flake and itch., Disp: 60 g, Rfl: 1   loratadine (CLARITIN) 10 MG tablet, Take 1 tablet (10 mg total) by mouth daily., Disp: 90 tablet, Rfl: 3   medroxyPROGESTERone (DEPO-PROVERA) 150 MG/ML injection, Inject 1 mL (150 mg total) into the muscle every 3 (three) months., Disp: 1 mL, Rfl: 4   methylPREDNISolone Acetate (DEPO-MEDROL IJ), Inject as directed every 3 (three) months. First injection June 2023, Disp: , Rfl:    omeprazole (PRILOSEC) 20 MG capsule, Take 1 capsule (20 mg total) by mouth 2 (two) times daily before a meal., Disp: 180 capsule, Rfl: 3   ondansetron (ZOFRAN) 4  MG tablet, Take 1 tablet (4 mg total) by mouth every 6 (six) hours., Disp: 30 tablet, Rfl: 0   ondansetron (ZOFRAN-ODT) 4 MG disintegrating tablet, Take 1 tablet (4 mg total) by mouth every 8 (eight) hours as needed for nausea., Disp: 30 tablet, Rfl: 12   SUMAtriptan (IMITREX) 100 MG tablet, Take 1 tablet (100 mg total) by mouth once as needed for up to 1 dose. May repeat in 2 hours if headache persists or recurs., Disp: 10 tablet, Rfl: 12   tirzepatide (ZEPBOUND) 5 MG/0.5ML Pen, Inject 5 mg into the skin once a week. Start after you complete the 28 days of the 2.5 mg dose., Disp: 6 mL, Rfl: 1   Ubrogepant (UBRELVY) 100 MG TABS, Take 100 mg by mouth every 2 (two) hours as needed. Maximum 200mg  a day., Disp: 16 tablet, Rfl: 11   valACYclovir (VALTREX) 500 MG tablet, TAKE 1 TABLET(500 MG) BY MOUTH DAILY, Disp: 30 tablet, Rfl: 3   [START ON 06/18/2022] zolpidem (AMBIEN CR) 6.25 MG CR tablet, TAKE 1 TABLET BY MOUTH AT BEDTIME AS NEEDED FOR SLEEP, Disp: 30 tablet, Rfl: 1  Current Facility-Administered Medications:    medroxyPROGESTERone (DEPO-PROVERA) injection 150 mg, 150 mg, Intramuscular, Q90 days, Federico Flake, MD, 150 mg at 12/15/21 1621   medroxyPROGESTERone (DEPO-PROVERA) injection 150 mg, 150 mg, Intramuscular, Q90 days, Anyanwu, Ugonna A, MD, 150 mg at 03/10/22 1605    ALLERGIES   Coffee bean extract  [coffea arabica], Other, and Trazodone and nefazodone     REVIEW OF SYSTEMS    Review of Systems:  Gen:  Denies  fever, sweats, chills weigh loss  HEENT: Denies blurred vision, double vision, ear pain, eye pain, hearing loss, nose bleeds, sore throat Cardiac:  No dizziness, chest pain or heaviness, chest tightness,edema Resp:   Denies cough or sputum porduction, shortness of breath,wheezing, hemoptysis,  Gi: Denies swallowing difficulty, stomach pain, nausea or vomiting, diarrhea, constipation, bowel incontinence Gu:  Denies bladder incontinence, burning urine Ext:   Denies  Joint pain, stiffness or swelling Skin: Denies  skin rash, easy bruising or bleeding or hives Endoc:  Denies polyuria, polydipsia , polyphagia or weight change Psych:   Denies depression, insomnia or hallucinations   Other:  All other systems negative  BP 128/72 (BP Location: Left Arm, Cuff Size: Normal)   Pulse 100   Temp 97.7 F (36.5 C) (Temporal)   Ht 5\' 4"  (1.626 m)   Wt 241 lb 12.8 oz (109.7 kg)   SpO2 96%   BMI 41.50 kg/m     PHYSICAL EXAM  General Appearance: No distress  EYES PERRLA, EOM intact.   NECK Supple, No JVD Pulmonary: normal breath sounds, No wheezing.  CardiovascularNormal S1,S2.  No m/r/g.   Abdomen: Benign, Soft, non-tender. Skin:   warm, no rashes, no ecchymosis  Extremities: normal, no cyanosis, clubbing. Neuro:without focal findings,  speech normal  PSYCHIATRIC: Mood, affect within normal limits.   ALL OTHER ROS ARE NEGATIVE      IMAGING    MR BRAIN/IAC W WO CONTRAST  Result Date: 05/11/2022 CLINICAL DATA:  42 year old female  Anosmia. EXAM: MRI HEAD WITHOUT AND WITH CONTRAST TECHNIQUE: Multiplanar, multiecho pulse sequences of the brain and surrounding structures were obtained without and with intravenous contrast. CONTRAST:  10 mL Vueway COMPARISON:  Previous [facility] brain MRI 01/13/2006 FINDINGS: Brain: Cerebral volume does not appear significantly changed since 2007, within normal limits. No restricted diffusion to suggest acute infarction. No midline shift, mass effect, evidence of mass lesion, ventriculomegaly, extra-axial collection or acute intracranial hemorrhage. Cervicomedullary junction and pituitary are within normal limits. Wallace Cullens and white matter signal is within normal limits throughout the brain. No encephalomalacia or chronic cerebral blood products identified. On axial T2 imaging (series 9, image 15), the inferior frontal gyri and olfactory bulbs appear symmetric and unremarkable. No evidence of abnormal intracranial  enhancement or dural thickening. Vascular: Major intracranial vascular flow voids are stable since 2007 and within normal limits. Skull and upper cervical spine: Negative. Visualized bone marrow signal is within normal limits. Sinuses/Orbits: Orbits appear symmetric and normal. Paranasal sinuses are clear aside from a chronic left maxillary mucous retention cyst which is smaller than in 2007. Nasal cavity appears unremarkable. Other: Dedicated thin slice internal auditory imaging is also provided. Normal cerebellopontine angles. Normal bilateral cisternal and intracanalicular 7th and 8th cranial nerve segments. Symmetric and normal T2 signal in the bilateral cochlea and vestibular structures. No abnormal enhancement identified. Mastoids are clear. Normal stylomastoid foramina. No skull base abnormality identified. Visible parotid glands appear normal. IMPRESSION: Normal MRI appearance of the Brain. Unremarkable nasal cavity and paranasal sinuses (small left maxillary retention cyst has decreased since 2007). Electronically Signed   By: Odessa Fleming M.D.   On: 05/11/2022 10:37      ASSESSMENT/PLAN   42 year old pleasant white female with obesity seen today for chronic cough for the past 4 years in the setting of previous history of asthma with underlying reflux disease deconditioned state and possible underlying sleep apnea with symptoms of excessive daytime sleepiness and snoring  Evaluation for sleep apnea Recommend home sleep study to assess  Cough Recommend chest x-ray Recommend pulmonary function testing At this time I suspect cough variant asthma and will start Symbicort inhaler Patient advised to rinse mouth after every use   GERD follow-up with GI continue PPI  Obesity -recommend significant weight loss -recommend changing diet  Deconditioned state -Recommend increased daily activity and exercise  ENT Follow-up as scheduled   MEDICATION ADJUSTMENTS/LABS AND TESTS ORDERED: Avoid  secondhand smoke Avoid SICK contacts Recommend  Masking  when appropriate Recommend Keep up-to-date with vaccinations  Follow-up ENT recommendations with nasal sprays Obtain chest x-ray to assess lung fields Obtain pulmonary function test to assess lung function Continue Prilosec for reflux disease  Obtain home sleep study to assess for sleep apnea Start Symbicort inhaler 2 times daily please rinse mouth after every use Continue to use albuterol inhaler as needed  Recommend weight loss   CURRENT MEDICATIONS REVIEWED AT LENGTH WITH PATIENT TODAY   Patient  satisfied with Plan of action and management. All questions answered  Follow up  3-4 months  Total Time Spent  50 mins   Wallis Bamberg Santiago Glad, M.D.  [facility]  Medical Director [facility] Medical Director [facility] Cardio-Pulmonary Department

## 2022-06-04 NOTE — Patient Instructions (Addendum)
Avoid secondhand smoke Avoid SICK contacts Recommend  Masking  when appropriate Recommend Keep up-to-date with vaccinations  Follow-up ENT recommendations with nasal sprays Obtain chest x-ray to assess lung fields Obtain pulmonary function test to assess lung function Continue Prilosec for reflux disease  Obtain home sleep study to assess for sleep apnea Start Symbicort inhaler 2 times daily please rinse mouth after every use Continue to use albuterol inhaler as needed  Recommend weight loss

## 2022-06-05 ENCOUNTER — Other Ambulatory Visit (HOSPITAL_COMMUNITY): Payer: Self-pay

## 2022-06-08 ENCOUNTER — Telehealth: Payer: Self-pay

## 2022-06-08 NOTE — Telephone Encounter (Signed)
-----   Message from Erin Fulling, MD sent at 06/08/2022  8:54 AM EDT ----- CXR shows NL looking lungs ----- Message ----- From: Interface, Rad Results In Sent: 06/07/2022   2:55 PM EDT To: Erin Fulling, MD

## 2022-06-08 NOTE — Telephone Encounter (Signed)
Patient is aware of results and voiced her understanding.  Nothing further needed.   

## 2022-06-12 ENCOUNTER — Telehealth: Payer: Self-pay | Admitting: Family Medicine

## 2022-06-12 ENCOUNTER — Encounter: Payer: Self-pay | Admitting: Family Medicine

## 2022-06-12 ENCOUNTER — Ambulatory Visit: Payer: Managed Care, Other (non HMO) | Admitting: Family Medicine

## 2022-06-12 VITALS — BP 122/80 | HR 96 | Temp 97.9°F | Resp 17 | Ht 64.0 in | Wt 244.0 lb

## 2022-06-12 DIAGNOSIS — M545 Low back pain, unspecified: Secondary | ICD-10-CM | POA: Diagnosis not present

## 2022-06-12 DIAGNOSIS — F5104 Psychophysiologic insomnia: Secondary | ICD-10-CM

## 2022-06-12 DIAGNOSIS — R439 Unspecified disturbances of smell and taste: Secondary | ICD-10-CM

## 2022-06-12 DIAGNOSIS — F419 Anxiety disorder, unspecified: Secondary | ICD-10-CM

## 2022-06-12 DIAGNOSIS — F32A Depression, unspecified: Secondary | ICD-10-CM

## 2022-06-12 DIAGNOSIS — E669 Obesity, unspecified: Secondary | ICD-10-CM

## 2022-06-12 NOTE — Progress Notes (Signed)
Margaret Alar, MD Phone: [phone]  Margaret Silva is a 42 y.o. female who presents today for follow-up.  Insomnia: Patient continues on Ambien.  She notes this is beneficial.  She gets 8 hours of sleep with this.  No drowsiness the next day.  Anxiety/depression: Patient notes no anxiety or depression.  She continues on Cymbalta.  No SI.  Chronic back pain: Patient notes she is seeing a chiropractor at this point with some benefit.  Obesity: Patient notes that her exercise is limited by her back.  Her diet is limited by lack of taste and smell.  Lack of taste and smell: Patient reports she has seen ENT for this and they have her doing nasal rinses.  She has also been referred to pulmonology for asthma.  Social History   Tobacco Use  Smoking Status Never   Passive exposure: Past  Smokeless Tobacco Never    Current Outpatient Medications on File Prior to Visit  Medication Sig Dispense Refill   albuterol (VENTOLIN HFA) 108 (90 Base) MCG/ACT inhaler Inhale 2 puffs into the lungs every 4 (four) hours as needed for wheezing or shortness of breath. 18 each 0   amitriptyline (ELAVIL) 50 MG tablet TAKE ONE TABLET (50 MG) BY MOUTH AT BEDTIME 90 tablet 4   Atogepant (QULIPTA) 60 MG TABS Take 60 mg by mouth daily. 30 tablet 11   Azelastine-Fluticasone 137-50 MCG/ACT SUSP Place 1 spray into both nostrils 2 (two) times daily.     budesonide-formoterol (SYMBICORT) 160-4.5 MCG/ACT inhaler Inhale 2 puffs into the lungs 2 (two) times daily. 1 each 12   Celecoxib (ELYXYB) 120 MG/4.8ML SOLN Take 120 mg by mouth daily as needed. 28.8 mL 11   Cholecalciferol (VITAMIN D PO) Take 5,000 Int'l Units by mouth once a week.     Clindamycin-Benzoyl Per, Refr, (DUAC) gel Apply to affected areas once daily 45 g 2   cyclobenzaprine (FLEXERIL) 10 MG tablet Take 1 tablet (10 mg total) by mouth at bedtime. 90 tablet 3   diclofenac (CATAFLAM) 50 MG tablet Take 50 mg by mouth daily.     diclofenac sodium  (VOLTAREN) 1 % GEL Apply 4 g topically 3 (three) times daily as needed.     divalproex (DEPAKOTE) 500 MG DR tablet TAKE ONE TABLET (500 MG) BY MOUTH EVERY DAY 90 tablet 4   DULoxetine (CYMBALTA) 30 MG capsule TAKE 3 CAPSULES BY MOUTH DAILY 270 capsule 1   EPINEPHrine 0.3 mg/0.3 mL IJ SOAJ injection Inject into the muscle.     fluticasone (FLONASE) 50 MCG/ACT nasal spray Place 2 sprays into both nostrils daily. 16 g 6   gentamicin cream (GARAMYCIN) 0.1 % Apply 1 application topically as needed (for nose).     ketoconazole (NIZORAL) 2 % cream Apply to crease of nose twice daily as needed for flake and itch. 60 g 1   loratadine (CLARITIN) 10 MG tablet Take 1 tablet (10 mg total) by mouth daily. 90 tablet 3   medroxyPROGESTERone (DEPO-PROVERA) 150 MG/ML injection Inject 1 mL (150 mg total) into the muscle every 3 (three) months. 1 mL 4   methylPREDNISolone Acetate (DEPO-MEDROL IJ) Inject as directed every 3 (three) months. First injection June 2023     omeprazole (PRILOSEC) 20 MG capsule Take 1 capsule (20 mg total) by mouth 2 (two) times daily before a meal. 180 capsule 3   ondansetron (ZOFRAN) 4 MG tablet Take 1 tablet (4 mg total) by mouth every 6 (six) hours. 30 tablet 0   ondansetron (  ZOFRAN-ODT) 4 MG disintegrating tablet Take 1 tablet (4 mg total) by mouth every 8 (eight) hours as needed for nausea. 30 tablet 12   SUMAtriptan (IMITREX) 100 MG tablet Take 1 tablet (100 mg total) by mouth once as needed for up to 1 dose. May repeat in 2 hours if headache persists or recurs. 10 tablet 12   tirzepatide (ZEPBOUND) 5 MG/0.5ML Pen Inject 5 mg into the skin once a week. Start after you complete the 28 days of the 2.5 mg dose. 6 mL 1   Ubrogepant (UBRELVY) 100 MG TABS Take 100 mg by mouth every 2 (two) hours as needed. Maximum 200mg  a day. 16 tablet 11   valACYclovir (VALTREX) 500 MG tablet TAKE 1 TABLET(500 MG) BY MOUTH DAILY 30 tablet 3   [START ON 06/18/2022] zolpidem (AMBIEN CR) 6.25 MG CR tablet TAKE 1  TABLET BY MOUTH AT BEDTIME AS NEEDED FOR SLEEP 30 tablet 1   Current Facility-Administered Medications on File Prior to Visit  Medication Dose Route Frequency Provider Last Rate Last Admin   medroxyPROGESTERone (DEPO-PROVERA) injection 150 mg  150 mg Intramuscular Q90 days Federico Flake, MD   150 mg at 12/15/21 1621   medroxyPROGESTERone (DEPO-PROVERA) injection 150 mg  150 mg Intramuscular Q90 days Anyanwu, Ugonna A, MD   150 mg at 03/10/22 1605     ROS see history of present illness  Objective  Physical Exam Vitals:   06/12/22 1612  BP: 122/80  Pulse: 96  Resp: 17  Temp: 97.9 F (36.6 C)  SpO2: 99%    BP Readings from Last 3 Encounters:  06/12/22 122/80  06/04/22 128/72  03/11/22 115/70   Wt Readings from Last 3 Encounters:  06/12/22 244 lb (110.7 kg)  06/04/22 241 lb 12.8 oz (109.7 kg)  03/11/22 236 lb 3.2 oz (107.1 kg)    Physical Exam Constitutional:      General: She is not in acute distress.    Appearance: She is not diaphoretic.  Cardiovascular:     Rate and Rhythm: Normal rate and regular rhythm.     Heart sounds: Normal heart sounds.  Pulmonary:     Effort: Pulmonary effort is normal.     Breath sounds: Normal breath sounds.  Skin:    General: Skin is warm and dry.  Neurological:     Mental Status: She is alert.      Assessment/Plan: Please see individual problem list.  Obesity (BMI 30.0-34.9) Assessment & Plan: Encouraged a healthier diet.  Exercise is limited by her back issues.  Will confirm type of thyroid cancer patient's sister had prior to prescribing Wegovy.  Consider restarting Wegovy once we confirm what kind of thyroid cancer her sister had.  Patient has been on this previously with good benefit and only had some mild nausea when she appropriately tapered up on it.   Disturbances of sensation of smell and taste Assessment & Plan: Chronic issue.  She will continue to see ENT.   Chronic insomnia Assessment & Plan: Chronic  issue.  Adequately controlled.  She will continue Ambien 6.25 mg nightly as needed for sleep.   Anxiety and depression Assessment & Plan: Chronic issue.  Asymptomatic.  She will continue Cymbalta 90 mg daily.   Acute bilateral low back pain, unspecified whether sciatica present Assessment & Plan: Chronic issue.  She will monitor.    Return in about 3 months (around 09/11/2022).   Margaret Alar, MD [facility]

## 2022-06-12 NOTE — Telephone Encounter (Signed)
LVMTCB

## 2022-06-12 NOTE — Telephone Encounter (Signed)
Please call the patient and see if she can confirm what kind of thyroid cancer her sister had.  I think we may have had her confirm this previously though I would like this confirmed 1 more time given that I am planning on prescribing Wegovy.

## 2022-06-12 NOTE — Assessment & Plan Note (Addendum)
Encouraged a healthier diet.  Exercise is limited by her back issues.  Will confirm type of thyroid cancer patient's sister had prior to prescribing Wegovy.  Consider restarting Wegovy once we confirm what kind of thyroid cancer her sister had.  Patient has been on this previously with good benefit and only had some mild nausea when she appropriately tapered up on it.

## 2022-06-12 NOTE — Assessment & Plan Note (Signed)
Chronic issue.  She will continue to see ENT.

## 2022-06-12 NOTE — Assessment & Plan Note (Signed)
Chronic issue.  She will monitor. 

## 2022-06-12 NOTE — Assessment & Plan Note (Signed)
Chronic issue.  Adequately controlled.  She will continue Ambien 6.25 mg nightly as needed for sleep.

## 2022-06-12 NOTE — Assessment & Plan Note (Signed)
Chronic issue.  Asymptomatic.  She will continue Cymbalta 90 mg daily.

## 2022-06-15 NOTE — Telephone Encounter (Signed)
Patient states she is returning our call.  I read Dr. Bernardo Heater message to patient.  Patient states she does not remember the name of the cancer her sister had at the moment, but it's the kind that's ok with this medication.

## 2022-06-15 NOTE — Telephone Encounter (Signed)
Unable to reach patient. Unable to leave voicemail.  

## 2022-06-16 MED ORDER — SEMAGLUTIDE-WEIGHT MANAGEMENT 2.4 MG/0.75ML ~~LOC~~ SOAJ
2.4000 mg | SUBCUTANEOUS | 2 refills | Status: DC
Start: 2022-10-10 — End: 2022-07-10

## 2022-06-16 MED ORDER — SEMAGLUTIDE-WEIGHT MANAGEMENT 0.5 MG/0.5ML ~~LOC~~ SOAJ
0.5000 mg | SUBCUTANEOUS | 0 refills | Status: DC
Start: 2022-07-15 — End: 2022-07-10

## 2022-06-16 MED ORDER — SEMAGLUTIDE-WEIGHT MANAGEMENT 0.25 MG/0.5ML ~~LOC~~ SOAJ
0.2500 mg | SUBCUTANEOUS | 0 refills | Status: AC
Start: 2022-06-16 — End: 2022-07-14

## 2022-06-16 MED ORDER — SEMAGLUTIDE-WEIGHT MANAGEMENT 1 MG/0.5ML ~~LOC~~ SOAJ
1.0000 mg | SUBCUTANEOUS | 0 refills | Status: DC
Start: 2022-08-13 — End: 2022-07-10

## 2022-06-16 MED ORDER — SEMAGLUTIDE-WEIGHT MANAGEMENT 1.7 MG/0.75ML ~~LOC~~ SOAJ
1.7000 mg | SUBCUTANEOUS | 0 refills | Status: DC
Start: 2022-09-11 — End: 2022-07-10

## 2022-06-16 NOTE — Addendum Note (Signed)
Addended by: Glori Luis on: 06/16/2022 11:31 AM   Modules accepted: Orders

## 2022-06-16 NOTE — Telephone Encounter (Addendum)
Noted.  I found a message where she previously confirmed that her sister had papillary thyroid cancer.  We are okay to proceed with [facility].  I sent this to the pharmacy.

## 2022-06-16 NOTE — Telephone Encounter (Signed)
Prescription sent to Pharmacy.  

## 2022-06-25 ENCOUNTER — Other Ambulatory Visit (HOSPITAL_COMMUNITY): Payer: Self-pay

## 2022-07-03 ENCOUNTER — Other Ambulatory Visit (HOSPITAL_COMMUNITY): Payer: Self-pay

## 2022-07-03 ENCOUNTER — Telehealth: Payer: Self-pay

## 2022-07-03 NOTE — Telephone Encounter (Signed)
Patient Advocate Encounter   Received notification from [employer] that prior authorization is required for Qulipta 60MG  tablets   Submitted: 07-03-2022 Key [id number]  Status is pending

## 2022-07-08 DIAGNOSIS — G4719 Other hypersomnia: Secondary | ICD-10-CM

## 2022-07-08 DIAGNOSIS — G4733 Obstructive sleep apnea (adult) (pediatric): Secondary | ICD-10-CM | POA: Diagnosis not present

## 2022-07-09 ENCOUNTER — Other Ambulatory Visit: Payer: Self-pay

## 2022-07-09 ENCOUNTER — Encounter: Payer: Self-pay | Admitting: Family Medicine

## 2022-07-09 NOTE — Telephone Encounter (Signed)
Patient Advocate Encounter  Prior Authorization for Qulipta 60MG  tablets has been approved.    PA# [id number] Insurance [employer] Electronic Prior Authorization Form Effective dates: 07/03/2022 through 07/03/2023

## 2022-07-10 ENCOUNTER — Encounter: Payer: Self-pay | Admitting: Pharmacist

## 2022-07-10 ENCOUNTER — Telehealth: Payer: Self-pay

## 2022-07-10 ENCOUNTER — Other Ambulatory Visit: Payer: Self-pay

## 2022-07-10 MED ORDER — SEMAGLUTIDE-WEIGHT MANAGEMENT 1.7 MG/0.75ML ~~LOC~~ SOAJ
1.7000 mg | SUBCUTANEOUS | 0 refills | Status: AC
Start: 2022-09-11 — End: 2023-01-07
  Filled 2022-07-10 – 2022-12-08 (×2): qty 3, 28d supply, fill #0

## 2022-07-10 MED ORDER — SEMAGLUTIDE-WEIGHT MANAGEMENT 1 MG/0.5ML ~~LOC~~ SOAJ
1.0000 mg | SUBCUTANEOUS | 0 refills | Status: AC
Start: 2022-08-13 — End: 2022-12-10
  Filled 2022-07-10 – 2022-11-10 (×3): qty 2, 28d supply, fill #0

## 2022-07-10 MED ORDER — SEMAGLUTIDE-WEIGHT MANAGEMENT 0.5 MG/0.5ML ~~LOC~~ SOAJ
0.5000 mg | SUBCUTANEOUS | 0 refills | Status: DC
Start: 2022-07-15 — End: 2022-10-12
  Filled 2022-07-10 – 2022-09-11 (×3): qty 2, 28d supply, fill #0

## 2022-07-10 MED ORDER — SEMAGLUTIDE-WEIGHT MANAGEMENT 2.4 MG/0.75ML ~~LOC~~ SOAJ
2.4000 mg | SUBCUTANEOUS | 2 refills | Status: DC
Start: 2022-10-10 — End: 2023-02-23
  Filled 2022-07-10: qty 3, fill #0
  Filled 2023-02-11: qty 3, 28d supply, fill #0

## 2022-07-10 NOTE — Telephone Encounter (Signed)
Per pharmacy [facility] requires PA.  PA started via CMM Key: [id number]  Per CMM PA has been DENIED. Appeal filed via CMM. Review pending.

## 2022-07-14 ENCOUNTER — Telehealth: Payer: Self-pay

## 2022-07-14 DIAGNOSIS — J4540 Moderate persistent asthma, uncomplicated: Secondary | ICD-10-CM

## 2022-07-14 NOTE — Telephone Encounter (Signed)
-----   Message from Erin Fulling, MD sent at 07/13/2022  7:24 AM EDT ----- No significant evidence of Sleep APnea but snoring may cause low oxygen levels Recommend ONO on RA  ----- Message ----- From: Lanna Poche D Sent: 07/08/2022   3:02 PM EDT To: Erin Fulling, MD

## 2022-07-14 NOTE — Telephone Encounter (Signed)
Patient is aware of results and voiced her understanding.  She agrees with ONO.  Order has been placed.  Nothing further needed.

## 2022-07-20 ENCOUNTER — Other Ambulatory Visit: Payer: Self-pay

## 2022-07-20 NOTE — Telephone Encounter (Signed)
Unfortunately, we currently do not have a Pharmacist to review denials, therefore you will need to process appeals accordingly as needed. Thanks for your support at this time.

## 2022-07-21 ENCOUNTER — Telehealth: Payer: Managed Care, Other (non HMO) | Admitting: Physician Assistant

## 2022-07-21 DIAGNOSIS — R3989 Other symptoms and signs involving the genitourinary system: Secondary | ICD-10-CM

## 2022-07-21 MED ORDER — CEPHALEXIN 500 MG PO CAPS: 500.0000 mg | ORAL_CAPSULE | Freq: Two times a day (BID) | ORAL | 0 refills | Status: AC

## 2022-07-21 NOTE — Progress Notes (Signed)
I have spent 5 minutes in review of e-visit questionnaire, review and updating patient chart, medical decision making and response to patient.   Alzina Golda Cody Alencia Gordon, PA-C    

## 2022-07-21 NOTE — Progress Notes (Signed)

## 2022-07-24 ENCOUNTER — Other Ambulatory Visit (HOSPITAL_COMMUNITY): Payer: Self-pay

## 2022-07-24 NOTE — Telephone Encounter (Signed)
Patient Advocate Encounter  Prior Authorization Appeal for [employer] 0.25MG /0.5ML Inj has been approved.    Effective: 06-25-2022 to 02-20-2023

## 2022-07-27 ENCOUNTER — Encounter: Payer: Self-pay | Admitting: Family Medicine

## 2022-07-27 ENCOUNTER — Other Ambulatory Visit: Payer: Self-pay

## 2022-07-28 ENCOUNTER — Other Ambulatory Visit: Payer: Self-pay

## 2022-07-28 MED ORDER — WEGOVY 0.25 MG/0.5ML ~~LOC~~ SOAJ
0.2500 mg | SUBCUTANEOUS | 0 refills | Status: DC
  Filled 2022-07-28 – 2022-08-11 (×5): qty 2, 28d supply, fill #0

## 2022-07-28 MED ORDER — SEMAGLUTIDE-WEIGHT MANAGEMENT 0.25 MG/0.5ML ~~LOC~~ SOAJ
0.2500 mg | Freq: Once | SUBCUTANEOUS | Status: DC
Start: 2022-07-28 — End: 2022-07-28

## 2022-07-28 NOTE — Addendum Note (Signed)
Addended by: Donavan Foil on: 07/28/2022 11:54 AM   Modules accepted: Orders

## 2022-07-28 NOTE — Telephone Encounter (Signed)
Sent my chart message to notify pt.

## 2022-07-29 ENCOUNTER — Other Ambulatory Visit: Payer: Self-pay

## 2022-07-30 ENCOUNTER — Other Ambulatory Visit: Payer: Self-pay

## 2022-07-31 ENCOUNTER — Other Ambulatory Visit: Payer: Self-pay

## 2022-08-11 ENCOUNTER — Ambulatory Visit: Payer: Managed Care, Other (non HMO)

## 2022-08-11 ENCOUNTER — Other Ambulatory Visit: Payer: Self-pay

## 2022-08-11 VITALS — BP 135/83 | HR 105 | Wt 244.0 lb

## 2022-08-11 DIAGNOSIS — Z3202 Encounter for pregnancy test, result negative: Secondary | ICD-10-CM

## 2022-08-11 DIAGNOSIS — Z3042 Encounter for surveillance of injectable contraceptive: Secondary | ICD-10-CM

## 2022-08-11 LAB — POCT URINE PREGNANCY: Preg Test, Ur: NEGATIVE

## 2022-08-11 MED ORDER — MEDROXYPROGESTERONE ACETATE 150 MG/ML IM SUSY
150.0000 mg | PREFILLED_SYRINGE | INTRAMUSCULAR | Status: AC
Start: 2022-08-11 — End: ?
  Administered 2022-08-11 – 2023-12-08 (×2): 150 mg via INTRAMUSCULAR

## 2022-08-11 NOTE — Progress Notes (Signed)
RGYN here for Depo Restart. No unprotected/ intercourse in 1 yr.  Date last pap: 06-18-21. Last Depo-Provera: 03/10/2022. Side Effects if any: None Serum HCG indicated? N/A. Depo-Provera 150 mg IM given by: Toya L CMA . Next appointment due 09/10-09/24.

## 2022-08-13 ENCOUNTER — Telehealth: Payer: Self-pay

## 2022-08-13 NOTE — Telephone Encounter (Signed)
ONO reviewed by Dr. Belia Heman- no need for oxygen.  Lm for patient.

## 2022-08-14 ENCOUNTER — Telehealth: Payer: Self-pay | Admitting: Internal Medicine

## 2022-08-14 NOTE — Telephone Encounter (Signed)
I notified the patient of her ONO results.  Nothing further needed.

## 2022-08-14 NOTE — Telephone Encounter (Signed)
See results message from 08/13/2022.  ATC the patient. LVM for the patient to return my call.

## 2022-08-14 NOTE — Telephone Encounter (Signed)
Patient is returning phone call. Patient phone number is [phone].

## 2022-08-14 NOTE — Telephone Encounter (Signed)
Lm x2 for patient.  Letter mailed to address on file.  Nothing further needed.

## 2022-08-16 ENCOUNTER — Other Ambulatory Visit: Payer: Self-pay | Admitting: Family Medicine

## 2022-08-24 NOTE — Telephone Encounter (Signed)
The PFTs are done at the [facility] Entrance that is where the statue is located. You would need to be here 15 minutes before your appt.

## 2022-08-26 ENCOUNTER — Other Ambulatory Visit: Payer: Self-pay | Admitting: Family Medicine

## 2022-08-26 DIAGNOSIS — F5104 Psychophysiologic insomnia: Secondary | ICD-10-CM

## 2022-08-31 ENCOUNTER — Other Ambulatory Visit: Payer: Self-pay | Admitting: Family Medicine

## 2022-08-31 DIAGNOSIS — B009 Herpesviral infection, unspecified: Secondary | ICD-10-CM

## 2022-09-08 ENCOUNTER — Ambulatory Visit: Payer: Managed Care, Other (non HMO) | Attending: Internal Medicine

## 2022-09-08 DIAGNOSIS — G4719 Other hypersomnia: Secondary | ICD-10-CM

## 2022-09-08 DIAGNOSIS — R0602 Shortness of breath: Secondary | ICD-10-CM | POA: Insufficient documentation

## 2022-09-08 DIAGNOSIS — J4540 Moderate persistent asthma, uncomplicated: Secondary | ICD-10-CM

## 2022-09-08 MED ORDER — ALBUTEROL SULFATE (2.5 MG/3ML) 0.083% IN NEBU
2.5000 mg | INHALATION_SOLUTION | Freq: Once | RESPIRATORY_TRACT | Status: AC
  Administered 2022-09-08: 2.5 mg via RESPIRATORY_TRACT
  Filled 2022-09-08: qty 3

## 2022-09-11 ENCOUNTER — Ambulatory Visit: Payer: Managed Care, Other (non HMO) | Admitting: Family Medicine

## 2022-09-11 ENCOUNTER — Encounter: Payer: Self-pay | Admitting: Family Medicine

## 2022-09-11 ENCOUNTER — Other Ambulatory Visit: Payer: Self-pay | Admitting: Family Medicine

## 2022-09-11 ENCOUNTER — Other Ambulatory Visit: Payer: Self-pay

## 2022-09-11 DIAGNOSIS — F5104 Psychophysiologic insomnia: Secondary | ICD-10-CM | POA: Diagnosis not present

## 2022-09-11 MED ORDER — ZOLPIDEM TARTRATE ER 6.25 MG PO TBCR
6.2500 mg | EXTENDED_RELEASE_TABLET | Freq: Every evening | ORAL | 2 refills | Status: DC | PRN
Start: 2022-09-11 — End: 2022-12-08
  Filled 2022-09-11: qty 30, 30d supply, fill #0
  Filled 2022-10-12: qty 30, 30d supply, fill #1
  Filled 2022-11-10: qty 30, 30d supply, fill #2

## 2022-09-11 NOTE — Assessment & Plan Note (Signed)
Chronic issue.  Well-controlled.  Continue Ambien CR 6.25 mg nightly as needed for sleep.

## 2022-09-11 NOTE — Progress Notes (Signed)
Marikay Alar, MD Phone: [phone]  Margaret Silva is a 42 y.o. female who presents today for f/u.  Insomnia: Continues on Ambien.  She notes 8.5 hours of sleep nightly.  No drowsiness the next day.  If she does not take the Ambien she cannot sleep.  Obesity: Patient notes she is down about 11 pounds on the 0.25 mg dose of Wegovy.  She is due to increase the dose.  She will have [place] 2 times a week.  She typically eats steak and Post[place] and presser at stables.  She has issues with 1 large bottle per week.  Sweet tea 2-3 times a week.  Minimizes sweets and fried foods.  Her diet is limited by her lack of taste and smell from having had COVID.  She has started to walk for exercise.  Social History   Tobacco Use  Smoking Status Never   Passive exposure: Past  Smokeless Tobacco Never    Current Outpatient Medications on File Prior to Visit  Medication Sig Dispense Refill   albuterol (VENTOLIN HFA) 108 (90 Base) MCG/ACT inhaler Inhale 2 puffs into the lungs every 4 (four) hours as needed for wheezing or shortness of breath. 18 each 0   amitriptyline (ELAVIL) 50 MG tablet TAKE ONE TABLET (50 MG) BY MOUTH AT BEDTIME 90 tablet 4   Atogepant (QULIPTA) 60 MG TABS Take 60 mg by mouth daily. 30 tablet 11   Azelastine-Fluticasone 137-50 MCG/ACT SUSP Place 1 spray into both nostrils 2 (two) times daily.     budesonide-formoterol (SYMBICORT) 160-4.5 MCG/ACT inhaler Inhale 2 puffs into the lungs 2 (two) times daily. 1 each 12   Celecoxib (ELYXYB) 120 MG/4.8ML SOLN Take 120 mg by mouth daily as needed. 28.8 mL 11   Cholecalciferol (VITAMIN D PO) Take 5,000 Int'l Units by mouth once a week.     Clindamycin-Benzoyl Per, Refr, (DUAC) gel Apply to affected areas once daily 45 g 2   cyclobenzaprine (FLEXERIL) 10 MG tablet Take 1 tablet (10 mg total) by mouth at bedtime. 90 tablet 3   diclofenac (CATAFLAM) 50 MG tablet Take 50 mg by mouth daily.     diclofenac sodium (VOLTAREN) 1 % GEL Apply  4 g topically 3 (three) times daily as needed.     divalproex (DEPAKOTE) 500 MG DR tablet TAKE ONE TABLET (500 MG) BY MOUTH EVERY DAY 90 tablet 4   DULoxetine (CYMBALTA) 30 MG capsule TAKE 3 CAPSULES BY MOUTH DAILY 270 capsule 1   EPINEPHrine 0.3 mg/0.3 mL IJ SOAJ injection Inject into the muscle.     fluticasone (FLONASE) 50 MCG/ACT nasal spray Place 2 sprays into both nostrils daily. 16 g 6   gentamicin cream (GARAMYCIN) 0.1 % Apply 1 application topically as needed (for nose).     ketoconazole (NIZORAL) 2 % cream Apply to crease of nose twice daily as needed for flake and itch. 60 g 1   loratadine (CLARITIN) 10 MG tablet Take 1 tablet (10 mg total) by mouth daily. 90 tablet 3   medroxyPROGESTERone (DEPO-PROVERA) 150 MG/ML injection Inject 1 mL (150 mg total) into the muscle every 3 (three) months. 1 mL 4   methylPREDNISolone Acetate (DEPO-MEDROL IJ) Inject as directed every 3 (three) months. First injection June 2023     omeprazole (PRILOSEC) 20 MG capsule TAKE 1 CAPSULE(20 MG) BY MOUTH DAILY 90 capsule 1   ondansetron (ZOFRAN) 4 MG tablet Take 1 tablet (4 mg total) by mouth every 6 (six) hours. 30 tablet 0   ondansetron (  ZOFRAN-ODT) 4 MG disintegrating tablet Take 1 tablet (4 mg total) by mouth every 8 (eight) hours as needed for nausea. 30 tablet 12   Semaglutide-Weight Management (WEGOVY) 0.25 MG/0.5ML SOAJ Inject 0.25 mg into the skin once a week. 2 mL 0   Semaglutide-Weight Management 0.5 MG/0.5ML SOAJ Inject 0.5 mg into the skin once a week for 28 days. 2 mL 0   Semaglutide-Weight Management 1.7 MG/0.75ML SOAJ Inject 1.7 mg into the skin once a week for 28 days. 3 mL 0   [START ON 10/10/2022] Semaglutide-Weight Management 2.4 MG/0.75ML SOAJ Inject 2.4 mg into the skin once a week. 3 mL 2   SUMAtriptan (IMITREX) 100 MG tablet Take 1 tablet (100 mg total) by mouth once as needed for up to 1 dose. May repeat in 2 hours if headache persists or recurs. 10 tablet 12   Ubrogepant (UBRELVY) 100 MG  TABS Take 100 mg by mouth every 2 (two) hours as needed. Maximum 200mg  a day. 16 tablet 11   valACYclovir (VALTREX) 500 MG tablet TAKE 1 TABLET(500 MG) BY MOUTH DAILY 30 tablet 3   Current Facility-Administered Medications on File Prior to Visit  Medication Dose Route Frequency Provider Last Rate Last Admin   medroxyPROGESTERone (DEPO-PROVERA) injection 150 mg  150 mg Intramuscular Q90 days Federico Flake, MD   150 mg at 12/15/21 1621   medroxyPROGESTERone (DEPO-PROVERA) injection 150 mg  150 mg Intramuscular Q90 days Anyanwu, Ugonna A, MD   150 mg at 03/10/22 1605   medroxyPROGESTERone Acetate SUSY 150 mg  150 mg Intramuscular Q90 days Karnak Bing, MD   150 mg at 08/11/22 1413     ROS see history of present illness  Objective  Physical Exam Vitals:   09/11/22 1605  BP: 118/76  Pulse: 91  Temp: 97.9 F (36.6 C)  SpO2: 96%    BP Readings from Last 3 Encounters:  09/11/22 118/76  08/11/22 135/83  06/12/22 122/80   Wt Readings from Last 3 Encounters:  09/11/22 238 lb 3.2 oz (108 kg)  08/11/22 244 lb (110.7 kg)  06/12/22 244 lb (110.7 kg)    Physical Exam Constitutional:      General: She is not in acute distress.    Appearance: She is not diaphoretic.  Cardiovascular:     Rate and Rhythm: Normal rate and regular rhythm.     Heart sounds: Normal heart sounds.  Pulmonary:     Effort: Pulmonary effort is normal.     Breath sounds: Normal breath sounds.  Skin:    General: Skin is warm and dry.  Neurological:     Mental Status: She is alert.      Assessment/Plan: Please see individual problem list.  Chronic insomnia Assessment & Plan: Chronic issue.  Well-controlled.  Continue Ambien CR 6.25 mg nightly as needed for sleep.  Orders: -     Zolpidem Tartrate ER; TAKE 1 TABLET BY MOUTH AT BEDTIME AS NEEDED FOR SLEEP  Dispense: 30 tablet; Refill: 2  Morbid obesity (HCC) Assessment & Plan: Chronic issue.  Weight has trended down some.  Patient will  increase Wegovy to 0.5 mg weekly.  Discussed ramping up on this appropriately.  Continue to walk for exercise.  Discussed reducing eating out and juice intake.     Return in about 3 months (around 12/12/2022) for Insomnia.   Marikay Alar, MD [facility]

## 2022-09-11 NOTE — Assessment & Plan Note (Signed)
Chronic issue.  Weight has trended down some.  Patient will increase Wegovy to 0.5 mg weekly.  Discussed ramping up on this appropriately.  Continue to walk for exercise.  Discussed reducing eating out and juice intake.

## 2022-09-11 NOTE — Patient Instructions (Signed)
Nice to see you. You can start on the Wegovy 0.5 mg dose for your next dose of this.  You will take this dose for 4 weeks.  You will then take the 1 mg dose for 4 weeks followed by the 1.7 mg dose for 4 weeks followed by the 2.4 mg dose on an ongoing basis.

## 2022-09-14 ENCOUNTER — Other Ambulatory Visit: Payer: Self-pay

## 2022-09-16 ENCOUNTER — Encounter: Payer: Managed Care, Other (non HMO) | Admitting: Dermatology

## 2022-09-17 ENCOUNTER — Ambulatory Visit: Payer: Managed Care, Other (non HMO) | Admitting: Internal Medicine

## 2022-09-17 ENCOUNTER — Encounter: Payer: Self-pay | Admitting: Internal Medicine

## 2022-09-17 VITALS — BP 124/70 | HR 97 | Temp 97.9°F | Ht 64.0 in | Wt 237.8 lb

## 2022-09-17 DIAGNOSIS — R053 Chronic cough: Secondary | ICD-10-CM | POA: Diagnosis not present

## 2022-09-17 NOTE — Progress Notes (Signed)
[facility] Pulmonary Medicine Consultation      Date: 09/17/2022,   MRN# [id number] Margaret Silva [birth year]  Tests HST 06/2022 AHI less than 5 no evidence of sleep apnea  Chest x-ray 05/2022 No acute process no active disease  PFTs 08/2022 No acute process No obstruction No restriction PFTs within normal limits  CHIEF COMPLAINT:  Follow-up assessment for cough  HISTORY OF PRESENT ILLNESS   42 year old pleasant white female seen today for chronic cough for 4 years Patient stated it started on a mission trip 4 years ago when she was exposed to termites Several years later in 2022 patient was diagnosed with COVID-19 infection Patient is an smoker  Patient has been diagnosed with childhood asthma Triggers include tree pollen exercise  Started on Symbicort at last visit All test previously prescribed HST PFT and chest x-ray are within normal limits      ENT EVALUATION Patient has been seen by ENT in the past by Dr. Andee Poles He recommends saline sprays 6 times a day per patient Patient does not take any type of intranasal steroids at this time   GI history 02/03/2022 : EGD: 2 non bleeding gastric ulcers. Esophagus was normal but empirically was dilated to 18 F.Biopsies showed no abnormalities. Gastric bx show reactive gastropathy .    H pylori breath test negative.  prilosec 2 times a day before meals, adhering to life style changes for gerd    Current weight 238 Recommend weight loss  Had extensive discussion with patient and mother The workup for her cough has not been revealing at this time suggesting a possible cough variant asthma related to underlying anxiety and stressful job with exposure to some irritants  At this time I would recommend obtaining CT chest to assess lung fields for further evaluation  I do not believe that Symbicort is helping Will use albuterol only as needed  No exacerbation at this time No evidence of heart failure at this  time No evidence or signs of infection at this time No respiratory distress No fevers, chills, nausea, vomiting, diarrhea No evidence of lower extremity edema No evidence hemoptysis   PAST MEDICAL HISTORY   Past Medical History:  Diagnosis Date   Asthma    Bursitis of left hip    Diverticulitis    h/o   Dysplastic nevus 09/03/2021   right lateral knee, moderate   Endometriosis    Fibromyalgia    GERD (gastroesophageal reflux disease)    Headache    History of chicken pox    History of colonoscopy 11/04 and April 06   History of laparoscopy 12/04 and 12/07   History of laparoscopy 01/29/2006   Dr. 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 but working on getting the diagnosis     SURGICAL HISTORY   Past Surgical History:  Procedure Laterality Date   CYSTOSCOPY WITH HYDRODISTENSION AND BIOPSY     DIAGNOSTIC LAPAROSCOPY  02/16/2003   ESOPHAGOGASTRODUODENOSCOPY (EGD) WITH PROPOFOL N/A 09/30/2018   Procedure: ESOPHAGOGASTRODUODENOSCOPY (EGD) WITH PROPOFOL;  Surgeon: Pasty Spillers, MD;  Location: [facility] ENDOSCOPY;  Service: Endoscopy;  Laterality: N/A;   ESOPHAGOGASTRODUODENOSCOPY (EGD) WITH PROPOFOL N/A 02/03/2022   Procedure: ESOPHAGOGASTRODUODENOSCOPY (EGD) WITH  PROPOFOL;  Surgeon: Wyline Mood, MD;  Location: [facility] ENDOSCOPY;  Service: Gastroenterology;  Laterality: N/A;   SIGMOIDOSCOPY  05/26/10     FAMILY HISTORY   Family History  Problem Relation Age of Onset   Fibromyalgia Mother    Migraines Mother    Cancer Mother 7       breast   Breast cancer Mother    Hypertension Father    Hyperlipidemia Father    Prostate cancer Father    Ehlers-Danlos syndrome Sister    Fibromyalgia Sister     Thyroid cancer Sister    Diabetes Maternal Grandmother    Arthritis Maternal Grandmother    Kidney disease Maternal Grandmother    Heart attack Maternal Grandfather    Heart disease Maternal Grandfather    Hypertension Maternal Grandfather    Mental illness Maternal Grandfather    Cancer Paternal Grandmother        Liver cancer/lung cancer   Diabetes Paternal Grandmother    Heart attack Paternal Grandfather    Cancer Paternal Grandfather        Pancreatic/ Bladder Cancer/prostate   Arthritis Paternal Grandfather    Heart disease Paternal Grandfather    Ovarian cancer Neg Hx    Colon cancer Neg Hx      SOCIAL HISTORY   Social History   Tobacco Use   Smoking status: Never    Passive exposure: Past   Smokeless tobacco: Never  Vaping Use   Vaping status: Never Used  Substance Use Topics   Alcohol use: Yes    Alcohol/week: 0.0 standard drinks of alcohol    Comment: occasionally   Drug use: No     MEDICATIONS    Home Medication:  Current Outpatient Rx   Order #: [id number] Class: Normal   Order #: [id number] Class: Normal   Order #: [id number] Class: Normal   Order #: [id number] Class: Historical Med   Order #: [id number] Class: Normal   Order #: [id number] Class: Normal   Order #: [id number] Class: Historical Med   Order #: [id number] Class: Normal   Order #: [id number] Class: Normal   Order #: [id number] Class: Historical Med   Order #: [id number] Class: Historical Med   Order #: [id number] Class: Normal   Order #: [id number] Class: Normal   Order #: [id number] Class: Historical Med   Order #: [id number] Class: Normal   Order #: [id number] Class: Historical Med   Order #: [id number] Class: Normal   Order #: [id number] Class: Normal   Order #: [id number] Class: Normal   Order #: [id number] Class: Historical Med   Order #: [id number] Class: Normal   Order #: [id number] Class: Normal   Order #: [id number] Class: Normal   Order #: [id number] Class: Normal   Order #: [id number] Class: Normal   Order #:  [id number] Class: Normal   [START ON 10/10/2022] Order #: [id number] Class: Normal   Order #: [id number] Class: Normal   Order #: [id number] Class: Normal   Order #: [id number] Class: Normal   Order #: [id number] Class: Normal    Current Medication:  Current Outpatient Medications:    albuterol (VENTOLIN HFA) 108 (90 Base) MCG/ACT inhaler, Inhale 2 puffs into the lungs every 4 (four) hours as needed for wheezing or shortness of breath., Disp: 18 each, Rfl: 0   amitriptyline (ELAVIL) 50 MG tablet, TAKE ONE TABLET (50 MG) BY MOUTH AT BEDTIME, Disp: 90 tablet, Rfl: 4   Atogepant (QULIPTA) 60 MG TABS, Take 60 mg by mouth daily., Disp: 30 tablet, Rfl: 11   Azelastine-Fluticasone 137-50 MCG/ACT SUSP, Place 1 spray into both nostrils 2 (two) times daily., Disp: , Rfl:    budesonide-formoterol (SYMBICORT) 160-4.5  MCG/ACT inhaler, Inhale 2 puffs into the lungs 2 (two) times daily., Disp: 1 each, Rfl: 12   Celecoxib (ELYXYB) 120 MG/4.8ML SOLN, Take 120 mg by mouth daily as needed., Disp: 28.8 mL, Rfl: 11   Cholecalciferol (VITAMIN D PO), Take 5,000 Int'l Units by mouth once a week., Disp: , Rfl:    Clindamycin-Benzoyl Per, Refr, (DUAC) gel, Apply to affected areas once daily, Disp: 45 g, Rfl: 2   cyclobenzaprine (FLEXERIL) 10 MG tablet, Take 1 tablet (10 mg total) by mouth at bedtime., Disp: 90 tablet, Rfl: 3   diclofenac (CATAFLAM) 50 MG tablet, Take 50 mg by mouth daily., Disp: , Rfl:    diclofenac sodium (VOLTAREN) 1 % GEL, Apply 4 g topically 3 (three) times daily as needed., Disp: , Rfl:    divalproex (DEPAKOTE) 500 MG DR tablet, TAKE ONE TABLET (500 MG) BY MOUTH EVERY DAY, Disp: 90 tablet, Rfl: 4   DULoxetine (CYMBALTA) 30 MG capsule, TAKE 3 CAPSULES BY MOUTH DAILY, Disp: 270 capsule, Rfl: 1   EPINEPHrine 0.3 mg/0.3 mL IJ SOAJ injection, Inject into the muscle., Disp: , Rfl:    fluticasone (FLONASE) 50 MCG/ACT nasal spray, Place 2 sprays into both nostrils daily., Disp: 16 g, Rfl: 6   gentamicin cream  (GARAMYCIN) 0.1 %, Apply 1 application topically as needed (for nose)., Disp: , Rfl:    ketoconazole (NIZORAL) 2 % cream, Apply to crease of nose twice daily as needed for flake and itch., Disp: 60 g, Rfl: 1   loratadine (CLARITIN) 10 MG tablet, Take 1 tablet (10 mg total) by mouth daily., Disp: 90 tablet, Rfl: 3   medroxyPROGESTERone (DEPO-PROVERA) 150 MG/ML injection, Inject 1 mL (150 mg total) into the muscle every 3 (three) months., Disp: 1 mL, Rfl: 4   methylPREDNISolone Acetate (DEPO-MEDROL IJ), Inject as directed every 3 (three) months. First injection June 2023, Disp: , Rfl:    omeprazole (PRILOSEC) 20 MG capsule, TAKE 1 CAPSULE(20 MG) BY MOUTH DAILY, Disp: 90 capsule, Rfl: 1   ondansetron (ZOFRAN) 4 MG tablet, Take 1 tablet (4 mg total) by mouth every 6 (six) hours., Disp: 30 tablet, Rfl: 0   ondansetron (ZOFRAN-ODT) 4 MG disintegrating tablet, Take 1 tablet (4 mg total) by mouth every 8 (eight) hours as needed for nausea., Disp: 30 tablet, Rfl: 12   Semaglutide-Weight Management (WEGOVY) 0.25 MG/0.5ML SOAJ, Inject 0.25 mg into the skin once a week., Disp: 2 mL, Rfl: 0   Semaglutide-Weight Management 0.5 MG/0.5ML SOAJ, Inject 0.5 mg into the skin once a week for 28 days., Disp: 2 mL, Rfl: 0   Semaglutide-Weight Management 1.7 MG/0.75ML SOAJ, Inject 1.7 mg into the skin once a week for 28 days., Disp: 3 mL, Rfl: 0   [START ON 10/10/2022] Semaglutide-Weight Management 2.4 MG/0.75ML SOAJ, Inject 2.4 mg into the skin once a week., Disp: 3 mL, Rfl: 2   SUMAtriptan (IMITREX) 100 MG tablet, Take 1 tablet (100 mg total) by mouth once as needed for up to 1 dose. May repeat in 2 hours if headache persists or recurs., Disp: 10 tablet, Rfl: 12   Ubrogepant (UBRELVY) 100 MG TABS, Take 100 mg by mouth every 2 (two) hours as needed. Maximum 200mg  a day., Disp: 16 tablet, Rfl: 11   valACYclovir (VALTREX) 500 MG tablet, TAKE 1 TABLET(500 MG) BY MOUTH DAILY, Disp: 30 tablet, Rfl: 3   zolpidem (AMBIEN CR) 6.25  MG CR tablet, Take 1 tablet (6.25 mg total) by mouth at bedtime as needed for sleep., Disp: 30  tablet, Rfl: 2  Current Facility-Administered Medications:    medroxyPROGESTERone (DEPO-PROVERA) injection 150 mg, 150 mg, Intramuscular, Q90 days, Federico Flake, MD, 150 mg at 12/15/21 1621   medroxyPROGESTERone (DEPO-PROVERA) injection 150 mg, 150 mg, Intramuscular, Q90 days, Anyanwu, Ugonna A, MD, 150 mg at 03/10/22 1605   medroxyPROGESTERone Acetate SUSY 150 mg, 150 mg, Intramuscular, Q90 days, Wimberley Bing, MD, 150 mg at 08/11/22 1413    ALLERGIES   Coffee bean extract  [coffea arabica], Other, and Trazodone and nefazodone     REVIEW OF SYSTEMS   BP 124/70 (BP Location: Left Arm, Cuff Size: Large)   Pulse 97   Temp 97.9 F (36.6 C) (Temporal)   Ht 5\' 4"  (1.626 m)   Wt 237 lb 12.8 oz (107.9 kg)   SpO2 98%   BMI 40.82 kg/m      Review of Systems: Gen:  Denies  fever, sweats, chills weight loss  HEENT: Denies blurred vision, double vision, ear pain, eye pain, hearing loss, nose bleeds, sore throat Cardiac:  No dizziness, chest pain or heaviness, chest tightness,edema, No JVD Resp:   + cough, -sputum production, -shortness of breath,-wheezing, -hemoptysis,  Other:  All other systems negative   Physical Examination:   General Appearance: No distress  EYES PERRLA, EOM intact.   NECK Supple, No JVD Pulmonary: normal breath sounds, No wheezing.  CardiovascularNormal S1,S2.  No m/r/g.   Abdomen: Benign, Soft, non-tender. Neurology UE/LE 5/5 strength, no focal deficits Ext pulses intact, cap refill intact ALL OTHER ROS ARE NEGATIVE       ASSESSMENT/PLAN   42 year old pleasant white female with obesity seen today for chronic cough for the past 4 years  At this time Home sleep study PFTs and chest x-ray did not reveal any abnormal findings I have explained to the patient and mother that we will need further testing which will include CT of the chest to assess  for any other pathology that is not seen on chest x-ray    Cough CT of the chest DC Symbicort at this time Continue albuterol as needed Avoid secondhand smoke Avoid SICK contacts Recommend  Masking  when appropriate Recommend Keep up-to-date with vaccinations    GERD follow-up with GI continue PPI   Obesity -recommend significant weight loss -recommend changing diet  Deconditioned state -Recommend increased daily activity and exercise  ENT Follow-up as scheduled   MEDICATION ADJUSTMENTS/LABS AND TESTS ORDERED: Avoid secondhand smoke Avoid SICK contacts Recommend  Masking  when appropriate Recommend Keep up-to-date with vaccinations  Follow-up ENT recommendations with nasal sprays Obtain CT of the chest Continue to use albuterol inhaler as needed  Recommend weight loss   CURRENT MEDICATIONS REVIEWED AT LENGTH WITH PATIENT TODAY   Patient  satisfied with Plan of action and management. All questions answered  Follow up  4 weeks months  Total Time Spent  45 mins   Wallis Bamberg Santiago Glad, M.D.  [facility]  Medical Director [facility] Medical Director [facility] Cardio-Pulmonary Department

## 2022-09-17 NOTE — Patient Instructions (Signed)
Patient with persistent chronic cough recommend CT of the chest  Continue albuterol as needed  Lets stop Symbicort  Avoid secondhand smoke Avoid SICK contacts Recommend  Masking  when appropriate Recommend Keep up-to-date with vaccinations

## 2022-09-29 ENCOUNTER — Ambulatory Visit
Admission: RE | Admit: 2022-09-29 | Discharge: 2022-09-29 | Disposition: A | Discharge status: Home / Self Care | Payer: Managed Care, Other (non HMO) | Source: Ambulatory Visit | Attending: Family Medicine | Admitting: Family Medicine

## 2022-09-29 DIAGNOSIS — R053 Chronic cough: Secondary | ICD-10-CM

## 2022-10-12 ENCOUNTER — Other Ambulatory Visit: Payer: Self-pay | Admitting: Family Medicine

## 2022-10-12 ENCOUNTER — Other Ambulatory Visit: Payer: Self-pay

## 2022-10-12 MED ORDER — WEGOVY 0.5 MG/0.5ML ~~LOC~~ SOAJ
0.5000 mg | SUBCUTANEOUS | 0 refills | Status: AC
Start: 2022-10-12 — End: 2022-11-13
  Filled 2022-10-12 – 2022-10-15 (×2): qty 2, 28d supply, fill #0

## 2022-10-14 ENCOUNTER — Telehealth: Payer: Self-pay

## 2022-10-14 ENCOUNTER — Other Ambulatory Visit (HOSPITAL_COMMUNITY): Payer: Self-pay

## 2022-10-14 ENCOUNTER — Other Ambulatory Visit: Payer: Self-pay

## 2022-10-14 NOTE — Telephone Encounter (Signed)
Pharmacy Patient Advocate Encounter   Received notification from CoverMyMeds that prior authorization for Ubrelvy 100MG  tablets is required/requested.   Insurance verification completed.   The patient is insured through [facility] .   Per test claim: PA required; PA submitted to [facility] via CoverMyMeds Key/confirmation #/EOC [id number] Status is pending

## 2022-10-15 ENCOUNTER — Other Ambulatory Visit: Payer: Self-pay

## 2022-10-16 ENCOUNTER — Other Ambulatory Visit (HOSPITAL_COMMUNITY): Payer: Self-pay

## 2022-10-16 ENCOUNTER — Other Ambulatory Visit: Payer: Self-pay | Admitting: Neurology

## 2022-10-16 ENCOUNTER — Ambulatory Visit (INDEPENDENT_AMBULATORY_CARE_PROVIDER_SITE_OTHER): Payer: Managed Care, Other (non HMO) | Admitting: Internal Medicine

## 2022-10-16 ENCOUNTER — Other Ambulatory Visit: Payer: Self-pay

## 2022-10-16 ENCOUNTER — Encounter: Payer: Self-pay | Admitting: Internal Medicine

## 2022-10-16 VITALS — BP 128/72 | HR 95 | Temp 98.4°F | Ht 64.0 in | Wt 233.4 lb

## 2022-10-16 DIAGNOSIS — J45991 Cough variant asthma: Secondary | ICD-10-CM | POA: Diagnosis not present

## 2022-10-16 NOTE — Progress Notes (Signed)
[facility] Consultation      Date: 10/16/2022,   MRN# [id number] DEARIE MCCAGE 1980-03-09  Tests HST 06/2022 AHI less than 5 no evidence of sleep apnea  Chest x-ray 05/2022 No acute process no active disease  PFTs 08/2022 No acute process No obstruction No restriction PFTs within normal limits  CHIEF COMPLAINT:  Follow-up assessment for cough  HISTORY OF PRESENT ILLNESS   42 year old pleasant white female seen today for chronic cough for 4 years Patient stated it started on a mission trip 4 years ago when she was exposed to termites Several years later in 2022 patient was diagnosed with COVID-19 infection Patient is an smoker  Patient has been diagnosed with childhood asthma Triggers include tree pollen exercise   All test previously prescribed HST, PFT, and chest x-ray are within normal limits CT of the chest reviewed in detail within normal limits no acute process Patient weaned off of Symbicort Patient's cough seems to have improved 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 Andee Poles He recommends saline sprays 6 times a day per patient Patient does not take any type of intranasal steroids at this time   GI history 02/03/2022 : EGD: 2 non bleeding gastric ulcers. Esophagus was normal but empirically was dilated to 18 F.Biopsies showed no abnormalities. Gastric bx show reactive gastropathy .    H pylori breath test negative.  prilosec 2 times a day before meals, adhering to life style changes for gerd   Current weight 238 Recommend weight loss  Had extensive discussion with patient and mother The workup for her cough has not been revealing at this time suggesting a possible cough variant asthma related to underlying  anxiety and stressful job with exposure to some irritants  CT chest does NOT reveal any significant lung abnormalities  I do not believe that Symbicort is helping Will use albuterol only as needed  No exacerbation at this time No evidence of heart failure at this time No evidence or signs of infection at this time No respiratory distress No fevers, chills, nausea, vomiting, diarrhea No evidence of lower extremity edema No evidence hemoptysis   PAST MEDICAL HISTORY   Past Medical History:  Diagnosis Date   Asthma    Bursitis of left hip    Diverticulitis    h/o   Dysplastic nevus 09/03/2021   right lateral knee, moderate   Endometriosis    Fibromyalgia    GERD (gastroesophageal reflux disease)    Headache    History of chicken pox    History of colonoscopy 11/04 and April 06   History of laparoscopy 12/04 and 12/07   History of laparoscopy 01/29/2006   Dr. 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 but working on getting the diagnosis     SURGICAL HISTORY   Past Surgical History:  Procedure Laterality Date   CYSTOSCOPY WITH HYDRODISTENSION AND BIOPSY     DIAGNOSTIC LAPAROSCOPY  02/16/2003  ESOPHAGOGASTRODUODENOSCOPY (EGD) WITH PROPOFOL N/A 09/30/2018   Procedure: ESOPHAGOGASTRODUODENOSCOPY (EGD) WITH PROPOFOL;  Surgeon: Pasty Spillers, MD;  Location: [facility] ENDOSCOPY;  Service: Endoscopy;  Laterality: N/A;   ESOPHAGOGASTRODUODENOSCOPY (EGD) WITH PROPOFOL N/A 02/03/2022   Procedure: ESOPHAGOGASTRODUODENOSCOPY (EGD) WITH PROPOFOL;  Surgeon: Wyline Mood, MD;  Location: [facility] ENDOSCOPY;  Service: Gastroenterology;  Laterality: N/A;   SIGMOIDOSCOPY  05/26/10     FAMILY HISTORY   Family History   Problem Relation Age of Onset   Fibromyalgia Mother    Migraines Mother    Cancer Mother 84       breast   Breast cancer Mother    Hypertension Father    Hyperlipidemia Father    Prostate cancer Father    Ehlers-Danlos syndrome Sister    Fibromyalgia Sister    Thyroid cancer Sister    Diabetes Maternal Grandmother    Arthritis Maternal Grandmother    Kidney disease Maternal Grandmother    Heart attack Maternal Grandfather    Heart disease Maternal Grandfather    Hypertension Maternal Grandfather    Mental illness Maternal Grandfather    Cancer Paternal Grandmother        Liver cancer/lung cancer   Diabetes Paternal Grandmother    Heart attack Paternal Grandfather    Cancer Paternal Grandfather        Pancreatic/ Bladder Cancer/prostate   Arthritis Paternal Grandfather    Heart disease Paternal Grandfather    Ovarian cancer Neg Hx    Colon cancer Neg Hx      SOCIAL HISTORY   Social History   Tobacco Use   Smoking status: Never    Passive exposure: Past   Smokeless tobacco: Never  Vaping Use   Vaping status: Never Used  Substance Use Topics   Alcohol use: Yes    Alcohol/week: 0.0 standard drinks of alcohol    Comment: occasionally   Drug use: No     MEDICATIONS    Home Medication:  Current Outpatient Rx   Order #: [id number] Class: Normal   Order #: [id number] Class: Normal   Order #: [id number] Class: Normal   Order #: [id number] Class: Historical Med   Order #: [id number] Class: Normal   Order #: [id number] Class: Normal   Order #: [id number] Class: Historical Med   Order #: [id number] Class: Normal   Order #: [id number] Class: Normal   Order #: [id number] Class: Historical Med   Order #: [id number] Class: Historical Med   Order #: [id number] Class: Normal   Order #: [id number] Class: Normal   Order #: [id number] Class: Historical Med   Order #: [id number] Class: Normal   Order #: [id number] Class: Historical Med   Order #: [id number] Class: Normal   Order #: [id number] Class:  Normal   Order #: [id number] Class: Normal   Order #: [id number] Class: Historical Med   Order #: [id number] Class: Normal   Order #: [id number] Class: Normal   Order #: [id number] Class: Normal   Order #: [id number] Class: Normal   Order #: [id number] Class: Normal   Order #: [id number] Class: Normal   Order #: [id number] Class: Normal   Order #: [id number] Class: Normal   Order #: [id number] Class: Normal   Order #: [id number] Class: Normal    Current Medication:  Current Outpatient Medications:    albuterol (VENTOLIN HFA) 108 (90 Base) MCG/ACT inhaler, Inhale 2 puffs into the lungs every 4 (four) hours as needed for wheezing or shortness of breath., Disp: 18 each, Rfl: 0   amitriptyline (ELAVIL) 50 MG tablet, TAKE ONE TABLET (50 MG) BY MOUTH AT BEDTIME, Disp: 90 tablet, Rfl: 4   Atogepant (QULIPTA) 60 MG TABS, Take 60  mg by mouth daily. (Patient not taking: Reported on 09/17/2022), Disp: 30 tablet, Rfl: 11   Azelastine-Fluticasone 137-50 MCG/ACT SUSP, Place 1 spray into both nostrils 2 (two) times daily., Disp: , Rfl:    budesonide-formoterol (SYMBICORT) 160-4.5 MCG/ACT inhaler, Inhale 2 puffs into the lungs 2 (two) times daily., Disp: 1 each, Rfl: 12   Celecoxib (ELYXYB) 120 MG/4.8ML SOLN, Take 120 mg by mouth daily as needed., Disp: 28.8 mL, Rfl: 11   Cholecalciferol (VITAMIN D PO), Take 5,000 Int'l Units by mouth once a week., Disp: , Rfl:    Clindamycin-Benzoyl Per, Refr, (DUAC) gel, Apply to affected areas once daily, Disp: 45 g, Rfl: 2   cyclobenzaprine (FLEXERIL) 10 MG tablet, TAKE 1 TABLET(10 MG) BY MOUTH AT BEDTIME, Disp: 90 tablet, Rfl: 0   diclofenac (CATAFLAM) 50 MG tablet, Take 50 mg by mouth daily., Disp: , Rfl:    diclofenac sodium (VOLTAREN) 1 % GEL, Apply 4 g topically 3 (three) times daily as needed., Disp: , Rfl:    divalproex (DEPAKOTE) 500 MG DR tablet, TAKE ONE TABLET (500 MG) BY MOUTH EVERY DAY, Disp: 90 tablet, Rfl: 4   DULoxetine (CYMBALTA) 30 MG capsule, TAKE 3 CAPSULES BY MOUTH  DAILY, Disp: 270 capsule, Rfl: 1   EPINEPHrine 0.3 mg/0.3 mL IJ SOAJ injection, Inject into the muscle., Disp: , Rfl:    fluticasone (FLONASE) 50 MCG/ACT nasal spray, Place 2 sprays into both nostrils daily., Disp: 16 g, Rfl: 6   gentamicin cream (GARAMYCIN) 0.1 %, Apply 1 application topically as needed (for nose)., Disp: , Rfl:    ketoconazole (NIZORAL) 2 % cream, Apply to crease of nose twice daily as needed for flake and itch., Disp: 60 g, Rfl: 1   loratadine (CLARITIN) 10 MG tablet, Take 1 tablet (10 mg total) by mouth daily., Disp: 90 tablet, Rfl: 3   medroxyPROGESTERone (DEPO-PROVERA) 150 MG/ML injection, Inject 1 mL (150 mg total) into the muscle every 3 (three) months. (Patient not taking: Reported on 09/17/2022), Disp: 1 mL, Rfl: 4   methylPREDNISolone Acetate (DEPO-MEDROL IJ), Inject as directed every 3 (three) months. First injection June 2023, Disp: , Rfl:    omeprazole (PRILOSEC) 20 MG capsule, TAKE 1 CAPSULE(20 MG) BY MOUTH DAILY, Disp: 90 capsule, Rfl: 1   ondansetron (ZOFRAN) 4 MG tablet, Take 1 tablet (4 mg total) by mouth every 6 (six) hours., Disp: 30 tablet, Rfl: 0   ondansetron (ZOFRAN-ODT) 4 MG disintegrating tablet, Take 1 tablet (4 mg total) by mouth every 8 (eight) hours as needed for nausea., Disp: 30 tablet, Rfl: 12   Semaglutide-Weight Management (WEGOVY) 0.25 MG/0.5ML SOAJ, Inject 0.25 mg into the skin once a week., Disp: 2 mL, Rfl: 0   Semaglutide-Weight Management (WEGOVY) 0.5 MG/0.5ML SOAJ, Inject 0.5 mg into the skin once a week for 28 days., Disp: 2 mL, Rfl: 0   Semaglutide-Weight Management 2.4 MG/0.75ML SOAJ, Inject 2.4 mg into the skin once a week., Disp: 3 mL, Rfl: 2   SUMAtriptan (IMITREX) 100 MG tablet, Take 1 tablet (100 mg total) by mouth once as needed for up to 1 dose. May repeat in 2 hours if headache persists or recurs., Disp: 10 tablet, Rfl: 12   Ubrogepant (UBRELVY) 100 MG TABS, Take 100 mg by mouth every 2 (two) hours as needed. Maximum 200mg  a day.,  Disp: 16 tablet, Rfl: 11   valACYclovir (VALTREX) 500 MG tablet, TAKE 1 TABLET(500 MG) BY MOUTH DAILY, Disp: 30 tablet, Rfl: 3   zolpidem (AMBIEN CR) 6.25 MG  CR tablet, Take 1 tablet (6.25 mg total) by mouth at bedtime as needed for sleep., Disp: 30 tablet, Rfl: 2  Current Facility-Administered Medications:    medroxyPROGESTERone (DEPO-PROVERA) injection 150 mg, 150 mg, Intramuscular, Q90 days, Federico Flake, MD, 150 mg at 12/15/21 1621   medroxyPROGESTERone (DEPO-PROVERA) injection 150 mg, 150 mg, Intramuscular, Q90 days, Anyanwu, Ugonna A, MD, 150 mg at 03/10/22 1605   medroxyPROGESTERone Acetate SUSY 150 mg, 150 mg, Intramuscular, Q90 days, [place] Bing, MD, 150 mg at 08/11/22 1413    ALLERGIES   Coffee bean extract  [coffea arabica], Other, and Trazodone and nefazodone   BP 128/72 (BP Location: Left Arm, Cuff Size: Normal)   Pulse 95   Temp 98.4 F (36.9 C) (Oral)   Ht 5\' 4"  (1.626 m)   Wt 233 lb 6.4 oz (105.9 kg)   SpO2 98%   BMI 40.06 kg/m     Review of Systems: Gen:  Denies  fever, sweats, chills weight loss  HEENT: Denies blurred vision, double vision, ear pain, eye pain, hearing loss, nose bleeds, sore throat Cardiac:  No dizziness, chest pain or heaviness, chest tightness,edema, No JVD Resp:   +cough, -sputum production, -shortness of breath,-wheezing, -hemoptysis,  Other:  All other systems negative   Physical Examination:   General Appearance: No distress  EYES PERRLA, EOM intact.   NECK Supple, No JVD Pulmonary: normal breath sounds, No wheezing.  CardiovascularNormal S1,S2.  No m/r/g.   Abdomen: Benign, Soft, non-tender. Neurology UE/LE 5/5 strength, no focal deficits Ext pulses intact, cap refill intact ALL OTHER ROS ARE NEGATIVE      ASSESSMENT/PLAN   42 year old pleasant white female with obesity seen today for chronic cough for the past 4 years  At this time home sleep study , pulmonary function testing , chest x-ray , CT of the  chest does not show any underlying abnormalities   Cough unknown etiology May consider uncontrolled reflux along with cough variant asthma as etiology Continue albuterol as needed  Avoid secondhand smoke Avoid SICK contacts Recommend  Masking  when appropriate Recommend Keep up-to-date with vaccinations   GERD follow-up with GI continue PPI BID  Obesity -recommend significant weight loss -recommend changing diet  Deconditioned state -Recommend increased daily activity and exercise  ENT Follow-up as scheduled     MEDICATION ADJUSTMENTS/LABS AND TESTS ORDERED: Avoid secondhand smoke Avoid SICK contacts Recommend  Masking  when appropriate Recommend Keep up-to-date with vaccinations Continue to use albuterol inhaler as needed  Recommend weight loss   CURRENT MEDICATIONS REVIEWED AT LENGTH WITH PATIENT TODAY   Patient  satisfied with Plan of action and management. All questions answered  Follow up 1 year  Total Time Spent  24 mins   Alaisha Eversley Santiago Glad, M.D.  [facility]  Medical Director [facility] Medical Director [facility] Cardio-Pulmonary Department

## 2022-10-16 NOTE — Telephone Encounter (Signed)
Pharmacy Patient Advocate Encounter  Received notification from [facility] that Prior Authorization for Ubrelvy 100MG  tablets has been APPROVED from 10-14-2022 to 08-28-20205. Ran test claim, Copay is $0.00 Quantity approved 16 tablets per 30 days. This test claim was processed through [facility]- copay amounts may vary at other pharmacies due to pharmacy/plan contracts, or as the patient moves through the different stages of their insurance plan.   PA #/Case ID/Reference #: [id number]

## 2022-10-16 NOTE — Patient Instructions (Signed)
Avoid secondhand smoke Avoid triggers Avoid SICK contacts Recommend  Masking  when appropriate Recommend Keep up-to-date with vaccinations  No need for Symbicort Great job on weight loss!

## 2022-10-21 ENCOUNTER — Ambulatory Visit: Payer: Managed Care, Other (non HMO) | Admitting: Dermatology

## 2022-10-21 ENCOUNTER — Encounter: Payer: Self-pay | Admitting: Dermatology

## 2022-10-21 DIAGNOSIS — L814 Other melanin hyperpigmentation: Secondary | ICD-10-CM | POA: Diagnosis not present

## 2022-10-21 DIAGNOSIS — L578 Other skin changes due to chronic exposure to nonionizing radiation: Secondary | ICD-10-CM | POA: Diagnosis not present

## 2022-10-21 DIAGNOSIS — L219 Seborrheic dermatitis, unspecified: Secondary | ICD-10-CM

## 2022-10-21 DIAGNOSIS — L821 Other seborrheic keratosis: Secondary | ICD-10-CM

## 2022-10-21 DIAGNOSIS — B360 Pityriasis versicolor: Secondary | ICD-10-CM

## 2022-10-21 DIAGNOSIS — D229 Melanocytic nevi, unspecified: Secondary | ICD-10-CM

## 2022-10-21 DIAGNOSIS — D1801 Hemangioma of skin and subcutaneous tissue: Secondary | ICD-10-CM

## 2022-10-21 MED ORDER — KETOCONAZOLE 2 % EX SHAM: MEDICATED_SHAMPOO | CUTANEOUS | 6 refills | Status: DC

## 2022-10-21 NOTE — Progress Notes (Signed)
Follow-Up Visit   Subjective  Margaret Silva is a 42 y.o. female who presents for the following: Skin Cancer Screening and Full Body Skin Exam. Patient c/o ingrown toenails.   The patient presents for Total-Body Skin Exam (TBSE) for skin cancer screening and mole check. The patient has spots, moles and lesions to be evaluated, some may be new or changing and the patient may have concern these could be cancer.    The following portions of the chart were reviewed this encounter and updated as appropriate: medications, allergies, medical history  Review of Systems:  No other skin or systemic complaints except as noted in HPI or Assessment and Plan.  Objective  Well appearing patient in no apparent distress; mood and affect are within normal limits.  A full examination was performed including scalp, head, eyes, ears, nose, lips, neck, chest, axillae, abdomen, back, buttocks, bilateral upper extremities, bilateral lower extremities, hands, feet, fingers, toes, fingernails, and toenails. All findings within normal limits unless otherwise noted below.   Relevant physical exam findings are noted in the Assessment and Plan.    Assessment & Plan   SKIN CANCER SCREENING PERFORMED TODAY.  ACTINIC DAMAGE - Chronic condition, secondary to cumulative UV/sun exposure - diffuse scaly erythematous macules with underlying dyspigmentation - Recommend daily broad spectrum sunscreen SPF 30+ to sun-exposed areas, reapply every 2 hours as needed.  - Staying in the shade or wearing long sleeves, sun glasses (UVA+UVB protection) and wide brim hats (4-inch brim around the entire circumference of the hat) are also recommended for sun protection.  - Call for new or changing lesions.  LENTIGINES, SEBORRHEIC KERATOSES, HEMANGIOMAS - Benign normal skin lesions - Benign-appearing - Call for any changes  MELANOCYTIC NEVI - Tan-brown and/or pink-flesh-colored symmetric macules and papules - Benign  appearing on exam today - Observation - Call clinic for new or changing moles - Recommend daily use of broad spectrum spf 30+ sunscreen to sun-exposed areas.   Seborrheic dermatitis face   Chronic condition with duration or expected duration over one year. Currently well-controlled.   Seborrheic Dermatitis  -  is a chronic persistent rash characterized by pinkness and scaling most commonly of the mid face but also can occur on the scalp (dandruff), ears; mid chest, mid back and groin.  It tends to be exacerbated by stress and cooler weather.  People who have neurologic disease may experience new onset or exacerbation of existing seborrheic dermatitis.  The condition is not curable but treatable and can be controlled.   Well controlled on ketoconazole 2% cream   Multiple benign nevi  Lentigines  Seborrheic keratoses  Cherry angioma  Actinic elastosis  Seborrheic dermatitis  Related Medications ketoconazole (NIZORAL) 2 % cream Apply to crease of nose twice daily as needed for flake and itch.  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 Condition is symptomatic/ bothersome to patient. Not currently at goal.   Apply Ketoconazole cream twice a day as needed until clear.  Start  ketoconazole shampoo as body wash once a day in the shower until resolved.   Tinea versicolor is a chronic recurrent skin rash causing discolored scaly spots most commonly seen on back, chest, and/or shoulders.  It is generally asymptomatic. The rash is due to overgrowth of a common type of yeast present on everyone's skin and it is not contagious.  It tends to flare more in the summer due to increased sweating on trunk.  After rash is treated, the scaliness will resolve, but the discoloration will take longer to return to normal pigmentation. The periodic use of an OTC medicated soap/shampoo with zinc or selenium sulfide can be  helpful to prevent yeast overgrowth and recurrence.     Return in about 1 year (around 10/21/2023) for TBSE, Seborrheic dermatitis .  IAngelique Holm, CMA, am acting as scribe for Elie Goody, MD .   Documentation: I have reviewed the above documentation for accuracy and completeness, and I agree with the above.  Elie Goody, MD

## 2022-10-21 NOTE — Patient Instructions (Signed)

## 2022-10-26 ENCOUNTER — Other Ambulatory Visit: Payer: Self-pay | Admitting: Neurology

## 2022-11-03 ENCOUNTER — Other Ambulatory Visit: Payer: Self-pay

## 2022-11-03 ENCOUNTER — Ambulatory Visit: Payer: Managed Care, Other (non HMO) | Admitting: Obstetrics and Gynecology

## 2022-11-03 ENCOUNTER — Telehealth: Payer: Self-pay

## 2022-11-03 ENCOUNTER — Encounter: Payer: Self-pay | Admitting: Obstetrics and Gynecology

## 2022-11-03 ENCOUNTER — Ambulatory Visit: Payer: Managed Care, Other (non HMO)

## 2022-11-03 VITALS — BP 120/85 | HR 94 | Wt 230.0 lb

## 2022-11-03 DIAGNOSIS — Z01419 Encounter for gynecological examination (general) (routine) without abnormal findings: Secondary | ICD-10-CM | POA: Diagnosis not present

## 2022-11-03 DIAGNOSIS — Z1231 Encounter for screening mammogram for malignant neoplasm of breast: Secondary | ICD-10-CM | POA: Diagnosis not present

## 2022-11-03 DIAGNOSIS — Z3042 Encounter for surveillance of injectable contraceptive: Secondary | ICD-10-CM | POA: Diagnosis not present

## 2022-11-03 MED ORDER — MEDROXYPROGESTERONE ACETATE 150 MG/ML IM SUSP
150.0000 mg | INTRAMUSCULAR | 4 refills | Status: DC
Start: 2022-11-03 — End: 2023-12-08
  Filled 2022-11-03: qty 1, 90d supply, fill #0
  Filled 2023-07-06: qty 1, 90d supply, fill #1
  Filled 2023-09-21: qty 1, 90d supply, fill #2

## 2022-11-03 MED ORDER — MEDROXYPROGESTERONE ACETATE 150 MG/ML IM SUSY
PREFILLED_SYRINGE | Freq: Once | INTRAMUSCULAR | Status: AC
Start: 2022-11-03 — End: 2022-11-03

## 2022-11-03 NOTE — Telephone Encounter (Signed)
Left message for pt to call office back regarding today's appt

## 2022-11-03 NOTE — Progress Notes (Signed)
ANNUAL EXAM Patient name: Margaret Silva MRN [id number]  Date of birth: [birth year] Chief Complaint:   Gynecologic Exam  History of Present Illness:   Margaret Silva is a 42 y.o. G0P0000 female being seen today for a routine annual exam.   Current concerns: None  Current birth control: Depo  No LMP recorded. Patient has had an injection.   Last MXR: 2018 Last Pap/Pap History:  07/2021 - pap/hpv wnl  Review of Systems:   Pertinent items are noted in HPI Denies any headaches, blurred vision, fatigue, shortness of breath, chest pain, abdominal pain, abnormal vaginal discharge/itching/odor/irritation, problems with periods, bowel movements, urination, or intercourse unless otherwise stated above.  Pertinent History Reviewed:  Reviewed past medical,surgical, social and family history.  Reviewed problem list, medications and allergies. Physical Assessment:   Vitals:   11/03/22 1521  BP: 120/85  Pulse: 94  Weight: 230 lb (104.3 kg)  Body mass index is 39.48 kg/m.   Physical Examination:  General appearance - well appearing, and in no distress Mental status - alert, oriented to person, place, and time Psych:  She has a normal mood and affect Skin - warm and dry, normal color, no suspicious lesions noted Chest - effort normal Heart - normal rate  Breasts - breasts appear normal, no suspicious masses, no skin or nipple changes or axillary nodes Abdomen - soft, nontender, nondistended, no masses or organomegaly Pelvic -  VULVA: normal appearing vulva with no masses, tenderness or lesions  VAGINA: normal appearing vagina with normal color and discharge, no lesions  CERVIX: normal appearing cervix without discharge or lesions, no CMT UTERUS: uterus is felt to be normal size, shape, consistency and nontender  ADNEXA: No adnexal masses or tenderness noted. Extremities:  No swelling or varicosities noted  Chaperone present for exam  No results found for this or any  previous visit (from the past 24 hour(s)).  Assessment & Plan:  Ardel was seen today for gynecologic exam.  Diagnoses and all orders for this visit:  Encounter for annual routine gynecological examination - Cervical cancer screening: Discussed guidelines. Pap with HPV done - STD Testing: declines - Birth Control: Depo - Breast Health: Encouraged self breast awareness/SBE. Teaching provided. Discussed limits of clinical breast exam for detecting breast cancer. Rx given for MXR - F/U 12 months and prn  Breast cancer screening by mammogram -     MM 3D SCREENING MAMMOGRAM BILATERAL BREAST; Future       Orders Placed This Encounter  Procedures   MM 3D SCREENING MAMMOGRAM BILATERAL BREAST    Meds:  Meds ordered this encounter  Medications   medroxyPROGESTERone Acetate SUSY    Follow-up: Return in about 1 year (around 11/03/2023) for annual.  Milas Hock, MD 11/03/2022 3:42 PM

## 2022-11-03 NOTE — Progress Notes (Signed)
Patient presents for Annual. [employer]  LMP: None with Depo Last pap:  06/18/21  Contraception: Depo pt did not bring depo rx will use office stock today.pt made aware to call pharmacy for next refill has refills on file.pt voiced understanding.  Mammogram:  06/22/2016  Family Hx of Breast Cancer. STD Screening: Declines Not active  Flu Vaccine : Declines  CC: Annual/None

## 2022-11-04 ENCOUNTER — Other Ambulatory Visit: Payer: Self-pay | Admitting: Family Medicine

## 2022-11-04 ENCOUNTER — Encounter: Payer: Self-pay | Admitting: Family Medicine

## 2022-11-04 ENCOUNTER — Other Ambulatory Visit: Payer: Self-pay | Admitting: Neurology

## 2022-11-04 DIAGNOSIS — J309 Allergic rhinitis, unspecified: Secondary | ICD-10-CM

## 2022-11-04 DIAGNOSIS — G43009 Migraine without aura, not intractable, without status migrainosus: Secondary | ICD-10-CM

## 2022-11-10 ENCOUNTER — Other Ambulatory Visit: Payer: Self-pay

## 2022-11-18 NOTE — Progress Notes (Signed)
Office Visit Note  Patient: Margaret Silva             Date of Birth: [birth year]           MRN: [id number]             PCP: Glori Luis, MD Referring: Glori Luis, MD Visit Date: 12/01/2022 Occupation: @GUAROCC @  Subjective:  Neck pain  History of Present Illness: Margaret Silva is a 42 y.o. female with osteoarthritis and fibromyalgia syndrome.  She returns today after her last visit in October 2023.  She states she injured her lower back in December 2023.  She has been going to a chiropractor since then.  She states the x-rays were normal.  She states that the pain was so severe that she could not walk much.  Patient states that she gained weight as she was moderately active.  The lower back pain is gradually getting better.  She also has recurrent left trochanteric bursitis for which she gets infrared therapy through the chiropractor.  She is having a flare of bilateral trapezius region.  She wants to have cortisone injections.  She states her migraines have improved with the new medication regimen.    Activities of Daily Living:  Patient reports morning stiffness for 2-3 minutes.   Patient Denies nocturnal pain.  Difficulty dressing/grooming: Denies Difficulty climbing stairs: Denies Difficulty getting out of chair: Denies Difficulty using hands for taps, buttons, cutlery, and/or writing: Denies  Review of Systems  Constitutional:  Positive for fatigue.  HENT:  Positive for mouth dryness. Negative for mouth sores.   Eyes:  Positive for dryness.  Respiratory:  Negative for shortness of breath.   Cardiovascular:  Negative for chest pain and palpitations.  Gastrointestinal:  Positive for constipation. Negative for blood in stool and diarrhea.  Endocrine: Negative for increased urination.  Genitourinary:  Negative for involuntary urination.  Musculoskeletal:  Positive for joint pain, gait problem, joint pain, joint swelling, myalgias, muscle weakness,  morning stiffness, muscle tenderness and myalgias.  Skin:  Negative for color change, rash, hair loss and sensitivity to sunlight.  Allergic/Immunologic: Positive for susceptible to infections.  Neurological:  Positive for light-headedness and headaches. Negative for dizziness.  Hematological:  Negative for swollen glands.  Psychiatric/Behavioral:  Negative for depressed mood and sleep disturbance. The patient is not nervous/anxious.     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   Chronic insomnia 08/08/2014   Allergic rhinitis with postnasal drip 01/26/2014   POTS (postural orthostatic tachycardia syndrome) 12/03/2010   Endometriosis 12/03/2010   Fibromyalgia 12/03/2010   Chronic interstitial cystitis without hematuria 12/03/2010   IBS (irritable bowel syndrome) 12/03/2010   Migraine with aura and without status migrainosus, not intractable 12/03/2010    Past Medical History:  Diagnosis Date   Asthma    cough variant   Bursitis of left hip    Diverticulitis    h/o  Dysplastic nevus 09/03/2021   right lateral knee, moderate   Endometriosis    Fibromyalgia    GERD (gastroesophageal reflux disease)    Headache    History of chicken pox    History of colonoscopy 11/04 and April 06   History of laparoscopy 12/04 and 12/07   History of laparoscopy 01/29/2006   Dr. Toya Smothers Endometriosis   History of  trichomoniasis 09/01/2018   Hx of migraines    Hx: UTI (urinary tract infection)    Interstitial cystitis 01/03/2004   hydrodistilation   Jaundice    h/o   Plantar fasciitis, bilateral    PONV (postoperative nausea and vomiting)    Pott's disease    Not official but working on getting the diagnosis    Family History  Problem Relation Age of Onset   Fibromyalgia Mother    Migraines Mother    Cancer Mother 56       breast   Breast cancer Mother    Hypertension Father    Hyperlipidemia Father    Prostate cancer Father    Ehlers-Danlos syndrome Sister    Fibromyalgia Sister    Thyroid cancer Sister    Diabetes Maternal Grandmother    Arthritis Maternal Grandmother    Kidney disease Maternal Grandmother    Heart attack Maternal Grandfather    Heart disease Maternal Grandfather    Hypertension Maternal Grandfather    Mental illness Maternal Grandfather    Cancer Paternal Grandmother        Liver cancer/lung cancer   Diabetes Paternal Grandmother    Heart attack Paternal Grandfather    Cancer Paternal Grandfather        Pancreatic/ Bladder Cancer/prostate   Arthritis Paternal Grandfather    Heart disease Paternal Grandfather    Ovarian cancer Neg Hx    Colon cancer Neg Hx    Past Surgical History:  Procedure Laterality Date   CYSTOSCOPY WITH HYDRODISTENSION AND BIOPSY     DIAGNOSTIC LAPAROSCOPY  02/16/2003   ESOPHAGOGASTRODUODENOSCOPY (EGD) WITH PROPOFOL N/A 09/30/2018   Procedure: ESOPHAGOGASTRODUODENOSCOPY (EGD) WITH PROPOFOL;  Surgeon: Pasty Spillers, MD;  Location: [facility] ENDOSCOPY;  Service: Endoscopy;  Laterality: N/A;   ESOPHAGOGASTRODUODENOSCOPY (EGD) WITH PROPOFOL N/A 02/03/2022   Procedure: ESOPHAGOGASTRODUODENOSCOPY (EGD) WITH PROPOFOL;  Surgeon: Wyline Mood, MD;  Location: [facility] ENDOSCOPY;  Service: Gastroenterology;  Laterality: N/A;   SIGMOIDOSCOPY  05/26/10   Social History   Social History Narrative   Lives with dog   Caffeine use:  coffee on occasion  maybe once a week   Right handed   Immunization History  Administered Date(s) Administered   Tdap 10/02/2020     Objective: Vital Signs: BP 123/79 (BP Location: Left Arm, Patient Position: Sitting, Cuff Size: Large)   Pulse 97   Resp 16   Ht 5\' 4"  (1.626 m)   Wt 228 lb (103.4 kg)   BMI 39.14 kg/m    Physical Exam Vitals and nursing note reviewed.  Constitutional:      Appearance: She is well-developed.  HENT:     Head: Normocephalic and atraumatic.  Eyes:     Conjunctiva/sclera: Conjunctivae normal.  Cardiovascular:     Rate and Rhythm: Normal rate and regular rhythm.     Heart sounds: Normal heart sounds.  Pulmonary:     Effort: Pulmonary effort is normal.     Breath sounds: Normal breath sounds.  Abdominal:     General: Bowel sounds are normal.     Palpations: Abdomen is soft.  Musculoskeletal:  Cervical back: Normal range of motion.  Lymphadenopathy:     Cervical: No cervical adenopathy.  Skin:    General: Skin is warm and dry.     Capillary Refill: Capillary refill takes less than 2 seconds.  Neurological:     Mental Status: She is alert and oriented to person, place, and time.  Psychiatric:        Behavior: Behavior normal.      Musculoskeletal Exam: She had good range of motion of the cervical spine with discomfort.  Bilateral trapezius spasm was noted.  She had discomfort range of motion of the lumbar spine.  Shoulder joints, elbow joints wrist joints, MCPs PIPs and DIPs with good range of motion with no synovitis.  Hip joints and knee joints were in good range of motion.  She had tenderness over left trochanteric bursa.  There was no tenderness over ankles or MTPs.  CDAI Exam: CDAI Score: -- Patient Global: --; Provider Global: -- Swollen: --; Tender: -- Joint Exam 12/01/2022   No joint exam has been documented for this visit   There is currently no information documented on the homunculus. Go to the Rheumatology activity and complete the  homunculus joint exam.  Investigation: No additional findings.  Imaging: No results found.  Recent Labs: Lab Results  Component Value Date   WBC 11.1 (H) 07/17/2021   HGB 15.1 (H) 07/17/2021   PLT 378 07/17/2021   NA 136 07/17/2021   K 4.3 07/17/2021   CL 105 07/17/2021   CO2 23 07/17/2021   GLUCOSE 106 (H) 07/17/2021   BUN 13 07/17/2021   CREATININE 0.86 07/17/2021   BILITOT 0.7 07/17/2021   ALKPHOS 73 07/17/2021   AST 14 (L) 07/17/2021   ALT 17 07/17/2021   PROT 7.8 07/17/2021   ALBUMIN 4.2 07/17/2021   CALCIUM 9.8 07/17/2021   GFRAA 85 11/27/2019    Speciality Comments: No specialty comments available.  Procedures:  Trigger Point Inj  Date/Time: 12/01/2022 4:11 PM  Performed by: Pollyann Savoy, MD Authorized by: Pollyann Savoy, MD   Consent Given by:  Patient Site marked: the procedure site was marked   Timeout: prior to procedure the correct patient, procedure, and site was verified   Indications:  Muscle spasm and pain Total # of Trigger Points:  2 Location: neck   Needle Size:  27 G Approach:  Dorsal Medications #1:  0.5 mL lidocaine 1 %; 20 mg triamcinolone acetonide 40 MG/ML Medications #2:  20 mg triamcinolone acetonide 40 MG/ML; 0.5 mL lidocaine 1 % Patient tolerance:  Patient tolerated the procedure well with no immediate complications  Allergies: Coffee bean extract  [coffea arabica], Other, and Trazodone and nefazodone   Assessment / Plan:     Visit Diagnoses: Trapezius muscle spasm-she has been experiencing pain and discomfort over bilateral trapezius region.  Patient requested bilateral trigger point injections.  After informed consent was obtained bilateral trapezius region was injected with lidocaine and Kenalog.  Patient tolerated the procedure well.  Postprocedure instructions were given.  A handout on neck exercises was given.  Chronic midline lower back pain without sciatica-patient injured her back in December 2023.  She has been  going to the chiropractor.  She states her symptoms are gradually improving.  She could not exercise for a while and gained weight.  Fibromyalgia-she continues to have some generalized pain and discomfort from fibromyalgia.  Need for regular exercise was emphasized.  Chronic insomnia -she remains on Ambien 6.25 mg at bedtime for insomnia.  Other  fatigue-she continues to have some fatigue from fibromyalgia and insomnia.  Trochanteric bursitis of left hip-she had a sore the left trochanteric region.  Patient states she has been getting in for Ro therapy which has been helpful.  Primary osteoarthritis of both knees-she is not in discomfort in her knee joints.  No warmth swelling or effusion was noted.  Other medical problems are listed as follows:  Restless leg syndrome  History of cystitis (IC)  Other irritable bowel syndrome  History of anxiety  History of migraine   POTS (postural orthostatic tachycardia syndrome)  Orders: Orders Placed This Encounter  Procedures   Trigger Point Inj   No orders of the defined types were placed in this encounter.    Follow-Up Instructions: Return if symptoms worsen or fail to improve, for OA.   Pollyann Savoy, MD  Note - This record has been created using Animal nutritionist.  Chart creation errors have been sought, but may not always  have been located. Such creation errors do not reflect on  the standard of medical care.

## 2022-11-22 ENCOUNTER — Other Ambulatory Visit: Payer: Self-pay | Admitting: Neurology

## 2022-11-22 DIAGNOSIS — G43009 Migraine without aura, not intractable, without status migrainosus: Secondary | ICD-10-CM

## 2022-11-23 ENCOUNTER — Other Ambulatory Visit: Payer: Self-pay | Admitting: Family Medicine

## 2022-11-23 DIAGNOSIS — F419 Anxiety disorder, unspecified: Secondary | ICD-10-CM

## 2022-11-24 ENCOUNTER — Other Ambulatory Visit: Payer: Self-pay | Admitting: Neurology

## 2022-11-24 DIAGNOSIS — G43009 Migraine without aura, not intractable, without status migrainosus: Secondary | ICD-10-CM

## 2022-11-24 NOTE — Telephone Encounter (Signed)
Per Dr Lucia Gaskins, ok to give 6 month refill but patient must be seen for further refills. Per Dr Lucia Gaskins, set her up with NP.

## 2022-11-25 ENCOUNTER — Other Ambulatory Visit: Payer: Self-pay | Admitting: Dermatology

## 2022-11-25 DIAGNOSIS — L700 Acne vulgaris: Secondary | ICD-10-CM

## 2022-12-01 ENCOUNTER — Ambulatory Visit: Payer: Managed Care, Other (non HMO) | Attending: Rheumatology | Admitting: Rheumatology

## 2022-12-01 ENCOUNTER — Encounter: Payer: Self-pay | Admitting: Rheumatology

## 2022-12-01 VITALS — BP 123/79 | HR 97 | Resp 16 | Ht 64.0 in | Wt 228.0 lb

## 2022-12-01 DIAGNOSIS — Z87440 Personal history of urinary (tract) infections: Secondary | ICD-10-CM

## 2022-12-01 DIAGNOSIS — F5104 Psychophysiologic insomnia: Secondary | ICD-10-CM | POA: Diagnosis not present

## 2022-12-01 DIAGNOSIS — M7062 Trochanteric bursitis, left hip: Secondary | ICD-10-CM

## 2022-12-01 DIAGNOSIS — Z8669 Personal history of other diseases of the nervous system and sense organs: Secondary | ICD-10-CM

## 2022-12-01 DIAGNOSIS — G2581 Restless legs syndrome: Secondary | ICD-10-CM

## 2022-12-01 DIAGNOSIS — M797 Fibromyalgia: Secondary | ICD-10-CM

## 2022-12-01 DIAGNOSIS — R5383 Other fatigue: Secondary | ICD-10-CM

## 2022-12-01 DIAGNOSIS — M62838 Other muscle spasm: Secondary | ICD-10-CM

## 2022-12-01 DIAGNOSIS — G8929 Other chronic pain: Secondary | ICD-10-CM

## 2022-12-01 DIAGNOSIS — M170 Bilateral primary osteoarthritis of knee: Secondary | ICD-10-CM

## 2022-12-01 DIAGNOSIS — G90A Postural orthostatic tachycardia syndrome (POTS): Secondary | ICD-10-CM

## 2022-12-01 DIAGNOSIS — Z8659 Personal history of other mental and behavioral disorders: Secondary | ICD-10-CM

## 2022-12-01 DIAGNOSIS — M545 Low back pain, unspecified: Secondary | ICD-10-CM

## 2022-12-01 DIAGNOSIS — K588 Other irritable bowel syndrome: Secondary | ICD-10-CM

## 2022-12-01 MED ORDER — LIDOCAINE HCL 1 % IJ SOLN
0.5000 mL | INTRAMUSCULAR | Status: AC | PRN
Start: 2022-12-01 — End: 2022-12-01
  Administered 2022-12-01: .5 mL

## 2022-12-01 MED ORDER — TRIAMCINOLONE ACETONIDE 40 MG/ML IJ SUSP
20.0000 mg | INTRAMUSCULAR | Status: AC | PRN
Start: 2022-12-01 — End: 2022-12-01
  Administered 2022-12-01: 20 mg via INTRAMUSCULAR

## 2022-12-01 NOTE — Patient Instructions (Signed)
Cervical Strain and Sprain Rehab Ask your health care provider which exercises are safe for you. Do exercises exactly as told by your health care provider and adjust them as directed. It is normal to feel mild stretching, pulling, tightness, or discomfort as you do these exercises. Stop right away if you feel sudden pain or your pain gets worse. Do not begin these exercises until told by your health care provider. Stretching and range-of-motion exercises Cervical side bending  Using good posture, sit on a stable chair or stand up. Without moving your shoulders, slowly tilt your left / right ear to your shoulder until you feel a stretch in the neck muscles on the opposite side. You should be looking straight ahead. Hold for __________ seconds. Repeat with the other side of your neck. Repeat __________ times. Complete this exercise __________ times a day. Cervical rotation  Using good posture, sit on a stable chair or stand up. Slowly turn your head to the side as if you are looking over your left / right shoulder. Keep your eyes level with the ground. Stop when you feel a stretch along the side and the back of your neck. Hold for __________ seconds. Repeat this by turning to your other side. Repeat __________ times. Complete this exercise __________ times a day. Thoracic extension and pectoral stretch  Roll a towel or a small blanket so it is about 4 inches (10 cm) in diameter. Lie down on your back on a firm surface. Put the towel in the middle of your back across your spine. It should not be under your shoulder blades. Put your hands behind your head and let your elbows fall out to your sides. Hold for __________ seconds. Repeat __________ times. Complete this exercise __________ times a day. Strengthening exercises Upper cervical flexion  Lie on your back with a thin pillow behind your head or a small, rolled-up towel under your neck. Gently tuck your chin toward your chest and nod  your head down to look toward your feet. Do not lift your head off the pillow. Hold for __________ seconds. Release the tension slowly. Relax your neck muscles completely before you repeat this exercise. Repeat __________ times. Complete this exercise __________ times a day. Cervical extension  Stand about 6 inches (15 cm) away from a wall, with your back facing the wall. Place a soft object, about 6-8 inches (15-20 cm) in diameter, between the back of your head and the wall. A soft object could be a small pillow, a ball, or a folded towel. Gently tilt your head back and press into the soft object. Keep your jaw and forehead relaxed. Hold for __________ seconds. Release the tension slowly. Relax your neck muscles completely before you repeat this exercise. Repeat __________ times. Complete this exercise __________ times a day. Posture and body mechanics Body mechanics refer to the movements and positions of your body while you do your daily activities. Posture is part of body mechanics. Good posture and healthy body mechanics can help to relieve stress in your body's tissues and joints. Good posture means that your spine is in its natural S-curve position (your spine is neutral), your shoulders are pulled back slightly, and your head is not tipped forward. The following are general guidelines for using improved posture and body mechanics in your everyday activities. Sitting  When sitting, keep your spine neutral and keep your feet flat on the floor. Use a footrest, if needed, and keep your thighs parallel to the floor. Avoid rounding  your shoulders. Avoid tilting your head forward. When working at a desk or a computer, keep your desk at a height where your hands are slightly lower than your elbows. Slide your chair under your desk so you are close enough to maintain good posture. When working at a computer, place your monitor at a height where you are looking straight ahead and you do not have to  tilt your head forward or downward to look at the screen. Standing  When standing, keep your spine neutral and keep your feet about hip-width apart. Keep a slight bend in your knees. Your ears, shoulders, and hips should line up. When you do a task in which you stand in one place for a long time, place one foot up on a stable object that is 2-4 inches (5-10 cm) high, such as a footstool. This helps keep your spine neutral. Resting When lying down and resting, avoid positions that are most painful for you. Try to support your neck in a neutral position. You can use a contour pillow or a small rolled-up towel. Your pillow should support your neck but not push on it. This information is not intended to replace advice given to you by your health care provider. Make sure you discuss any questions you have with your health care provider. Document Revised: 06/08/2022 Document Reviewed: 08/25/2021 Elsevier Patient Education  2024 [employer].

## 2022-12-08 ENCOUNTER — Other Ambulatory Visit: Payer: Self-pay

## 2022-12-08 ENCOUNTER — Other Ambulatory Visit: Payer: Self-pay | Admitting: Family Medicine

## 2022-12-08 DIAGNOSIS — F5104 Psychophysiologic insomnia: Secondary | ICD-10-CM

## 2022-12-08 MED ORDER — ZOLPIDEM TARTRATE ER 6.25 MG PO TBCR
6.2500 mg | EXTENDED_RELEASE_TABLET | Freq: Every evening | ORAL | 2 refills | Status: DC | PRN
Start: 2022-12-08 — End: 2023-03-26
  Filled 2022-12-08: qty 30, 30d supply, fill #0
  Filled 2023-01-11: qty 30, 30d supply, fill #1
  Filled 2023-02-15: qty 30, 30d supply, fill #2

## 2022-12-10 ENCOUNTER — Ambulatory Visit
Admission: RE | Admit: 2022-12-10 | Discharge: 2022-12-10 | Disposition: A | Discharge status: Home / Self Care | Payer: Managed Care, Other (non HMO) | Source: Ambulatory Visit | Attending: Obstetrics and Gynecology | Admitting: Obstetrics and Gynecology

## 2022-12-10 DIAGNOSIS — Z1231 Encounter for screening mammogram for malignant neoplasm of breast: Secondary | ICD-10-CM

## 2022-12-16 ENCOUNTER — Encounter: Payer: Self-pay | Admitting: Family Medicine

## 2022-12-16 ENCOUNTER — Ambulatory Visit (INDEPENDENT_AMBULATORY_CARE_PROVIDER_SITE_OTHER): Payer: Managed Care, Other (non HMO) | Admitting: Family Medicine

## 2022-12-16 ENCOUNTER — Other Ambulatory Visit: Payer: Self-pay

## 2022-12-16 VITALS — BP 118/72 | HR 85 | Temp 98.0°F | Ht 64.0 in | Wt 223.2 lb

## 2022-12-16 DIAGNOSIS — F5104 Psychophysiologic insomnia: Secondary | ICD-10-CM | POA: Diagnosis not present

## 2022-12-16 DIAGNOSIS — R053 Chronic cough: Secondary | ICD-10-CM

## 2022-12-16 MED ORDER — ALBUTEROL SULFATE HFA 108 (90 BASE) MCG/ACT IN AERS
2.0000 | INHALATION_SPRAY | RESPIRATORY_TRACT | 0 refills | Status: DC | PRN
  Filled 2022-12-16: qty 18, fill #0

## 2022-12-16 MED ORDER — ALBUTEROL SULFATE HFA 108 (90 BASE) MCG/ACT IN AERS: 2.0000 | INHALATION_SPRAY | RESPIRATORY_TRACT | 0 refills | Status: AC | PRN

## 2022-12-16 NOTE — Assessment & Plan Note (Signed)
Chronic.  Has seen pulmonology.  Patient with potential cough variant asthma.  We will continue to work on weight loss.  Albuterol refilled.

## 2022-12-16 NOTE — Progress Notes (Signed)
Margaret Alar, MD Phone: [phone]  Margaret Silva is a 42 y.o. female who presents today for follow-up.  Insomnia: Stable.  Gets 8 hours of sleep nightly.  Taking Ambien.  No drowsiness with this.  Obesity: Patient gets plenty of vegetables.  Not many fruits.  Still cannot taste very much so is not eating large volumes of food.  She has been able to walk more for activity.  She is on Wegovy 1.7 mg weekly.  Cough: Patient saw pulmonology.  They felt this was related to GERD or cough variant asthma.  They recommended weight loss and treatment for reflux.  They also noted she could continue to use albuterol as needed.  Social History   Tobacco Use  Smoking Status Never   Passive exposure: Past  Smokeless Tobacco Never    Current Outpatient Medications on File Prior to Visit  Medication Sig Dispense Refill   amitriptyline (ELAVIL) 50 MG tablet TAKE ONE TABLET (50 MG) BY MOUTH AT BEDTIME 90 tablet 4   Atogepant (QULIPTA) 60 MG TABS Take 60 mg by mouth daily. 30 tablet 11   Celecoxib (ELYXYB) 120 MG/4.8ML SOLN Take 120 mg by mouth daily as needed. 28.8 mL 11   Cholecalciferol (VITAMIN D PO) Take 5,000 Int'l Units by mouth once a week.     Clindamycin-Benzoyl Per, Refr, gel APPLY TOPICALLY TO THE AFFECTED AREA EVERY DAY 45 g 2   cyclobenzaprine (FLEXERIL) 10 MG tablet TAKE 1 TABLET(10 MG) BY MOUTH AT BEDTIME 90 tablet 0   diclofenac (CATAFLAM) 50 MG tablet Take 50 mg by mouth daily.     diclofenac sodium (VOLTAREN) 1 % GEL Apply 4 g topically 3 (three) times daily as needed.     divalproex (DEPAKOTE) 500 MG DR tablet TAKE 1 TABLET(500 MG) BY MOUTH EVERY DAY. NEED APPOINTMENT FOR FUTURE REFILLS 90 tablet 1   DULoxetine (CYMBALTA) 30 MG capsule TAKE 3 CAPSULES BY MOUTH EVERY DAY 270 capsule 1   EPINEPHrine 0.3 mg/0.3 mL IJ SOAJ injection Inject into the muscle.     fluticasone (FLONASE) 50 MCG/ACT nasal spray Place 2 sprays into both nostrils daily. (Patient taking differently:  Place 2 sprays into both nostrils as needed.) 16 g 6   gentamicin cream (GARAMYCIN) 0.1 % Apply 1 application topically as needed (for nose).     ketoconazole (NIZORAL) 2 % cream Apply to crease of nose twice daily as needed for flake and itch. 60 g 1   ketoconazole (NIZORAL) 2 % shampoo Apply to affected skin on the bacj once a day in the shower until resolved. 120 mL 6   loratadine (CLARITIN) 10 MG tablet TAKE 1 TABLET(10 MG) BY MOUTH DAILY 90 tablet 3   medroxyPROGESTERone (DEPO-PROVERA) 150 MG/ML injection Inject 1 mL (150 mg total) into the muscle every 3 (three) months. 1 mL 4   methylPREDNISolone Acetate (DEPO-MEDROL IJ) Inject as directed every 3 (three) months. First injection June 2023     omeprazole (PRILOSEC) 20 MG capsule TAKE 1 CAPSULE(20 MG) BY MOUTH DAILY 90 capsule 1   ondansetron (ZOFRAN) 4 MG tablet Take 1 tablet (4 mg total) by mouth every 6 (six) hours. 30 tablet 0   ondansetron (ZOFRAN-ODT) 4 MG disintegrating tablet Take 1 tablet (4 mg total) by mouth every 8 (eight) hours as needed for nausea. 30 tablet 12   Semaglutide-Weight Management (WEGOVY) 0.25 MG/0.5ML SOAJ Inject 0.25 mg into the skin once a week. 2 mL 0   Semaglutide-Weight Management 1.7 MG/0.75ML SOAJ Inject 1.7 mg  into the skin once a week for 28 days. 3 mL 0   Semaglutide-Weight Management 2.4 MG/0.75ML SOAJ Inject 2.4 mg into the skin once a week. 3 mL 2   SUMAtriptan (IMITREX) 100 MG tablet TAKE 1 TABLET(100 MG) BY MOUTH 1 TIME FOR UP TO 1 DOSE AS NEEDED. MAY REPEAT IN 2 HOURS IF HEADACHE PERSISTS OR RECURS 10 tablet 1   Ubrogepant (UBRELVY) 100 MG TABS Take 100 mg by mouth every 2 (two) hours as needed. Maximum 200mg  a day. 16 tablet 11   valACYclovir (VALTREX) 500 MG tablet TAKE 1 TABLET(500 MG) BY MOUTH DAILY 30 tablet 3   zolpidem (AMBIEN CR) 6.25 MG CR tablet Take 1 tablet (6.25 mg total) by mouth at bedtime as needed for sleep. 30 tablet 2   Current Facility-Administered Medications on File Prior to  Visit  Medication Dose Route Frequency Provider Last Rate Last Admin   medroxyPROGESTERone (DEPO-PROVERA) injection 150 mg  150 mg Intramuscular Q90 days Federico Flake, MD   150 mg at 12/15/21 1621   medroxyPROGESTERone (DEPO-PROVERA) injection 150 mg  150 mg Intramuscular Q90 days Anyanwu, Ugonna A, MD   150 mg at 03/10/22 1605   medroxyPROGESTERone Acetate SUSY 150 mg  150 mg Intramuscular Q90 days Westby Bing, MD   150 mg at 08/11/22 1413     ROS see history of present illness  Objective  Physical Exam Vitals:   12/16/22 1424  BP: 118/72  Pulse: 85  Temp: 98 F (36.7 C)  SpO2: 98%    BP Readings from Last 3 Encounters:  12/16/22 118/72  12/01/22 123/79  11/03/22 120/85   Wt Readings from Last 3 Encounters:  12/16/22 223 lb 3.2 oz (101.2 kg)  12/01/22 228 lb (103.4 kg)  11/03/22 230 lb (104.3 kg)    Physical Exam Constitutional:      General: She is not in acute distress.    Appearance: She is not diaphoretic.  Cardiovascular:     Rate and Rhythm: Normal rate and regular rhythm.     Heart sounds: Normal heart sounds.  Pulmonary:     Effort: Pulmonary effort is normal.     Breath sounds: Normal breath sounds.  Skin:    General: Skin is warm and dry.  Neurological:     Mental Status: She is alert.      Assessment/Plan: Please see individual problem list.  Chronic insomnia Assessment & Plan: Chronic issue.  Stable.  She will continue Ambien 6.25 mg nightly as needed for sleep.   Morbid obesity (HCC) Assessment & Plan: Chronic issue.  She will continue to monitor her portions.  She will continue to increase her activity level.  She will continue Wegovy 1.7 mg for a total of 4 weeks and then increase to 2.4 mg weekly.   Chronic cough Assessment & Plan: Chronic.  Has seen pulmonology.  Patient with potential cough variant asthma.  We will continue to work on weight loss.  Albuterol refilled.   Other orders -     Albuterol Sulfate HFA;  Inhale 2 puffs into the lungs every 4 (four) hours as needed for wheezing or shortness of breath.  Dispense: 18 each; Refill: 0   Return in about 3 months (around 03/18/2023) for transfer of care.   Margaret Alar, MD [facility]

## 2022-12-16 NOTE — Assessment & Plan Note (Signed)
Chronic issue.  Stable.  She will continue Ambien 6.25 mg nightly as needed for sleep.

## 2022-12-16 NOTE — Assessment & Plan Note (Signed)
Chronic issue.  She will continue to monitor her portions.  She will continue to increase her activity level.  She will continue Wegovy 1.7 mg for a total of 4 weeks and then increase to 2.4 mg weekly.

## 2023-01-08 ENCOUNTER — Other Ambulatory Visit: Payer: Self-pay | Admitting: Family Medicine

## 2023-01-08 ENCOUNTER — Other Ambulatory Visit: Payer: Self-pay

## 2023-01-11 ENCOUNTER — Other Ambulatory Visit: Payer: Self-pay | Admitting: Family Medicine

## 2023-01-11 ENCOUNTER — Other Ambulatory Visit: Payer: Self-pay | Admitting: Neurology

## 2023-01-11 ENCOUNTER — Other Ambulatory Visit: Payer: Self-pay

## 2023-01-11 DIAGNOSIS — B009 Herpesviral infection, unspecified: Secondary | ICD-10-CM

## 2023-01-11 MED FILL — Semaglutide (Weight Mngmt) Soln Auto-Injector 1.7 MG/0.75ML: SUBCUTANEOUS | 28 days supply | Qty: 3 | Fill #0 | Status: AC

## 2023-01-11 NOTE — Telephone Encounter (Signed)
Please call her and see what issue she had with the higher dose. Is she still feeling poorly? When was the last time she took the wegovy 2.4 mg dose?

## 2023-01-13 ENCOUNTER — Other Ambulatory Visit: Payer: Self-pay

## 2023-01-18 ENCOUNTER — Other Ambulatory Visit: Payer: Self-pay | Admitting: Neurology

## 2023-01-19 ENCOUNTER — Ambulatory Visit: Payer: Managed Care, Other (non HMO)

## 2023-01-19 VITALS — BP 117/84 | HR 102

## 2023-01-19 DIAGNOSIS — Z3042 Encounter for surveillance of injectable contraceptive: Secondary | ICD-10-CM

## 2023-01-19 MED ORDER — MEDROXYPROGESTERONE ACETATE 150 MG/ML IM SUSP
150.0000 mg | Freq: Once | INTRAMUSCULAR | Status: AC
  Administered 2023-01-19: 150 mg via INTRAMUSCULAR

## 2023-01-19 NOTE — Progress Notes (Cosign Needed Addendum)
Date last pap: 06/18/21. Last Depo-Provera: 11/03/22. Side Effects if any: NA. Serum HCG indicated? NA. Depo-Provera 150 mg IM given by: Chriss Driver . Next appointment due 02/03/24-04/20/23.  Pt supplied

## 2023-01-21 ENCOUNTER — Other Ambulatory Visit (HOSPITAL_COMMUNITY): Payer: Self-pay

## 2023-02-03 ENCOUNTER — Other Ambulatory Visit (HOSPITAL_COMMUNITY): Payer: Self-pay

## 2023-02-04 ENCOUNTER — Telehealth: Payer: Self-pay | Admitting: Neurology

## 2023-02-04 ENCOUNTER — Telehealth: Payer: Self-pay

## 2023-02-04 ENCOUNTER — Other Ambulatory Visit (HOSPITAL_COMMUNITY): Payer: Self-pay

## 2023-02-04 DIAGNOSIS — G43009 Migraine without aura, not intractable, without status migrainosus: Secondary | ICD-10-CM

## 2023-02-04 MED ORDER — DIVALPROEX SODIUM 500 MG PO DR TAB: DELAYED_RELEASE_TABLET | ORAL | 0 refills | Status: DC

## 2023-02-04 MED ORDER — UBRELVY 100 MG PO TABS: 100.0000 mg | ORAL_TABLET | ORAL | 2 refills | Status: DC | PRN

## 2023-02-04 MED ORDER — AMITRIPTYLINE HCL 50 MG PO TABS: ORAL_TABLET | ORAL | 0 refills | Status: DC

## 2023-02-04 MED ORDER — QULIPTA 60 MG PO TABS: 60.0000 mg | ORAL_TABLET | Freq: Every day | ORAL | 2 refills | Status: DC

## 2023-02-04 MED ORDER — CYCLOBENZAPRINE HCL 10 MG PO TABS: 10.0000 mg | ORAL_TABLET | Freq: Every day | ORAL | 0 refills | Status: DC

## 2023-02-04 NOTE — Telephone Encounter (Signed)
Pharmacy Patient Advocate Encounter  Received notification from [facility] that Prior Authorization for [facility] 1.7MG /0.75ML auto-injectors  has been APPROVED from 02/04/23 to 08/05/23. Last dispensed at the pharmacy on 01/15/23 for 28 days, prescription now requires a refill per pharmacy records.   PA #/Case ID/Reference #: [id number]

## 2023-02-04 NOTE — Telephone Encounter (Signed)
Noted  

## 2023-02-04 NOTE — Telephone Encounter (Signed)
Refills sent to pharmacy. 

## 2023-02-04 NOTE — Telephone Encounter (Signed)
My chart vv, Pt has confirmed no new issues, pt aware RN will call if there are questions. Now that pt has scheduled her 1 yr f/u pt is requesting a refill on her medications : cyclobenzaprine (FLEXERIL) 10 MG tablet, amitriptyline (ELAVIL) 50 MG tablet, divalproex (DEPAKOTE) 500 MG DR tablet,  Atogepant (QULIPTA) 60 MG TABS   And  Ubrogepant (UBRELVY) 100 MG TABS  to [facility] [phone]

## 2023-02-04 NOTE — Telephone Encounter (Signed)
Pharmacy Patient Advocate Encounter   Received notification from Fax that prior authorization for Wegovy 1.7MG /0.75ML auto-injectorsis required/requested.   Insurance verification completed.   The patient is insured through [facility] .   Per test claim: PA required; PA submitted to above mentioned insurance via CoverMyMeds Key/confirmation #/EOC [id number] Status is pending    *RENEWAL*

## 2023-02-11 ENCOUNTER — Other Ambulatory Visit: Payer: Self-pay

## 2023-02-15 ENCOUNTER — Other Ambulatory Visit: Payer: Self-pay | Admitting: Dermatology

## 2023-02-15 DIAGNOSIS — L219 Seborrheic dermatitis, unspecified: Secondary | ICD-10-CM

## 2023-02-16 ENCOUNTER — Other Ambulatory Visit: Payer: Self-pay

## 2023-02-22 ENCOUNTER — Encounter: Payer: Self-pay | Admitting: Family Medicine

## 2023-02-23 ENCOUNTER — Other Ambulatory Visit: Payer: Self-pay

## 2023-02-23 MED ORDER — WEGOVY 1.7 MG/0.75ML ~~LOC~~ SOAJ
1.7000 mg | SUBCUTANEOUS | 0 refills | Status: DC
  Filled 2023-02-23: qty 3, 28d supply, fill #0

## 2023-03-04 ENCOUNTER — Other Ambulatory Visit: Payer: Self-pay

## 2023-03-04 MED ORDER — DULOXETINE HCL 30 MG PO CPEP
90.0000 mg | ORAL_CAPSULE | Freq: Every day | ORAL | 1 refills | Status: DC
  Filled 2023-03-04 – 2023-03-05 (×2): qty 90, 30d supply, fill #0
  Filled 2023-03-29: qty 90, 30d supply, fill #1

## 2023-03-05 ENCOUNTER — Other Ambulatory Visit: Payer: Self-pay

## 2023-03-18 ENCOUNTER — Ambulatory Visit: Payer: Managed Care, Other (non HMO) | Admitting: Nurse Practitioner

## 2023-03-18 ENCOUNTER — Encounter: Payer: Self-pay | Admitting: Nurse Practitioner

## 2023-03-18 VITALS — BP 107/76 | HR 105 | Temp 97.7°F | Ht 64.0 in | Wt 210.2 lb

## 2023-03-18 DIAGNOSIS — R42 Dizziness and giddiness: Secondary | ICD-10-CM

## 2023-03-18 DIAGNOSIS — M797 Fibromyalgia: Secondary | ICD-10-CM

## 2023-03-18 DIAGNOSIS — E785 Hyperlipidemia, unspecified: Secondary | ICD-10-CM

## 2023-03-18 DIAGNOSIS — G90A Postural orthostatic tachycardia syndrome (POTS): Secondary | ICD-10-CM | POA: Diagnosis not present

## 2023-03-18 DIAGNOSIS — G43109 Migraine with aura, not intractable, without status migrainosus: Secondary | ICD-10-CM

## 2023-03-18 DIAGNOSIS — F5104 Psychophysiologic insomnia: Secondary | ICD-10-CM

## 2023-03-18 DIAGNOSIS — K219 Gastro-esophageal reflux disease without esophagitis: Secondary | ICD-10-CM

## 2023-03-18 NOTE — Progress Notes (Unsigned)
Bethanie Dicker, NP-C Phone: [phone]  Margaret Silva is a 43 y.o. female who presents today for transfer of care.  ***  Social History   Tobacco Use  Smoking Status Never  . Passive exposure: Past  Smokeless Tobacco Never    Current Outpatient Medications on File Prior to Visit  Medication Sig Dispense Refill  . albuterol (VENTOLIN HFA) 108 (90 Base) MCG/ACT inhaler Inhale 2 puffs into the lungs every 4 (four) hours as needed for wheezing or shortness of breath. 18 each 0  . amitriptyline (ELAVIL) 50 MG tablet TAKE ONE TABLET (50 MG) BY MOUTH AT BEDTIME 90 tablet 0  . Atogepant (QULIPTA) 60 MG TABS Take 1 tablet (60 mg total) by mouth daily. 30 tablet 2  . Celecoxib (ELYXYB) 120 MG/4.8ML SOLN Take 120 mg by mouth daily as needed. 28.8 mL 11  . Cholecalciferol (VITAMIN D PO) Take 5,000 Int'l Units by mouth once a week.    . Clindamycin-Benzoyl Per, Refr, gel APPLY TOPICALLY TO THE AFFECTED AREA EVERY DAY 45 g 2  . cyclobenzaprine (FLEXERIL) 10 MG tablet Take 1 tablet (10 mg total) by mouth at bedtime. TAKE 1 TABLET(10 MG) BY MOUTH AT BEDTIME 90 tablet 0  . diclofenac (CATAFLAM) 50 MG tablet Take 50 mg by mouth daily.    . diclofenac sodium (VOLTAREN) 1 % GEL Apply 4 g topically 3 (three) times daily as needed.    . divalproex (DEPAKOTE) 500 MG DR tablet TAKE 1 TABLET(500 MG) BY MOUTH EVERY DAY 90 tablet 0  . DULoxetine (CYMBALTA) 30 MG capsule TAKE 3 CAPSULES BY MOUTH EVERY DAY 270 capsule 1  . DULoxetine (CYMBALTA) 30 MG capsule Take 3 capsules (90 mg total) by mouth daily. 270 capsule 1  . EPINEPHrine 0.3 mg/0.3 mL IJ SOAJ injection Inject into the muscle.    . fluticasone (FLONASE) 50 MCG/ACT nasal spray Place 2 sprays into both nostrils daily. (Patient taking differently: Place 2 sprays into both nostrils as needed.) 16 g 6  . gentamicin cream (GARAMYCIN) 0.1 % Apply 1 application topically as needed (for nose).    Marland Kitchen ketoconazole (NIZORAL) 2 % cream APPLY TO CREASE OF NOSE  TWICE DAILY AS NEEDED FOR FLAKE AND ITCH 60 g 1  . ketoconazole (NIZORAL) 2 % shampoo Apply to affected skin on the bacj once a day in the shower until resolved. 120 mL 6  . loratadine (CLARITIN) 10 MG tablet TAKE 1 TABLET(10 MG) BY MOUTH DAILY 90 tablet 3  . medroxyPROGESTERone (DEPO-PROVERA) 150 MG/ML injection Inject 1 mL (150 mg total) into the muscle every 3 (three) months. 1 mL 4  . methylPREDNISolone Acetate (DEPO-MEDROL IJ) Inject as directed every 3 (three) months. First injection June 2023    . omeprazole (PRILOSEC) 20 MG capsule TAKE 1 CAPSULE(20 MG) BY MOUTH DAILY 90 capsule 1  . ondansetron (ZOFRAN) 4 MG tablet Take 1 tablet (4 mg total) by mouth every 6 (six) hours. 30 tablet 0  . ondansetron (ZOFRAN-ODT) 4 MG disintegrating tablet Take 1 tablet (4 mg total) by mouth every 8 (eight) hours as needed for nausea. 30 tablet 12  . Semaglutide-Weight Management (WEGOVY) 1.7 MG/0.75ML SOAJ Inject 1.7 mg into the skin once a week. 3 mL 0  . SUMAtriptan (IMITREX) 100 MG tablet TAKE 1 TABLET(100 MG) BY MOUTH 1 TIME FOR UP TO 1 DOSE AS NEEDED. MAY REPEAT IN 2 HOURS IF HEADACHE PERSISTS OR RECURS 10 tablet 1  . Ubrogepant (UBRELVY) 100 MG TABS Take 1 tablet (100 mg  total) by mouth every 2 (two) hours as needed. Maximum 200mg  a day. 16 tablet 2  . valACYclovir (VALTREX) 500 MG tablet TAKE 1 TABLET(500 MG) BY MOUTH DAILY 30 tablet 3  . zolpidem (AMBIEN CR) 6.25 MG CR tablet Take 1 tablet (6.25 mg total) by mouth at bedtime as needed for sleep. 30 tablet 2   Current Facility-Administered Medications on File Prior to Visit  Medication Dose Route Frequency Provider Last Rate Last Admin  . medroxyPROGESTERone (DEPO-PROVERA) injection 150 mg  150 mg Intramuscular Q90 days Federico Flake, MD   150 mg at 12/15/21 1621  . medroxyPROGESTERone (DEPO-PROVERA) injection 150 mg  150 mg Intramuscular Q90 days Anyanwu, Ugonna A, MD   150 mg at 03/10/22 1605  . medroxyPROGESTERone Acetate SUSY 150 mg  150  mg Intramuscular Q90 days [place] Bing, MD   150 mg at 08/11/22 1413     ROS see history of present illness  Objective  Physical Exam There were no vitals filed for this visit.  BP Readings from Last 3 Encounters:  01/19/23 117/84  12/16/22 118/72  12/01/22 123/79   Wt Readings from Last 3 Encounters:  12/16/22 223 lb 3.2 oz (101.2 kg)  12/01/22 228 lb (103.4 kg)  11/03/22 230 lb (104.3 kg)    Physical Exam   Assessment/Plan: Please see individual problem list.  Migraine with aura and without status migrainosus, not intractable  Gastroesophageal reflux disease, unspecified whether esophagitis present  Chronic insomnia  Hyperlipidemia LDL goal <130  Morbid obesity (HCC)    No follow-ups on file.   Bethanie Dicker, NP-C [facility]

## 2023-03-19 LAB — CBC WITH DIFFERENTIAL/PLATELET
Basophils Absolute: 0 10*3/uL (ref 0.0–0.2)
Basos: 0 %
EOS (ABSOLUTE): 0.1 10*3/uL (ref 0.0–0.4)
Eos: 1 %
Hematocrit: 40.9 % (ref 34.0–46.6)
Hemoglobin: 13.5 g/dL (ref 11.1–15.9)
Immature Grans (Abs): 0.1 10*3/uL (ref 0.0–0.1)
Immature Granulocytes: 1 %
Lymphocytes Absolute: 1.7 10*3/uL (ref 0.7–3.1)
Lymphs: 20 %
MCH: 29.3 pg (ref 26.6–33.0)
MCHC: 33 g/dL (ref 31.5–35.7)
MCV: 89 fL (ref 79–97)
Monocytes Absolute: 0.3 10*3/uL (ref 0.1–0.9)
Monocytes: 4 %
Neutrophils Absolute: 6.3 10*3/uL (ref 1.4–7.0)
Neutrophils: 74 %
Platelets: 292 10*3/uL (ref 150–450)
RBC: 4.61 x10E6/uL (ref 3.77–5.28)
RDW: 15.5 % — ABNORMAL HIGH (ref 11.7–15.4)
WBC: 8.5 10*3/uL (ref 3.4–10.8)

## 2023-03-19 LAB — COMPREHENSIVE METABOLIC PANEL
ALT: 23 [IU]/L (ref 0–32)
AST: 11 [IU]/L (ref 0–40)
Albumin: 3.8 g/dL — ABNORMAL LOW (ref 3.9–4.9)
Alkaline Phosphatase: 108 [IU]/L (ref 44–121)
BUN/Creatinine Ratio: 11 (ref 9–23)
BUN: 12 mg/dL (ref 6–24)
Bilirubin Total: 0.3 mg/dL (ref 0.0–1.2)
CO2: 22 mmol/L (ref 20–29)
Calcium: 9.4 mg/dL (ref 8.7–10.2)
Chloride: 102 mmol/L (ref 96–106)
Creatinine, Ser: 1.08 mg/dL — ABNORMAL HIGH (ref 0.57–1.00)
Globulin, Total: 2.3 g/dL (ref 1.5–4.5)
Glucose: 180 mg/dL — ABNORMAL HIGH (ref 70–99)
Potassium: 5 mmol/L (ref 3.5–5.2)
Sodium: 137 mmol/L (ref 134–144)
Total Protein: 6.1 g/dL (ref 6.0–8.5)
eGFR: 66 mL/min/{1.73_m2} (ref >59)

## 2023-03-19 LAB — LIPID PANEL
Chol/HDL Ratio: 5 {ratio} — ABNORMAL HIGH (ref 0.0–4.4)
Cholesterol, Total: 181 mg/dL (ref 100–199)
HDL: 36 mg/dL — ABNORMAL LOW (ref >39)
LDL Chol Calc (NIH): 120 mg/dL — ABNORMAL HIGH (ref 0–99)
Triglycerides: 141 mg/dL (ref 0–149)
VLDL Cholesterol Cal: 25 mg/dL (ref 5–40)

## 2023-03-19 LAB — TSH: TSH: 4.24 u[IU]/mL (ref 0.450–4.500)

## 2023-03-19 LAB — IRON,TIBC AND FERRITIN PANEL
Ferritin: 67 ng/mL (ref 15–150)
Iron Saturation: 17 % (ref 15–55)
Iron: 59 ug/dL (ref 27–159)
Total Iron Binding Capacity: 342 ug/dL (ref 250–450)
UIBC: 283 ug/dL (ref 131–425)

## 2023-03-19 LAB — HEMOGLOBIN A1C
Est. average glucose Bld gHb Est-mCnc: 126 mg/dL
Hgb A1c MFr Bld: 6 % — ABNORMAL HIGH (ref 4.8–5.6)

## 2023-03-19 LAB — VITAMIN D 25 HYDROXY (VIT D DEFICIENCY, FRACTURES): Vit D, 25-Hydroxy: 37 ng/mL (ref 30.0–100.0)

## 2023-03-19 LAB — VITAMIN B12: Vitamin B-12: 642 pg/mL (ref 232–1245)

## 2023-03-23 ENCOUNTER — Encounter: Payer: Self-pay | Admitting: Nurse Practitioner

## 2023-03-26 MED ORDER — OMEPRAZOLE 20 MG PO CPDR: 20.0000 mg | DELAYED_RELEASE_CAPSULE | Freq: Every day | ORAL | 3 refills | Status: AC

## 2023-03-26 MED ORDER — ONDANSETRON HCL 4 MG PO TABS: 4.0000 mg | ORAL_TABLET | Freq: Four times a day (QID) | ORAL | 2 refills | Status: DC

## 2023-03-26 MED ORDER — WEGOVY 1.7 MG/0.75ML ~~LOC~~ SOAJ: 1.7000 mg | SUBCUTANEOUS | 5 refills | Status: DC

## 2023-03-26 MED ORDER — ZOLPIDEM TARTRATE ER 6.25 MG PO TBCR: 6.2500 mg | EXTENDED_RELEASE_TABLET | Freq: Every evening | ORAL | 5 refills | Status: DC | PRN

## 2023-03-26 NOTE — Assessment & Plan Note (Signed)
 Managed by neurology with multiple medications including amitriptyline , Qulipta , Depakote , Imitrex , Ubrelvy , Zofran , Cymbalta , and Flexeril . Continue current management under neurology.

## 2023-03-26 NOTE — Assessment & Plan Note (Signed)
 Managed by rheumatology. Continue current management under rheumatology.

## 2023-03-26 NOTE — Assessment & Plan Note (Signed)
 Recent increase in episodes, possibly related to POTS or another undetermined cause. Order labs to investigate potential causes. Consider repeat head imaging if dizziness continues or worsens. Follow up with Neurology.

## 2023-03-26 NOTE — Assessment & Plan Note (Signed)
 Not currently on medication. Encourage continued healthy diet and exercise as tolerated. We will check lipid panel today.

## 2023-03-26 NOTE — Assessment & Plan Note (Signed)
 Controlled with twice daily omeprazole  and lifestyle modifications. Continue current management.

## 2023-03-26 NOTE — Assessment & Plan Note (Signed)
 Long-term use of Ambien . Stable. Continue Ambien  6.25 mg daily at bedtime. PDMP reviewed. Refills sent.

## 2023-03-26 NOTE — Assessment & Plan Note (Addendum)
 A loss of 42 pounds was achieved on Wegovy , but it was stopped due to cost and side effects at the highest dose. Consider restarting Wegovy  at a lower dose or exploring other weight loss injection options at [facility] or [facility] Will refill Wegovy  1.7 mg weekly and check A1c today. Encouraged to continue working on healthy diet and exercising as tolerated.

## 2023-03-26 NOTE — Assessment & Plan Note (Signed)
 Increased frequency of falls and dizziness may be related to weight gain. There is no current specialist for POTS management. Continue slow transitions and avoidance of triggers.

## 2023-03-29 ENCOUNTER — Other Ambulatory Visit (HOSPITAL_COMMUNITY): Payer: Self-pay

## 2023-03-29 ENCOUNTER — Other Ambulatory Visit: Payer: Self-pay

## 2023-03-29 ENCOUNTER — Other Ambulatory Visit: Payer: Self-pay | Admitting: Nurse Practitioner

## 2023-03-29 DIAGNOSIS — F5104 Psychophysiologic insomnia: Secondary | ICD-10-CM

## 2023-04-06 ENCOUNTER — Ambulatory Visit: Payer: Managed Care, Other (non HMO)

## 2023-04-12 ENCOUNTER — Encounter: Payer: Self-pay | Admitting: Nurse Practitioner

## 2023-04-19 ENCOUNTER — Ambulatory Visit (INDEPENDENT_AMBULATORY_CARE_PROVIDER_SITE_OTHER): Payer: Managed Care, Other (non HMO)

## 2023-04-19 DIAGNOSIS — Z3042 Encounter for surveillance of injectable contraceptive: Secondary | ICD-10-CM

## 2023-04-19 MED ORDER — MEDROXYPROGESTERONE ACETATE 150 MG/ML IM SUSY
150.0000 mg | PREFILLED_SYRINGE | Freq: Once | INTRAMUSCULAR | Status: AC
  Administered 2023-04-19: 150 mg via INTRAMUSCULAR

## 2023-04-19 NOTE — Progress Notes (Signed)
 Date last pap: 06/18/2021. Last Depo-Provera: 01/19/23. Side Effects if any: None. Serum HCG indicated? N/A. Depo-Provera 150 mg IM given by: Moneisha Vosler L CMA . Next appointment due 05/19/-06-02.

## 2023-04-28 NOTE — Progress Notes (Unsigned)
 [facility] [address] [place]. [place] [address] [phone]       OFFICE FOLLOW UP NOTE  [name] Date of Birth:  [birth year] Medical Record Number:  [id number]    Primary neurologist: Dr. Lucia Gaskins Reason for visit: migraines   Virtual Visit via Video Note  I connected with Margaret Silva on 04/29/23 at  8:30 AM EDT by a video enabled telemedicine application and verified that I am speaking with the correct person using two identifiers.  Location: Patient: at home Provider: in office   I discussed the limitations of evaluation and management by telemedicine and the availability of in person appointments. The patient expressed understanding and agreed to proceed.    SUBJECTIVE:   Follow-up visit:  Prior visit: 10/23/2021 with Dr. Lucia Gaskins  Brief HPI:   Margaret Silva is a 43 y.o. female who is being followed for migraine headaches.  Initially seen by Dr. Lucia Gaskins in 07/2015 and reported daily headaches with at least 8 migrainous days per month, located right side with sharp pounding sensation, occasionally on left side, associated with photophobia, phonophobia and nausea.    At prior visit with Dr. Lucia Gaskins (1.5 years ago), reported 4-7 migraines per month and <12 headache days per month as well as cervicalgia.  She was started on Qulipta 60 mg daily in addition to Depakote and amitriptyline and continued on Ubrelvy, sumatriptan or Elyxyb for rescue.    Interval history:  Returns for follow-up visit via MyChart video visit.  Migraines previously very well-controlled on regimen of Qulipta, Depakote and amitriptyline with typically 1 migraine a month but some months went without any migraines but has had increased migraine frequency over the past month as well as experiencing sinus headaches.  She reports 4 migraines over the past 2 weeks and a almost daily sinus headaches.  She has been using Ubrelvy and sumatriptan for rescue typically  with benefit. Never tried Elyxyb (was not aware of this rx).       ROS:   14 system review of systems performed and negative with exception of those listed in HPI  PMH:  Past Medical History:  Diagnosis Date   Asthma    cough variant   Bursitis of left hip    Diverticulitis    h/o   Dysplastic nevus 09/03/2021   right lateral knee, moderate   Endometriosis    Fibromyalgia    GERD (gastroesophageal reflux disease)    Headache    History of chicken pox    History of colonoscopy 11/04 and April 06   History of laparoscopy 12/04 and 12/07   History of laparoscopy 01/29/2006   Dr. 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 but working on getting the diagnosis    PSH:  Past Surgical History:  Procedure Laterality Date   CYSTOSCOPY WITH HYDRODISTENSION AND BIOPSY     DIAGNOSTIC LAPAROSCOPY  02/16/2003   ESOPHAGOGASTRODUODENOSCOPY (EGD) WITH PROPOFOL N/A 09/30/2018   Procedure: ESOPHAGOGASTRODUODENOSCOPY (EGD) WITH PROPOFOL;  Surgeon: Pasty Spillers, MD;  Location: [facility];  Service: Endoscopy;  Laterality: N/A;   ESOPHAGOGASTRODUODENOSCOPY (EGD) WITH PROPOFOL N/A 02/03/2022   Procedure: ESOPHAGOGASTRODUODENOSCOPY (EGD) WITH PROPOFOL;  Surgeon: Wyline Mood,  MD;  Location: [facility];  Service: Gastroenterology;  Laterality: N/A;   SIGMOIDOSCOPY  05/26/10    Social History:  Social History   Socioeconomic History   Marital status: Divorced    Spouse name: Not on file   Number of children: 0   Years of education: 12    Highest education level: Not on file  Occupational History   Occupation: [employer]   Tobacco Use   Smoking status: Never    Passive exposure: Past   Smokeless  tobacco: Never  Vaping Use   Vaping status: Never Used  Substance and Sexual Activity   Alcohol use: Yes    Comment: rarely   Drug use: No   Sexual activity: Not Currently    Partners: Male  Other Topics Concern   Not on file  Social History Narrative   Lives with dog   Caffeine use:  coffee on occasion maybe once a week   Right handed   Social Drivers of Corporate investment banker Strain: Not on file  Food Insecurity: Not on file  Transportation Needs: Not on file  Physical Activity: Not on file  Stress: Not on file  Social Connections: Not on file  Intimate Partner Violence: Not on file    Family History:  Family History  Problem Relation Age of Onset   Fibromyalgia Mother    Migraines Mother    Cancer Mother 69       breast   Breast cancer Mother 59   Hypertension Father    Hyperlipidemia Father    Prostate cancer Father    Ehlers-Danlos syndrome Sister    Fibromyalgia Sister    Thyroid cancer Sister    Diabetes Maternal Grandmother    Arthritis Maternal Grandmother    Kidney disease Maternal Grandmother    Heart attack Maternal Grandfather    Heart disease Maternal Grandfather    Hypertension Maternal Grandfather    Mental illness Maternal Grandfather    Cancer Paternal Grandmother        Liver cancer/lung cancer   Diabetes Paternal Grandmother    Heart attack Paternal Grandfather    Cancer Paternal Grandfather        Pancreatic/ Bladder Cancer/prostate   Arthritis Paternal Grandfather    Heart disease Paternal Grandfather    Ovarian cancer Neg Hx    Colon cancer Neg Hx     Medications:   Current Outpatient Medications on File Prior to Visit  Medication Sig Dispense Refill   albuterol (VENTOLIN HFA) 108 (90 Base) MCG/ACT inhaler Inhale 2 puffs into the lungs every 4 (four) hours as needed for wheezing or shortness of breath. 18 each 0   amitriptyline (ELAVIL) 50 MG tablet TAKE ONE TABLET (50 MG) BY MOUTH AT BEDTIME 90 tablet 0   Atogepant  (QULIPTA) 60 MG TABS Take 1 tablet (60 mg total) by mouth daily. 30 tablet 2   Celecoxib (ELYXYB) 120 MG/4.8ML SOLN Take 120 mg by mouth daily as needed. 28.8 mL 11   Cholecalciferol (VITAMIN D PO) Take 5,000 Int'l Units by mouth once a week.     Clindamycin-Benzoyl Per, Refr, gel APPLY TOPICALLY TO THE AFFECTED AREA EVERY DAY 45 g 2   cyclobenzaprine (FLEXERIL) 10 MG tablet Take 1 tablet (10 mg total) by mouth at bedtime. TAKE 1 TABLET(10 MG) BY MOUTH AT BEDTIME 90 tablet 0   diclofenac (CATAFLAM) 50 MG tablet Take 50 mg by mouth daily.     diclofenac sodium (VOLTAREN) 1 % GEL  Apply 4 g topically 3 (three) times daily as needed.     divalproex (DEPAKOTE) 500 MG DR tablet TAKE 1 TABLET(500 MG) BY MOUTH EVERY DAY 90 tablet 0   DULoxetine (CYMBALTA) 30 MG capsule TAKE 3 CAPSULES BY MOUTH EVERY DAY 270 capsule 1   DULoxetine (CYMBALTA) 30 MG capsule Take 3 capsules (90 mg total) by mouth daily. 270 capsule 1   EPINEPHrine 0.3 mg/0.3 mL IJ SOAJ injection Inject into the muscle.     fluticasone (FLONASE) 50 MCG/ACT nasal spray Place 2 sprays into both nostrils daily. (Patient taking differently: Place 2 sprays into both nostrils as needed.) 16 g 6   gentamicin cream (GARAMYCIN) 0.1 % Apply 1 application topically as needed (for nose).     ketoconazole (NIZORAL) 2 % cream APPLY TO CREASE OF NOSE TWICE DAILY AS NEEDED FOR FLAKE AND ITCH 60 g 1   ketoconazole (NIZORAL) 2 % shampoo Apply to affected skin on the bacj once a day in the shower until resolved. 120 mL 6   loratadine (CLARITIN) 10 MG tablet TAKE 1 TABLET(10 MG) BY MOUTH DAILY 90 tablet 3   medroxyPROGESTERone (DEPO-PROVERA) 150 MG/ML injection Inject 1 mL (150 mg total) into the muscle every 3 (three) months. 1 mL 4   methylPREDNISolone Acetate (DEPO-MEDROL IJ) Inject as directed every 3 (three) months. First injection June 2023     omeprazole (PRILOSEC) 20 MG capsule Take 1 capsule (20 mg total) by mouth daily. 90 capsule 3   ondansetron  (ZOFRAN) 4 MG tablet Take 1 tablet (4 mg total) by mouth every 6 (six) hours. 30 tablet 2   Semaglutide-Weight Management (WEGOVY) 1.7 MG/0.75ML SOAJ Inject 1.7 mg into the skin once a week. 3 mL 5   SUMAtriptan (IMITREX) 100 MG tablet TAKE 1 TABLET(100 MG) BY MOUTH 1 TIME FOR UP TO 1 DOSE AS NEEDED. MAY REPEAT IN 2 HOURS IF HEADACHE PERSISTS OR RECURS 10 tablet 1   Ubrogepant (UBRELVY) 100 MG TABS Take 1 tablet (100 mg total) by mouth every 2 (two) hours as needed. Maximum 200mg  a day. 16 tablet 2   valACYclovir (VALTREX) 500 MG tablet TAKE 1 TABLET(500 MG) BY MOUTH DAILY 30 tablet 3   zolpidem (AMBIEN CR) 6.25 MG CR tablet Take 1 tablet (6.25 mg total) by mouth at bedtime as needed for sleep. 30 tablet 5   Current Facility-Administered Medications on File Prior to Visit  Medication Dose Route Frequency Provider Last Rate Last Admin   medroxyPROGESTERone (DEPO-PROVERA) injection 150 mg  150 mg Intramuscular Q90 days Federico Flake, MD   150 mg at 12/15/21 1621   medroxyPROGESTERone (DEPO-PROVERA) injection 150 mg  150 mg Intramuscular Q90 days Anyanwu, Ugonna A, MD   150 mg at 03/10/22 1605   medroxyPROGESTERone Acetate SUSY 150 mg  150 mg Intramuscular Q90 days [place] Bing, MD   150 mg at 08/11/22 1413    Allergies:   Allergies  Allergen Reactions   Coffee Bean Extract  [Coffea Arabica] Nausea Only   Other     Outdoor allergies    Trazodone And Nefazodone Other (See Comments)    irritability      OBJECTIVE:  Physical Exam  General: well developed, well nourished, seated, in no evident distress  Neurologic Exam Mental Status: Awake and fully alert. Oriented to place and time. Recent and remote memory intact. Attention span, concentration and fund of knowledge appropriate. Mood and affect appropriate.         ASSESSMENT/PLAN: Margaret Silva is  a 43 y.o. year old female     Migraine headaches:  Increased frequency over the past month suspect due to  allergies/sinuses with almost daily sinus headaches, previously very well controlled with 0-1 migraines per month Recommend continue with current treatment plan for now but if migraines persist, she was advised to schedule a follow up visit to discuss other treatment options Prevention: Continue Qulipta 60 mg daily, Depakote 500 mg daily and amitriptyline 50 mg nightly -refill provided Rescue: Ubrelvy 100mg  PRN, sumatriptan PRN, zofran PRN -refill provided Tried/failed: depakote, topamax, notriptyline, cambia, botox, propranolol, amitriptyline, cymbalta, sumatriptan, maxalt, celebrex, cambia, ibuprofen, tylenol, celebrex, flexeril, diclofenac, celecoxyb tab, naproxyn , verapamil, indomethacin.      Follow up in 1 year or call earlier if needed   CC:  PCP: Bethanie Dicker, NP    I spent 15 minutes of face-to-face and non-face-to-face time with patient via MyChart video visit.  This included previsit chart review, lab review, study review, order entry, electronic health record documentation, patient education and discussion regarding above diagnoses and treatment plan and answered all other questions to patient's satisfaction  Ihor Austin, [facility]  [facility] [address] [place], [place] [address]  Phone [phone] Fax [phone] Note: This document was prepared with digital dictation and possible smart phrase technology. Any transcriptional errors that result from this process are unintentional.

## 2023-04-29 ENCOUNTER — Telehealth: Payer: Managed Care, Other (non HMO) | Admitting: Adult Health

## 2023-04-29 ENCOUNTER — Encounter: Payer: Self-pay | Admitting: Adult Health

## 2023-04-29 DIAGNOSIS — G43009 Migraine without aura, not intractable, without status migrainosus: Secondary | ICD-10-CM | POA: Diagnosis not present

## 2023-04-29 MED ORDER — AMITRIPTYLINE HCL 50 MG PO TABS: ORAL_TABLET | ORAL | 0 refills | Status: DC

## 2023-04-29 MED ORDER — ONDANSETRON HCL 4 MG PO TABS: 4.0000 mg | ORAL_TABLET | Freq: Four times a day (QID) | ORAL | 11 refills | Status: AC

## 2023-04-29 MED ORDER — QULIPTA 60 MG PO TABS: 60.0000 mg | ORAL_TABLET | Freq: Every day | ORAL | 2 refills | Status: DC

## 2023-04-29 MED ORDER — SUMATRIPTAN SUCCINATE 100 MG PO TABS: 100.0000 mg | ORAL_TABLET | ORAL | 11 refills | Status: AC | PRN

## 2023-04-29 MED ORDER — UBRELVY 100 MG PO TABS: 100.0000 mg | ORAL_TABLET | ORAL | 11 refills | Status: AC | PRN

## 2023-04-29 MED ORDER — DIVALPROEX SODIUM 500 MG PO DR TAB
DELAYED_RELEASE_TABLET | ORAL | 0 refills | Status: DC
Start: 2023-04-29 — End: 2023-08-31

## 2023-05-04 ENCOUNTER — Other Ambulatory Visit: Payer: Self-pay | Admitting: Neurology

## 2023-05-04 DIAGNOSIS — G43009 Migraine without aura, not intractable, without status migrainosus: Secondary | ICD-10-CM

## 2023-05-07 ENCOUNTER — Other Ambulatory Visit: Payer: Self-pay | Admitting: Neurology

## 2023-05-07 DIAGNOSIS — G43009 Migraine without aura, not intractable, without status migrainosus: Secondary | ICD-10-CM

## 2023-05-29 ENCOUNTER — Other Ambulatory Visit: Payer: Self-pay | Admitting: Family Medicine

## 2023-05-29 DIAGNOSIS — Z3042 Encounter for surveillance of injectable contraceptive: Secondary | ICD-10-CM

## 2023-06-14 ENCOUNTER — Telehealth: Payer: Self-pay

## 2023-06-14 ENCOUNTER — Ambulatory Visit: Payer: Managed Care, Other (non HMO)

## 2023-06-14 ENCOUNTER — Other Ambulatory Visit (HOSPITAL_COMMUNITY): Payer: Self-pay

## 2023-06-14 NOTE — Telephone Encounter (Signed)
 Pharmacy Patient Advocate Encounter   Received notification from CoverMyMeds that prior authorization for Qulipta  60MG  tablets is required/requested.   Insurance verification completed.   The patient is insured through [facility] .   Per test claim: PA required; PA started via CoverMyMeds. KEY [id number] . Waiting for clinical questions to populate.

## 2023-06-22 ENCOUNTER — Other Ambulatory Visit (HOSPITAL_COMMUNITY): Payer: Self-pay

## 2023-06-22 NOTE — Telephone Encounter (Signed)
 Pharmacy Patient Advocate Encounter   Received notification from CoverMyMeds that prior authorization for Qulipta  60MG  tablets is required/requested.   Insurance verification completed.   The patient is insured through [facility] .   Per test claim: PA required; PA submitted to above mentioned insurance via CoverMyMeds Key/confirmation #/EOC [id number] Status is pending

## 2023-06-23 ENCOUNTER — Other Ambulatory Visit (HOSPITAL_COMMUNITY): Payer: Self-pay

## 2023-06-23 NOTE — Telephone Encounter (Signed)
 Pharmacy Patient Advocate Encounter  Received notification from [employer] that Prior Authorization for Qulipta  60MG  tablets has been APPROVED from 06/22/2023 to 12/23/2023. Ran test claim, Copay is $0. This test claim was processed through [facility]- copay amounts may vary at other pharmacies due to pharmacy/plan contracts, or as the patient moves through the different stages of their insurance plan.   PA #/Case ID/Reference #: PA Case ID #: [id number]

## 2023-07-06 ENCOUNTER — Other Ambulatory Visit: Payer: Self-pay

## 2023-07-07 ENCOUNTER — Ambulatory Visit

## 2023-07-07 DIAGNOSIS — Z3042 Encounter for surveillance of injectable contraceptive: Secondary | ICD-10-CM

## 2023-07-07 MED ORDER — MEDROXYPROGESTERONE ACETATE 150 MG/ML IM SUSP
150.0000 mg | Freq: Once | INTRAMUSCULAR | Status: AC
  Administered 2023-07-07: 150 mg via INTRAMUSCULAR

## 2023-07-07 NOTE — Progress Notes (Signed)
 Date last pap: 2023-WNL. Last Depo-Provera : 04/19/23. Side Effects if any: NA. Serum HCG indicated? NA. Depo-Provera  150 mg IM given by: Tavari Loadholt,CMA . Next appointment due 09/22/23-8/20-25.   PT SUPPLIED

## 2023-08-03 ENCOUNTER — Other Ambulatory Visit: Payer: Self-pay

## 2023-08-03 MED ORDER — DULOXETINE HCL 30 MG PO CPEP: 90.0000 mg | ORAL_CAPSULE | Freq: Every day | ORAL | 1 refills | Status: DC

## 2023-08-25 ENCOUNTER — Other Ambulatory Visit: Payer: Self-pay

## 2023-08-25 DIAGNOSIS — G43009 Migraine without aura, not intractable, without status migrainosus: Secondary | ICD-10-CM

## 2023-08-25 MED ORDER — QULIPTA 60 MG PO TABS: 60.0000 mg | ORAL_TABLET | Freq: Every day | ORAL | 5 refills | Status: DC

## 2023-08-26 ENCOUNTER — Other Ambulatory Visit: Payer: Self-pay | Admitting: Neurology

## 2023-08-26 ENCOUNTER — Encounter: Payer: Self-pay | Admitting: Adult Health

## 2023-08-26 ENCOUNTER — Other Ambulatory Visit: Payer: Self-pay

## 2023-08-26 DIAGNOSIS — B009 Herpesviral infection, unspecified: Secondary | ICD-10-CM

## 2023-08-26 MED ORDER — VALACYCLOVIR HCL 500 MG PO TABS
500.0000 mg | ORAL_TABLET | Freq: Every day | ORAL | 5 refills | Status: AC
  Filled 2023-08-26: qty 30, 30d supply, fill #0

## 2023-08-26 MED ORDER — CYCLOBENZAPRINE HCL 10 MG PO TABS: 10.0000 mg | ORAL_TABLET | Freq: Every day | ORAL | 3 refills | Status: AC

## 2023-08-26 NOTE — Telephone Encounter (Signed)
 Efax received for valacyclovir     LOV-03-18-23 NOV-09-17-23

## 2023-08-27 ENCOUNTER — Other Ambulatory Visit: Payer: Self-pay | Admitting: Neurology

## 2023-08-27 DIAGNOSIS — G43009 Migraine without aura, not intractable, without status migrainosus: Secondary | ICD-10-CM

## 2023-09-17 ENCOUNTER — Other Ambulatory Visit (HOSPITAL_COMMUNITY): Payer: Self-pay

## 2023-09-17 ENCOUNTER — Telehealth: Payer: Self-pay

## 2023-09-17 ENCOUNTER — Ambulatory Visit (INDEPENDENT_AMBULATORY_CARE_PROVIDER_SITE_OTHER): Payer: Managed Care, Other (non HMO) | Admitting: Nurse Practitioner

## 2023-09-17 DIAGNOSIS — F32A Depression, unspecified: Secondary | ICD-10-CM

## 2023-09-17 DIAGNOSIS — F419 Anxiety disorder, unspecified: Secondary | ICD-10-CM | POA: Diagnosis not present

## 2023-09-17 DIAGNOSIS — F5104 Psychophysiologic insomnia: Secondary | ICD-10-CM

## 2023-09-17 MED ORDER — NALTREXONE-BUPROPION HCL ER 8-90 MG PO TB12
ORAL_TABLET | ORAL | 3 refills | Status: DC
Start: 2023-09-17 — End: 2023-12-23

## 2023-09-17 MED ORDER — ZOLPIDEM TARTRATE ER 6.25 MG PO TBCR: 6.2500 mg | EXTENDED_RELEASE_TABLET | Freq: Every evening | ORAL | 5 refills | Status: AC | PRN

## 2023-09-17 NOTE — Progress Notes (Signed)
 Leron Glance, NP-C Phone: [phone]  Margaret Silva is a 43 y.o. female who presents today for follow up.   Discussed the use of AI scribe software for clinical note transcription with the patient, who gave verbal consent to proceed.  History of Present Illness   Margaret Silva is a 43 year old female who presents for weight management and medication review.  She is seeking assistance with weight management. She previously used Wegovy , which was effective, but it is now too expensive and unavailable. She is interested in exploring other options, including a pill form of medication. She has a history of using Wellbutrin  without issues and is not currently on it. No history of seizures.  Her diet and exercise routine have improved. She walks more, including to the mailbox and a neighbor's house, and is trying to build endurance. She can no longer afford chiropractic care after June. She is attempting to eat less but struggles with hunger at night. She primarily eats at home and has reduced soda intake, only consuming it occasionally when dining out. Her diet is carb-heavy, and she is working on incorporating more protein and reducing carbs.  She has been on Ambien  for sleep since she was 43 years old and takes it within 30 minutes of going to bed. She reports good sleep quality with its use and has not attempted to discontinue it due to poor sleep without it.  She is currently taking Cymbalta  at a dose of 90 mg (three 30 mg tablets). Her mood, anxiety, and depression are generally good, though she experiences stress related to family dynamics, particularly concerning her younger sister and her niece.  She experiences headaches, which she attributes to weather changes. She continues to see her specialists in neurology and rheumatology and has recently received medication refills from neurology.      Social History   Tobacco Use  Smoking Status Never   Passive exposure: Past   Smokeless Tobacco Never    Current Outpatient Medications on File Prior to Visit  Medication Sig Dispense Refill   albuterol  (VENTOLIN  HFA) 108 (90 Base) MCG/ACT inhaler Inhale 2 puffs into the lungs every 4 (four) hours as needed for wheezing or shortness of breath. 18 each 0   amitriptyline  (ELAVIL ) 50 MG tablet TAKE 1 TABLET(50 MG) BY MOUTH AT BEDTIME 90 tablet 0   Atogepant  (QULIPTA ) 60 MG TABS Take 1 tablet (60 mg total) by mouth daily. 30 tablet 5   Cholecalciferol (VITAMIN D  PO) Take 5,000 Int'l Units by mouth once a week.     Clindamycin -Benzoyl Per, Refr, gel APPLY TOPICALLY TO THE AFFECTED AREA EVERY DAY 45 g 2   cyclobenzaprine  (FLEXERIL ) 10 MG tablet Take 1 tablet (10 mg total) by mouth at bedtime. 90 tablet 3   diclofenac  (CATAFLAM ) 50 MG tablet Take 50 mg by mouth daily.     diclofenac  sodium (VOLTAREN) 1 % GEL Apply 4 g topically 3 (three) times daily as needed.     divalproex  (DEPAKOTE ) 500 MG DR tablet TAKE 1 TABLET(500 MG) BY MOUTH EVERY DAY 90 tablet 1   DULoxetine  (CYMBALTA ) 30 MG capsule Take 3 capsules (90 mg total) by mouth daily. 270 capsule 1   EPINEPHrine 0.3 mg/0.3 mL IJ SOAJ injection Inject into the muscle.     fluticasone  (FLONASE ) 50 MCG/ACT nasal spray Place 2 sprays into both nostrils daily. (Patient taking differently: Place 2 sprays into both nostrils as needed.) 16 g 6   gentamicin cream (GARAMYCIN) 0.1 % Apply  1 application topically as needed (for nose).     ketoconazole  (NIZORAL ) 2 % cream APPLY TO CREASE OF NOSE TWICE DAILY AS NEEDED FOR FLAKE AND ITCH 60 g 1   loratadine  (CLARITIN ) 10 MG tablet TAKE 1 TABLET(10 MG) BY MOUTH DAILY 90 tablet 3   medroxyPROGESTERone  (DEPO-PROVERA ) 150 MG/ML injection Inject 1 mL (150 mg total) into the muscle every 3 (three) months. 1 mL 4   methylPREDNISolone  Acetate (DEPO-MEDROL  IJ) Inject as directed every 3 (three) months. First injection June 2023     omeprazole  (PRILOSEC) 20 MG capsule Take 1 capsule (20 mg total)  by mouth daily. 90 capsule 3   ondansetron  (ZOFRAN ) 4 MG tablet Take 1 tablet (4 mg total) by mouth every 6 (six) hours. 30 tablet 11   SUMAtriptan  (IMITREX ) 100 MG tablet Take 1 tablet (100 mg total) by mouth as needed for migraine. May repeat in 2 hours if headache persists or recurs. 10 tablet 11   Ubrogepant  (UBRELVY ) 100 MG TABS Take 1 tablet (100 mg total) by mouth every 2 (two) hours as needed. Maximum 200mg  a day. 16 tablet 11   valACYclovir  (VALTREX ) 500 MG tablet Take 1 tablet (500 mg total) by mouth daily. 30 tablet 5   Current Facility-Administered Medications on File Prior to Visit  Medication Dose Route Frequency Provider Last Rate Last Admin   medroxyPROGESTERone  (DEPO-PROVERA ) injection 150 mg  150 mg Intramuscular Q90 days Eldonna Suzen Octave, MD   150 mg at 12/15/21 1621   medroxyPROGESTERone  (DEPO-PROVERA ) injection 150 mg  150 mg Intramuscular Q90 days Anyanwu, Ugonna A, MD   150 mg at 03/10/22 1605   medroxyPROGESTERone  Acetate SUSY 150 mg  150 mg Intramuscular Q90 days Izell Harari, MD   150 mg at 08/11/22 1413     ROS see history of present illness  Objective  Physical Exam Vitals:   09/17/23 1529  BP: 110/76  Pulse: 90  Temp: 98.1 F (36.7 C)  SpO2: 98%    BP Readings from Last 3 Encounters:  09/17/23 110/76  03/18/23 107/76  01/19/23 117/84   Wt Readings from Last 3 Encounters:  09/17/23 226 lb 6.4 oz (102.7 kg)  03/18/23 210 lb 3.2 oz (95.3 kg)  12/16/22 223 lb 3.2 oz (101.2 kg)    Physical Exam Constitutional:      General: She is not in acute distress.    Appearance: Normal appearance.  HENT:     Head: Normocephalic.  Cardiovascular:     Rate and Rhythm: Normal rate and regular rhythm.     Heart sounds: Normal heart sounds.  Pulmonary:     Effort: Pulmonary effort is normal.     Breath sounds: Normal breath sounds.  Skin:    General: Skin is warm and dry.  Neurological:     General: No focal deficit present.     Mental Status:  She is alert.  Psychiatric:        Mood and Affect: Mood normal.        Behavior: Behavior normal.      Assessment/Plan: Please see individual problem list.  Morbid obesity (HCC) Assessment & Plan: Obesity management is ongoing. Wegovy  was discontinued due to cost and availability. Contrave  is suggested as an alternative. She is motivated to improve her diet and exercise. Prescribe Contrave  via mail order specialty pharmacy. Counseled on common side effects. Information provided. Advise a low-carb, high-protein diet and encourage regular physical activity, gradually increasing endurance. We will continue to monitor.   Orders: -  Naltrexone -buPROPion  HCl ER; Start 1 tablet every morning for 7 days, then 1 tablet twice daily for 7 days, then 2 tablets every morning and one in the evening  Dispense: 120 tablet; Refill: 3  Chronic insomnia Assessment & Plan: Chronic insomnia has been managed with long-term use of Ambien . She reports good sleep quality. Continue Ambien  6.25 mg daily at bedtime. PDMP reviewed. Refills provided.   Orders: -     Zolpidem  Tartrate ER; Take 1 tablet (6.25 mg total) by mouth at bedtime as needed for sleep.  Dispense: 30 tablet; Refill: 5  Anxiety and depression Assessment & Plan: Depression is managed with Cymbalta  90 mg daily. She reports a good mood, and Contrave  may provide additional mood stabilization. Continue Cymbalta  90 mg daily. Encouraged to contact if worsening symptoms, unusual behavior changes or suicidal thoughts occur.       Return in about 3 months (around 12/18/2023) for Follow up.   Leron Glance, NP-C [facility]

## 2023-09-17 NOTE — Telephone Encounter (Signed)
 Pharmacy Patient Advocate Encounter   Received notification from CoverMyMeds that prior authorization for Ubrelvy  100MG  tablets is required/requested.   Insurance verification completed.   The patient is insured through [facility] .   Per test claim: PA required; PA submitted to above mentioned insurance via CoverMyMeds Key/confirmation #/EOC (Key: [id number])   Status is pending

## 2023-09-18 LAB — LIPID PANEL
Cholesterol: 197 (ref 0–200)
HDL: 37 (ref 35–70)
LDL Cholesterol: 129
Triglycerides: 172 — AB (ref 40–160)

## 2023-09-18 LAB — HEMOGLOBIN A1C: Hemoglobin A1C: 7.2

## 2023-09-18 LAB — BASIC METABOLIC PANEL WITH GFR: Glucose: 142

## 2023-09-20 ENCOUNTER — Other Ambulatory Visit (HOSPITAL_COMMUNITY): Payer: Self-pay

## 2023-09-20 NOTE — Telephone Encounter (Signed)
 Pharmacy Patient Advocate Encounter  Received notification from [employer] that Prior Authorization for Ubrelvy  100MG  tablets has been APPROVED from 8.1.25 to 8.1.27. Ran test claim, Copay is $0.00. This test claim was processed through [facility]- copay amounts may vary at other pharmacies due to pharmacy/plan contracts, or as the patient moves through the different stages of their insurance plan.   PA #/Case ID/Reference #: (Key: [id number])

## 2023-09-21 ENCOUNTER — Encounter: Payer: Self-pay | Admitting: Nurse Practitioner

## 2023-09-21 ENCOUNTER — Other Ambulatory Visit: Payer: Self-pay

## 2023-09-22 ENCOUNTER — Ambulatory Visit

## 2023-09-22 DIAGNOSIS — Z3042 Encounter for surveillance of injectable contraceptive: Secondary | ICD-10-CM | POA: Diagnosis not present

## 2023-09-22 MED ORDER — MEDROXYPROGESTERONE ACETATE 150 MG/ML IM SUSP
150.0000 mg | Freq: Once | INTRAMUSCULAR | Status: AC
  Administered 2023-09-22: 150 mg via INTRAMUSCULAR

## 2023-09-22 NOTE — Progress Notes (Signed)
 Date last pap: 06/18/21. Last Depo-Provera : 07/07/23. Side Effects if any: NA. Serum HCG indicated? NA. Depo-Provera  150 mg IM given by: Loula Marcella,CMA. Next appointment due 12/08/23-12/22/23.

## 2023-09-27 ENCOUNTER — Encounter: Payer: Self-pay | Admitting: Nurse Practitioner

## 2023-09-27 NOTE — Assessment & Plan Note (Signed)
 Obesity management is ongoing. Wegovy  was discontinued due to cost and availability. Contrave  is suggested as an alternative. She is motivated to improve her diet and exercise. Prescribe Contrave  via mail order specialty pharmacy. Counseled on common side effects. Information provided. Advise a low-carb, high-protein diet and encourage regular physical activity, gradually increasing endurance. We will continue to monitor.

## 2023-09-27 NOTE — Assessment & Plan Note (Signed)
 Chronic insomnia has been managed with long-term use of Ambien . She reports good sleep quality. Continue Ambien  6.25 mg daily at bedtime. PDMP reviewed. Refills provided.

## 2023-09-27 NOTE — Assessment & Plan Note (Signed)
 Depression is managed with Cymbalta  90 mg daily. She reports a good mood, and Contrave  may provide additional mood stabilization. Continue Cymbalta  90 mg daily. Encouraged to contact if worsening symptoms, unusual behavior changes or suicidal thoughts occur.

## 2023-10-04 ENCOUNTER — Other Ambulatory Visit: Payer: Self-pay

## 2023-10-13 ENCOUNTER — Ambulatory Visit (INDEPENDENT_AMBULATORY_CARE_PROVIDER_SITE_OTHER): Admitting: Nurse Practitioner

## 2023-10-13 VITALS — BP 128/86 | HR 80 | Temp 98.0°F | Ht 64.0 in | Wt 224.0 lb

## 2023-10-13 DIAGNOSIS — E785 Hyperlipidemia, unspecified: Secondary | ICD-10-CM

## 2023-10-13 DIAGNOSIS — Z7985 Long-term (current) use of injectable non-insulin antidiabetic drugs: Secondary | ICD-10-CM

## 2023-10-13 DIAGNOSIS — E119 Type 2 diabetes mellitus without complications: Secondary | ICD-10-CM

## 2023-10-13 DIAGNOSIS — G90A Postural orthostatic tachycardia syndrome (POTS): Secondary | ICD-10-CM | POA: Diagnosis not present

## 2023-10-13 MED ORDER — TIRZEPATIDE 2.5 MG/0.5ML ~~LOC~~ SOAJ: 2.5000 mg | SUBCUTANEOUS | 0 refills | Status: DC

## 2023-10-13 MED ORDER — TIRZEPATIDE 5 MG/0.5ML ~~LOC~~ SOAJ: 5.0000 mg | SUBCUTANEOUS | 0 refills | Status: DC

## 2023-10-13 NOTE — Progress Notes (Signed)
 Margaret Glance, NP-C Phone: [phone]  Margaret Silva is a 43 y.o. female who presents today for lab review.  Discussed the use of AI scribe software for clinical note transcription with the patient, who gave verbal consent to proceed.  History of Present Illness   Margaret Silva is a 43 year old female with prediabetes who presents for management of elevated A1c.  She was recently diagnosed with Type 2 Diabetes, with an A1c of 7.2%, discovered during routine lab work for her job. She has no prior history of prediabetes and is exploring options to manage her blood sugar levels, including potential medication adjustments.  Previously on Wegovy  for weight management and tolerated well. Discontinued due to cost. Interested in starting Mounjaro  for new onset diabetes. She is aware of potential side effects such as nausea, constipation, diarrhea, and acid reflux.  She has a history of Postural Orthostatic Tachycardia Syndrome (POTS).  She is currently on doxycycline and prednisone  for an upper respiratory infection, which she contracted after exposure to a sick family member. She recently visited her ENT and switched from allergy shots to drops due to time constraints.  Her recent lab work also indicated slightly elevated cholesterol levels, although she is not currently on any medication for this condition.      Social History   Tobacco Use  Smoking Status Never   Passive exposure: Past  Smokeless Tobacco Never    Current Outpatient Medications on File Prior to Visit  Medication Sig Dispense Refill   albuterol  (VENTOLIN  HFA) 108 (90 Base) MCG/ACT inhaler Inhale 2 puffs into the lungs every 4 (four) hours as needed for wheezing or shortness of breath. 18 each 0   amitriptyline  (ELAVIL ) 50 MG tablet TAKE 1 TABLET(50 MG) BY MOUTH AT BEDTIME 90 tablet 0   Atogepant  (QULIPTA ) 60 MG TABS Take 1 tablet (60 mg total) by mouth daily. 30 tablet 5   Cholecalciferol (VITAMIN D  PO)  Take 5,000 Int'l Units by mouth once a week.     Clindamycin -Benzoyl Per, Refr, gel APPLY TOPICALLY TO THE AFFECTED AREA EVERY DAY 45 g 2   cyclobenzaprine  (FLEXERIL ) 10 MG tablet Take 1 tablet (10 mg total) by mouth at bedtime. 90 tablet 3   diclofenac  (CATAFLAM ) 50 MG tablet Take 50 mg by mouth daily.     diclofenac  sodium (VOLTAREN) 1 % GEL Apply 4 g topically 3 (three) times daily as needed.     divalproex  (DEPAKOTE ) 500 MG DR tablet TAKE 1 TABLET(500 MG) BY MOUTH EVERY DAY 90 tablet 1   DULoxetine  (CYMBALTA ) 30 MG capsule Take 3 capsules (90 mg total) by mouth daily. 270 capsule 1   EPINEPHrine 0.3 mg/0.3 mL IJ SOAJ injection Inject into the muscle.     fluticasone  (FLONASE ) 50 MCG/ACT nasal spray Place 2 sprays into both nostrils daily. 16 g 6   gentamicin cream (GARAMYCIN) 0.1 % Apply 1 application topically as needed (for nose).     ketoconazole  (NIZORAL ) 2 % cream APPLY TO CREASE OF NOSE TWICE DAILY AS NEEDED FOR FLAKE AND ITCH 60 g 1   loratadine  (CLARITIN ) 10 MG tablet TAKE 1 TABLET(10 MG) BY MOUTH DAILY 90 tablet 3   medroxyPROGESTERone  (DEPO-PROVERA ) 150 MG/ML injection Inject 1 mL (150 mg total) into the muscle every 3 (three) months. 1 mL 4   methylPREDNISolone  Acetate (DEPO-MEDROL  IJ) Inject as directed every 3 (three) months. First injection June 2023     Naltrexone -buPROPion  HCl ER 8-90 MG TB12 Start 1 tablet every morning for  7 days, then 1 tablet twice daily for 7 days, then 2 tablets every morning and one in the evening 120 tablet 3   omeprazole  (PRILOSEC) 20 MG capsule Take 1 capsule (20 mg total) by mouth daily. 90 capsule 3   ondansetron  (ZOFRAN ) 4 MG tablet Take 1 tablet (4 mg total) by mouth every 6 (six) hours. 30 tablet 11   SUMAtriptan  (IMITREX ) 100 MG tablet Take 1 tablet (100 mg total) by mouth as needed for migraine. May repeat in 2 hours if headache persists or recurs. 10 tablet 11   Ubrogepant  (UBRELVY ) 100 MG TABS Take 1 tablet (100 mg total) by mouth every 2  (two) hours as needed. Maximum 200mg  a day. 16 tablet 11   valACYclovir  (VALTREX ) 500 MG tablet Take 1 tablet (500 mg total) by mouth daily. 30 tablet 5   zolpidem  (AMBIEN  CR) 6.25 MG CR tablet Take 1 tablet (6.25 mg total) by mouth at bedtime as needed for sleep. 30 tablet 5   Current Facility-Administered Medications on File Prior to Visit  Medication Dose Route Frequency Provider Last Rate Last Admin   medroxyPROGESTERone  (DEPO-PROVERA ) injection 150 mg  150 mg Intramuscular Q90 days Eldonna Suzen Octave, MD   150 mg at 12/15/21 1621   medroxyPROGESTERone  (DEPO-PROVERA ) injection 150 mg  150 mg Intramuscular Q90 days Anyanwu, Ugonna A, MD   150 mg at 03/10/22 1605   medroxyPROGESTERone  Acetate SUSY 150 mg  150 mg Intramuscular Q90 days Izell Harari, MD   150 mg at 08/11/22 1413     ROS see history of present illness  Objective  Physical Exam Vitals:   10/13/23 1402  BP: 128/86  Pulse: 80  Temp: 98 F (36.7 C)  SpO2: 98%    BP Readings from Last 3 Encounters:  10/21/23 128/86  10/13/23 128/86  09/17/23 110/76   Wt Readings from Last 3 Encounters:  10/21/23 230 lb (104.3 kg)  10/13/23 224 lb (101.6 kg)  09/17/23 226 lb 6.4 oz (102.7 kg)    Physical Exam Constitutional:      General: She is not in acute distress.    Appearance: Normal appearance.  HENT:     Head: Normocephalic.  Cardiovascular:     Rate and Rhythm: Normal rate and regular rhythm.     Heart sounds: Normal heart sounds.  Pulmonary:     Effort: Pulmonary effort is normal.     Breath sounds: Normal breath sounds.  Skin:    General: Skin is warm and dry.  Neurological:     General: No focal deficit present.     Mental Status: She is alert.  Psychiatric:        Mood and Affect: Mood normal.        Behavior: Behavior normal.      Assessment/Plan: Please see individual problem list.  Type 2 diabetes mellitus without complication, without long-term current use of insulin (HCC) Assessment &  Plan: A1c is elevated at 7.2. Initiate Mounjaro  injection at 2.5 mg weekly for four weeks, then increase to 5 mg weekly. It is expected to improve A1c and assist with weight loss. Counseled on the risk of pancreatitis and gallbladder disease. Discussed the risk of nausea. Advised to discontinue the Mounjaro  and contact us  immediately if they develop abdominal pain. If they develop excessive nausea they will contact us  right away. I discussed that medullary thyroid  cancer has been seen in rats studies. The patient confirmed no personal or family history of thyroid  cancer, parathyroid cancer, or adrenal gland  cancer. Discussed that we thus far have not seen medullary thyroid  cancer result from use of this type of medication in humans. Advised to monitor the thyroid  area and contact us  for any lumps, swelling, trouble swallowing, or any other changes in this area.  Discussed the importance of healthy diet, exercise and lifestyle modifications even with this medication. Follow up in six weeks to assess response and adjust dosage.  Orders: -     Tirzepatide ; Inject 2.5 mg into the skin once a week. X 4 weeks then increase to 5 mg (Patient not taking: Reported on 10/21/2023)  Dispense: 2 mL; Refill: 0 -     Tirzepatide ; Inject 5 mg into the skin once a week. (Patient not taking: Reported on 10/21/2023)  Dispense: 2 mL; Refill: 0  Hyperlipidemia LDL goal <130 Assessment & Plan: Cholesterol levels are slightly elevated, and she is not on medication. Plan to monitor cholesterol levels with weight loss efforts.    POTS (postural orthostatic tachycardia syndrome) Assessment & Plan: Hydration is crucial as Mounjaro  may decrease thirst, risking dehydration and exacerbating POTS. Ensure adequate fluid intake to manage POTS.      Return in about 6 weeks (around 11/24/2023) for Follow up.   Margaret Glance, NP-C [facility]

## 2023-10-14 ENCOUNTER — Other Ambulatory Visit (HOSPITAL_COMMUNITY): Payer: Self-pay

## 2023-10-14 ENCOUNTER — Telehealth: Payer: Self-pay

## 2023-10-14 NOTE — Telephone Encounter (Signed)
 PA needed for [facility].

## 2023-10-14 NOTE — Telephone Encounter (Signed)
 PA request has been Approved. New Encounter has been or will be created for follow up. For additional info see Pharmacy Prior Auth telephone encounter from 10/14/2023.

## 2023-10-14 NOTE — Telephone Encounter (Signed)
 Pharmacy Patient Advocate Encounter   Received notification from Physician's Office that prior authorization for Mounjaro  2.5MG /0.5ML auto-injectors is required/requested.   Insurance verification completed.   The patient is insured through [facility] .   Per test claim: PA required; PA submitted to above mentioned insurance via Latent Key/confirmation #/EOC [id number] Status is pending

## 2023-10-14 NOTE — Telephone Encounter (Signed)
 Pharmacy Patient Advocate Encounter  Received notification from [employer] that Prior Authorization for Mounjaro  2.5MG /0.5ML auto-injectors has been APPROVED from 10/14/2023 to 10/13/2024. Ran test claim, Copay is $25. This test claim was processed through [facility]- copay amounts may vary at other pharmacies due to pharmacy/plan contracts, or as the patient moves through the different stages of their insurance plan.   PA #/Case ID/Reference #: [id number]

## 2023-10-21 ENCOUNTER — Ambulatory Visit: Payer: Managed Care, Other (non HMO) | Admitting: Dermatology

## 2023-10-21 ENCOUNTER — Encounter: Payer: Self-pay | Admitting: Obstetrics & Gynecology

## 2023-10-21 ENCOUNTER — Encounter: Payer: Self-pay | Admitting: Nurse Practitioner

## 2023-10-21 ENCOUNTER — Other Ambulatory Visit: Payer: Self-pay

## 2023-10-21 ENCOUNTER — Ambulatory Visit: Admitting: Obstetrics & Gynecology

## 2023-10-21 VITALS — BP 128/86 | HR 108 | Ht 64.0 in | Wt 230.0 lb

## 2023-10-21 DIAGNOSIS — Z1231 Encounter for screening mammogram for malignant neoplasm of breast: Secondary | ICD-10-CM | POA: Diagnosis not present

## 2023-10-21 DIAGNOSIS — Z01419 Encounter for gynecological examination (general) (routine) without abnormal findings: Secondary | ICD-10-CM

## 2023-10-21 NOTE — Assessment & Plan Note (Signed)
 A1c is elevated at 7.2. Initiate Mounjaro  injection at 2.5 mg weekly for four weeks, then increase to 5 mg weekly. It is expected to improve A1c and assist with weight loss. Counseled on the risk of pancreatitis and gallbladder disease. Discussed the risk of nausea. Advised to discontinue the Mounjaro  and contact us  immediately if they develop abdominal pain. If they develop excessive nausea they will contact us  right away. I discussed that medullary thyroid  cancer has been seen in rats studies. The patient confirmed no personal or family history of thyroid  cancer, parathyroid cancer, or adrenal gland cancer. Discussed that we thus far have not seen medullary thyroid  cancer result from use of this type of medication in humans. Advised to monitor the thyroid  area and contact us  for any lumps, swelling, trouble swallowing, or any other changes in this area.  Discussed the importance of healthy diet, exercise and lifestyle modifications even with this medication. Follow up in six weeks to assess response and adjust dosage.

## 2023-10-21 NOTE — Assessment & Plan Note (Signed)
 Hydration is crucial as Mounjaro  may decrease thirst, risking dehydration and exacerbating POTS. Ensure adequate fluid intake to manage POTS.

## 2023-10-21 NOTE — Progress Notes (Signed)
 GYNECOLOGY ANNUAL PREVENTATIVE CARE ENCOUNTER NOTE  History:    Margaret Silva is a 43 y.o. G0P0000 female here for a routine annual gynecologic exam.  Current complaints: none.   Denies abnormal vaginal bleeding, discharge, pelvic pain, problems with intercourse or other gynecologic concerns.  Gynecologic History No LMP recorded. Patient has had an injection. Contraception: Depo-Provera  injections Last Pap: 06/19/2023. Result was normal with negative HPV Last Mammogram: 12/15/2022.  Result was normal  Obstetric History OB History  Gravida Para Term Preterm AB Living  0 0 0 0 0 0  SAB IAB Ectopic Multiple Live Births  0 0 0 0 0    Past Medical History:  Diagnosis Date   Asthma    cough variant   Bursitis of left hip    Diverticulitis    h/o   Dysplastic nevus 09/03/2021   right lateral knee, moderate   Endometriosis    Fibromyalgia    GERD (gastroesophageal reflux disease)    Headache    History of chicken pox    History of colonoscopy 11/04 and April 06   History of laparoscopy 12/04 and 12/07   History of laparoscopy 01/29/2006   Dr. Adaline Endometriosis   History of trichomoniasis 09/01/2018   Hx of migraines    Hx: UTI (urinary tract infection)    Interstitial cystitis 01/03/2004   hydrodistilation   Jaundice    h/o   Plantar fasciitis, bilateral    PONV (postoperative nausea and vomiting)    Pott's disease    Not official but working on getting the diagnosis    Past Surgical History:  Procedure Laterality Date   CYSTOSCOPY WITH HYDRODISTENSION AND BIOPSY     DIAGNOSTIC LAPAROSCOPY  02/16/2003   ESOPHAGOGASTRODUODENOSCOPY (EGD) WITH PROPOFOL  N/A 09/30/2018   Procedure: ESOPHAGOGASTRODUODENOSCOPY (EGD) WITH PROPOFOL ;  Surgeon: Janalyn Keene NOVAK, MD;  Location: [facility] ENDOSCOPY;  Service: Endoscopy;  Laterality: N/A;   ESOPHAGOGASTRODUODENOSCOPY (EGD) WITH PROPOFOL  N/A 02/03/2022   Procedure: ESOPHAGOGASTRODUODENOSCOPY (EGD) WITH PROPOFOL ;   Surgeon: Therisa Bi, MD;  Location: [facility] ENDOSCOPY;  Service: Gastroenterology;  Laterality: N/A;   SIGMOIDOSCOPY  05/26/10    Current Outpatient Medications on File Prior to Visit  Medication Sig Dispense Refill   albuterol  (VENTOLIN  HFA) 108 (90 Base) MCG/ACT inhaler Inhale 2 puffs into the lungs every 4 (four) hours as needed for wheezing or shortness of breath. 18 each 0   amitriptyline  (ELAVIL ) 50 MG tablet TAKE 1 TABLET(50 MG) BY MOUTH AT BEDTIME 90 tablet 0   Atogepant  (QULIPTA ) 60 MG TABS Take 1 tablet (60 mg total) by mouth daily. 30 tablet 5   Cholecalciferol (VITAMIN D  PO) Take 5,000 Int'l Units by mouth once a week.     Clindamycin -Benzoyl Per, Refr, gel APPLY TOPICALLY TO THE AFFECTED AREA EVERY DAY 45 g 2   cyclobenzaprine  (FLEXERIL ) 10 MG tablet Take 1 tablet (10 mg total) by mouth at bedtime. 90 tablet 3   diclofenac  (CATAFLAM ) 50 MG tablet Take 50 mg by mouth daily.     diclofenac  sodium (VOLTAREN) 1 % GEL Apply 4 g topically 3 (three) times daily as needed.     divalproex  (DEPAKOTE ) 500 MG DR tablet TAKE 1 TABLET(500 MG) BY MOUTH EVERY DAY 90 tablet 1   DULoxetine  (CYMBALTA ) 30 MG capsule Take 3 capsules (90 mg total) by mouth daily. 270 capsule 1   EPINEPHrine 0.3 mg/0.3 mL IJ SOAJ injection Inject into the muscle.     fluticasone  (FLONASE ) 50 MCG/ACT nasal spray Place 2 sprays  into both nostrils daily. 16 g 6   gentamicin cream (GARAMYCIN) 0.1 % Apply 1 application topically as needed (for nose).     ketoconazole  (NIZORAL ) 2 % cream APPLY TO CREASE OF NOSE TWICE DAILY AS NEEDED FOR FLAKE AND ITCH 60 g 1   loratadine  (CLARITIN ) 10 MG tablet TAKE 1 TABLET(10 MG) BY MOUTH DAILY 90 tablet 3   medroxyPROGESTERone  (DEPO-PROVERA ) 150 MG/ML injection Inject 1 mL (150 mg total) into the muscle every 3 (three) months. 1 mL 4   methylPREDNISolone  Acetate (DEPO-MEDROL  IJ) Inject as directed every 3 (three) months. First injection June 2023     Naltrexone -buPROPion  HCl ER 8-90 MG TB12  Start 1 tablet every morning for 7 days, then 1 tablet twice daily for 7 days, then 2 tablets every morning and one in the evening 120 tablet 3   omeprazole  (PRILOSEC) 20 MG capsule Take 1 capsule (20 mg total) by mouth daily. 90 capsule 3   ondansetron  (ZOFRAN ) 4 MG tablet Take 1 tablet (4 mg total) by mouth every 6 (six) hours. 30 tablet 11   SUMAtriptan  (IMITREX ) 100 MG tablet Take 1 tablet (100 mg total) by mouth as needed for migraine. May repeat in 2 hours if headache persists or recurs. 10 tablet 11   Ubrogepant  (UBRELVY ) 100 MG TABS Take 1 tablet (100 mg total) by mouth every 2 (two) hours as needed. Maximum 200mg  a day. 16 tablet 11   valACYclovir  (VALTREX ) 500 MG tablet Take 1 tablet (500 mg total) by mouth daily. 30 tablet 5   zolpidem  (AMBIEN  CR) 6.25 MG CR tablet Take 1 tablet (6.25 mg total) by mouth at bedtime as needed for sleep. 30 tablet 5   tirzepatide  (MOUNJARO ) 2.5 MG/0.5ML Pen Inject 2.5 mg into the skin once a week. X 4 weeks then increase to 5 mg (Patient not taking: Reported on 10/21/2023) 2 mL 0   tirzepatide  (MOUNJARO ) 5 MG/0.5ML Pen Inject 5 mg into the skin once a week. (Patient not taking: Reported on 10/21/2023) 2 mL 0   Current Facility-Administered Medications on File Prior to Visit  Medication Dose Route Frequency Provider Last Rate Last Admin   medroxyPROGESTERone  (DEPO-PROVERA ) injection 150 mg  150 mg Intramuscular Q90 days Eldonna Suzen Octave, MD   150 mg at 12/15/21 1621   medroxyPROGESTERone  (DEPO-PROVERA ) injection 150 mg  150 mg Intramuscular Q90 days Naven Giambalvo A, MD   150 mg at 03/10/22 1605   medroxyPROGESTERone  Acetate SUSY 150 mg  150 mg Intramuscular Q90 days Izell Harari, MD   150 mg at 08/11/22 1413    Allergies  Allergen Reactions   Coffee Bean Extract  [Coffea Arabica] Nausea Only   Other     Outdoor allergies    Trazodone And Nefazodone Other (See Comments)    irritability    Social History:  reports that she has never smoked. She  has been exposed to tobacco smoke. She has never used smokeless tobacco. She reports current alcohol use. She reports that she does not use drugs.  Family History  Problem Relation Age of Onset   Fibromyalgia Mother    Migraines Mother    Cancer Mother 12       breast   Breast cancer Mother 53   Hypertension Father    Hyperlipidemia Father    Prostate cancer Father    Ehlers-Danlos syndrome Sister    Fibromyalgia Sister    Thyroid  cancer Sister    Diabetes Maternal Grandmother    Arthritis Maternal Grandmother  Kidney disease Maternal Grandmother    Heart attack Maternal Grandfather    Heart disease Maternal Grandfather    Hypertension Maternal Grandfather    Mental illness Maternal Grandfather    Cancer Paternal Grandmother        Liver cancer/lung cancer   Diabetes Paternal Grandmother    Heart attack Paternal Grandfather    Cancer Paternal Grandfather        Pancreatic/ Bladder Cancer/prostate   Arthritis Paternal Grandfather    Heart disease Paternal Grandfather    Ovarian cancer Neg Hx    Colon cancer Neg Hx     The following portions of the patient's history were reviewed and updated as appropriate: allergies, current medications, past family history, past medical history, past social history, past surgical history and problem list.  Review of Systems Pertinent items noted in HPI and remainder of comprehensive ROS otherwise negative.  Physical Exam:  BP 128/86   Pulse (!) 108   Ht 5' 4 (1.626 m)   Wt 230 lb (104.3 kg)   BMI 39.48 kg/m  CONSTITUTIONAL: Well-developed, well-nourished female in no acute distress.  HENT:  Normocephalic, atraumatic, External right and left ear normal. Oropharynx is clear and moist EYES: Conjunctivae and EOM are normal. Pupils are equal, round, and reactive to light. No scleral icterus.  NECK: Normal range of motion, supple, no masses observed. SKIN: Skin is warm and dry. No rash noted. Not diaphoretic. No erythema. No  pallor. MUSCULOSKELETAL: Normal range of motion. No tenderness.  No cyanosis, clubbing, or edema.  2+ distal pulses. NEUROLOGIC: Alert and oriented to person, place, and time. Normal muscle tone coordination.  PSYCHIATRIC: Normal mood and affect. Normal behavior. Normal judgment and thought content. CARDIOVASCULAR: Normal heart rate noted, regular rhythm RESPIRATORY: Clear to auscultation bilaterally. Effort and breath sounds normal, no problems with respiration noted. BREASTS: Symmetric in size. No masses, tenderness, skin changes, nipple drainage, or lymphadenopathy bilaterally.  Performed in the presence of a chaperone. ABDOMEN: Soft, obese, no distention appreciated.  No tenderness, rebound or guarding.  PELVIC: Normal appearing external genitalia and urethral meatus; normal appearing vaginal mucosa and cervix.  Normal appearing discharge.  Pap smear obtained.  Unable to palpate uterus or adnexa secondary to habitus.  Performed in the presence of a chaperone.  Assessment and Plan:     1. Breast cancer screening by mammogram Mammogram scheduled for breast cancer screening. - MM 3D SCREENING MAMMOGRAM BILATERAL BREAST; Future  2. Well woman exam with routine gynecological exam (Primary) - IGP, Aptima HPV, rfx 16/18,45 Will follow up results of pap smear and manage accordingly.  Routine preventative health maintenance measures emphasized. Please refer to After Visit Summary for other counseling recommendations.      GLORIS HUGGER, MD, FACOG Obstetrician & Gynecologist, [facility] for [employer], [facility]

## 2023-10-21 NOTE — Assessment & Plan Note (Signed)
 Cholesterol levels are slightly elevated, and she is not on medication. Plan to monitor cholesterol levels with weight loss efforts.

## 2023-10-24 LAB — IGP, APTIMA HPV, RFX 16/18,45
HPV Aptima: NEGATIVE
PAP Smear Comment: 0

## 2023-10-25 ENCOUNTER — Ambulatory Visit: Payer: Self-pay | Admitting: Obstetrics & Gynecology

## 2023-11-02 ENCOUNTER — Telehealth: Admitting: Physician Assistant

## 2023-11-02 DIAGNOSIS — B3731 Acute candidiasis of vulva and vagina: Secondary | ICD-10-CM

## 2023-11-02 MED ORDER — FLUCONAZOLE 150 MG PO TABS: 150.0000 mg | ORAL_TABLET | ORAL | 0 refills | Status: DC | PRN

## 2023-11-02 NOTE — Progress Notes (Signed)

## 2023-11-24 ENCOUNTER — Other Ambulatory Visit: Payer: Self-pay

## 2023-11-24 ENCOUNTER — Telehealth: Payer: Self-pay | Admitting: Nurse Practitioner

## 2023-11-24 ENCOUNTER — Ambulatory Visit: Admitting: Nurse Practitioner

## 2023-11-24 DIAGNOSIS — G43009 Migraine without aura, not intractable, without status migrainosus: Secondary | ICD-10-CM

## 2023-11-24 MED ORDER — AMITRIPTYLINE HCL 50 MG PO TABS: ORAL_TABLET | ORAL | 1 refills | Status: AC

## 2023-11-24 NOTE — Telephone Encounter (Signed)
 Lm and sent MyChart message to call office and reschedule appt.  E2C2 please reschedule appt

## 2023-11-24 NOTE — Telephone Encounter (Signed)
 Last filled by patient on 08/25/23 Last office visit : 04/29/23 Next office visit : Patient is due back in 12 months 04/28/24 Per last ov note: continue amitriptyline  50 mg nightly

## 2023-12-08 ENCOUNTER — Other Ambulatory Visit: Payer: Self-pay | Admitting: Obstetrics and Gynecology

## 2023-12-08 ENCOUNTER — Ambulatory Visit

## 2023-12-08 ENCOUNTER — Other Ambulatory Visit: Payer: Self-pay

## 2023-12-08 DIAGNOSIS — Z3042 Encounter for surveillance of injectable contraceptive: Secondary | ICD-10-CM

## 2023-12-08 MED ORDER — MEDROXYPROGESTERONE ACETATE 150 MG/ML IM SUSP: 150.0000 mg | INTRAMUSCULAR | 3 refills | Status: AC

## 2023-12-08 NOTE — Progress Notes (Signed)
 Date last pap: 10/21/2023. Last Depo-Provera : 09/22/23. Side Effects if any: None. Serum HCG indicated? N/A. Depo-Provera  150 mg IM given by: Toya L CMA . Next appointment due 01/07-01/21.

## 2023-12-08 NOTE — Progress Notes (Signed)
 Pt called stating she needed refill on depo. Rx sent with 3 refills.

## 2023-12-14 ENCOUNTER — Ambulatory Visit
Admission: RE | Admit: 2023-12-14 | Discharge: 2023-12-14 | Disposition: A | Discharge status: Home / Self Care | Source: Ambulatory Visit | Attending: Obstetrics & Gynecology | Admitting: Obstetrics & Gynecology

## 2023-12-14 DIAGNOSIS — Z1231 Encounter for screening mammogram for malignant neoplasm of breast: Secondary | ICD-10-CM | POA: Diagnosis present

## 2023-12-15 ENCOUNTER — Encounter: Payer: Self-pay | Admitting: Nurse Practitioner

## 2023-12-18 ENCOUNTER — Other Ambulatory Visit: Payer: Self-pay | Admitting: Nurse Practitioner

## 2023-12-18 DIAGNOSIS — E119 Type 2 diabetes mellitus without complications: Secondary | ICD-10-CM

## 2023-12-23 ENCOUNTER — Encounter: Payer: Self-pay | Admitting: Nurse Practitioner

## 2023-12-23 ENCOUNTER — Ambulatory Visit: Admitting: Nurse Practitioner

## 2023-12-23 VITALS — BP 108/70 | HR 93 | Temp 98.2°F | Ht 64.0 in | Wt 212.0 lb

## 2023-12-23 DIAGNOSIS — Z7985 Long-term (current) use of injectable non-insulin antidiabetic drugs: Secondary | ICD-10-CM | POA: Diagnosis not present

## 2023-12-23 DIAGNOSIS — E119 Type 2 diabetes mellitus without complications: Secondary | ICD-10-CM | POA: Diagnosis not present

## 2023-12-23 LAB — POCT GLYCOSYLATED HEMOGLOBIN (HGB A1C)
HbA1c POC (<> result, manual entry): 6.4 % (ref 4.0–5.6)
HbA1c, POC (controlled diabetic range): 6.4 % (ref 0.0–7.0)
HbA1c, POC (prediabetic range): 6.4 % — AB (ref 5.7–6.4)
Hemoglobin A1C: 6.4 % — AB (ref 4.0–5.6)

## 2023-12-23 MED ORDER — TIRZEPATIDE 5 MG/0.5ML ~~LOC~~ SOAJ: 5.0000 mg | SUBCUTANEOUS | 0 refills | Status: DC

## 2023-12-23 NOTE — Assessment & Plan Note (Signed)
 A1c improved to 6.4% with Mounjaro  5 mg weekly. She experienced nausea but no significant side effects. Weight loss of 18 pounds achieved. One episode of hypoglycemia occurred but resolved. Continue Mounjaro  5 mg weekly with refills ensured. Follow-up in three months to monitor A1c and adjust treatment as needed. Encourage continued healthy diet and regular exercise.

## 2023-12-23 NOTE — Progress Notes (Signed)
 Leron Glance, NP-C Phone: [phone]  Margaret Silva is a 43 y.o. female who presents today for follow up.   Discussed the use of AI scribe software for clinical note transcription with the patient, who gave verbal consent to proceed.  History of Present Illness   Margaret Silva is a 43 year old female with type 2 diabetes who presents for follow-up after starting Mounjaro .  She started Mounjaro  three months ago after her A1c was 7.2%. Initially, she was on a dose of 2.5 mg, which was increased to 5 mg. She experiences significant nausea after her last injection, which she took on a Thursday, with symptoms persisting into Friday and Saturday. Despite this, she has lost 18 pounds over the past two months.  She is not currently checking her blood sugar levels at home. No excessive thirst or urination, attributing her dry mouth to other medications. She recalls one episode of feeling unwell while going to dinner, which she managed by consuming [facility] and food. Her appetite has decreased, and she finds it easier to maintain a healthier diet, having cut out sugary foods like cereals.  Her current medication regimen includes Mounjaro  5 mg, which she administers weekly on Thursdays. She has not taken her dose this week yet, as she is due for it today.  Her grandmother had diabetes, which has made her familiar with the symptoms of hypoglycemia.      Social History   Tobacco Use  Smoking Status Never   Passive exposure: Past  Smokeless Tobacco Never    Current Outpatient Medications on File Prior to Visit  Medication Sig Dispense Refill   albuterol  (VENTOLIN  HFA) 108 (90 Base) MCG/ACT inhaler Inhale 2 puffs into the lungs every 4 (four) hours as needed for wheezing or shortness of breath. 18 each 0   amitriptyline  (ELAVIL ) 50 MG tablet TAKE 1 TABLET(50 MG) BY MOUTH AT BEDTIME 90 tablet 1   Atogepant  (QULIPTA ) 60 MG TABS Take 1 tablet (60 mg total) by mouth daily. 30 tablet  5   Cholecalciferol (VITAMIN D  PO) Take 5,000 Int'l Units by mouth once a week.     Clindamycin -Benzoyl Per, Refr, gel APPLY TOPICALLY TO THE AFFECTED AREA EVERY DAY 45 g 2   cyclobenzaprine  (FLEXERIL ) 10 MG tablet Take 1 tablet (10 mg total) by mouth at bedtime. 90 tablet 3   diclofenac  (CATAFLAM ) 50 MG tablet Take 50 mg by mouth daily.     diclofenac  sodium (VOLTAREN) 1 % GEL Apply 4 g topically 3 (three) times daily as needed.     divalproex  (DEPAKOTE ) 500 MG DR tablet TAKE 1 TABLET(500 MG) BY MOUTH EVERY DAY 90 tablet 1   DULoxetine  (CYMBALTA ) 30 MG capsule Take 3 capsules (90 mg total) by mouth daily. 270 capsule 1   EPINEPHrine 0.3 mg/0.3 mL IJ SOAJ injection Inject into the muscle.     fluticasone  (FLONASE ) 50 MCG/ACT nasal spray Place 2 sprays into both nostrils daily. 16 g 6   gentamicin cream (GARAMYCIN) 0.1 % Apply 1 application topically as needed (for nose).     ketoconazole  (NIZORAL ) 2 % cream APPLY TO CREASE OF NOSE TWICE DAILY AS NEEDED FOR FLAKE AND ITCH 60 g 1   loratadine  (CLARITIN ) 10 MG tablet TAKE 1 TABLET(10 MG) BY MOUTH DAILY 90 tablet 3   medroxyPROGESTERone  (DEPO-PROVERA ) 150 MG/ML injection Inject 1 mL (150 mg total) into the muscle every 3 (three) months. 1 mL 3   omeprazole  (PRILOSEC) 20 MG capsule Take 1 capsule (20  mg total) by mouth daily. 90 capsule 3   ondansetron  (ZOFRAN ) 4 MG tablet Take 1 tablet (4 mg total) by mouth every 6 (six) hours. 30 tablet 11   SUMAtriptan  (IMITREX ) 100 MG tablet Take 1 tablet (100 mg total) by mouth as needed for migraine. May repeat in 2 hours if headache persists or recurs. 10 tablet 11   Ubrogepant  (UBRELVY ) 100 MG TABS Take 1 tablet (100 mg total) by mouth every 2 (two) hours as needed. Maximum 200mg  a day. 16 tablet 11   valACYclovir  (VALTREX ) 500 MG tablet Take 1 tablet (500 mg total) by mouth daily. 30 tablet 5   zolpidem  (AMBIEN  CR) 6.25 MG CR tablet Take 1 tablet (6.25 mg total) by mouth at bedtime as needed for sleep. 30  tablet 5   Current Facility-Administered Medications on File Prior to Visit  Medication Dose Route Frequency Provider Last Rate Last Admin   medroxyPROGESTERone  (DEPO-PROVERA ) injection 150 mg  150 mg Intramuscular Q90 days Eldonna Suzen Octave, MD   150 mg at 12/15/21 1621   medroxyPROGESTERone  (DEPO-PROVERA ) injection 150 mg  150 mg Intramuscular Q90 days Anyanwu, Ugonna A, MD   150 mg at 03/10/22 1605   medroxyPROGESTERone  Acetate SUSY 150 mg  150 mg Intramuscular Q90 days Izell Harari, MD   150 mg at 12/08/23 1345     ROS see history of present illness  Objective  Physical Exam Vitals:   12/23/23 1504  BP: 108/70  Pulse: 93  Temp: 98.2 F (36.8 C)  SpO2: 98%    BP Readings from Last 3 Encounters:  12/23/23 108/70  10/21/23 128/86  10/13/23 128/86   Wt Readings from Last 3 Encounters:  12/23/23 212 lb (96.2 kg)  10/21/23 230 lb (104.3 kg)  10/13/23 224 lb (101.6 kg)    Physical Exam Constitutional:      General: She is not in acute distress.    Appearance: Normal appearance.  HENT:     Head: Normocephalic.  Cardiovascular:     Rate and Rhythm: Normal rate and regular rhythm.     Heart sounds: Normal heart sounds.  Pulmonary:     Effort: Pulmonary effort is normal.     Breath sounds: Normal breath sounds.  Skin:    General: Skin is warm and dry.  Neurological:     General: No focal deficit present.     Mental Status: She is alert.  Psychiatric:        Mood and Affect: Mood normal.        Behavior: Behavior normal.      Assessment/Plan: Please see individual problem list.  Type 2 diabetes mellitus without complication, without long-term current use of insulin (HCC) Assessment & Plan: A1c improved to 6.4% with Mounjaro  5 mg weekly. She experienced nausea but no significant side effects. Weight loss of 18 pounds achieved. One episode of hypoglycemia occurred but resolved. Continue Mounjaro  5 mg weekly with refills ensured. Follow-up in three months  to monitor A1c and adjust treatment as needed. Encourage continued healthy diet and regular exercise.   Orders: -     POCT glycosylated hemoglobin (Hb A1C) -     Tirzepatide ; Inject 5 mg into the skin once a week.  Dispense: 6 mL; Refill: 0    Return in about 3 months (around 03/24/2024) for Follow up.   Leron Glance, NP-C [facility]

## 2023-12-24 ENCOUNTER — Telehealth: Payer: Self-pay | Admitting: Pharmacist

## 2023-12-24 NOTE — Telephone Encounter (Signed)
 Pharmacy Patient Advocate Encounter   Received notification from CoverMyMeds that prior authorization for Qulipta  60MG  tablets is required/requested.   Insurance verification completed.   The patient is insured through [facility].   Per test claim: PA required; PA submitted to above mentioned insurance via Latent Key/confirmation #/EOC [id number] Status is pending

## 2023-12-24 NOTE — Telephone Encounter (Signed)
 Pharmacy Patient Advocate Encounter  Received notification from [facility] that Prior Authorization for QULIPTA  60 MG PO TABS has been APPROVED from 12/23/2024 to 12/23/2025   PA #/Case ID/Reference #: [id number]

## 2023-12-29 ENCOUNTER — Ambulatory Visit (INDEPENDENT_AMBULATORY_CARE_PROVIDER_SITE_OTHER): Admitting: Internal Medicine

## 2023-12-29 ENCOUNTER — Encounter: Payer: Self-pay | Admitting: Internal Medicine

## 2023-12-29 VITALS — BP 120/80 | HR 99 | Temp 98.8°F | Ht 64.0 in | Wt 217.2 lb

## 2023-12-29 DIAGNOSIS — K219 Gastro-esophageal reflux disease without esophagitis: Secondary | ICD-10-CM | POA: Diagnosis not present

## 2023-12-29 DIAGNOSIS — R053 Chronic cough: Secondary | ICD-10-CM

## 2023-12-29 DIAGNOSIS — E669 Obesity, unspecified: Secondary | ICD-10-CM

## 2023-12-29 DIAGNOSIS — J45991 Cough variant asthma: Secondary | ICD-10-CM

## 2023-12-29 NOTE — Progress Notes (Signed)
 [facility] Pulmonary Medicine Consultation      Date: 12/29/2023,   MRN# [id number] EDILIA GHUMAN 04-23-1980  Tests HST 06/2022 AHI less than 5 no evidence of sleep apnea  Chest x-ray 05/2022 No acute process no active disease  PFTs 08/2022 No acute process No obstruction No restriction PFTs within normal limits  SYNOPSIS  43 year old pleasant white female seen today for chronic cough for 4 years Patient stated it started on a mission trip 4 years ago when she was exposed to termites Several years later in 2022 patient was diagnosed with COVID-19 infection Patient is an smoker  Patient has been diagnosed with childhood asthma Triggers include tree pollen exercise   All test previously prescribed HST, PFT, and chest x-ray are within normal limits CT of the chest reviewed in detail within normal limits no acute process Patient weaned off of Symbicort  Patient's cough seems to have improved 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 Milissa He recommends saline sprays 6 times a day per patient Patient does not take any type of intranasal steroids at this time   GI history 02/03/2022 : EGD: 2 non bleeding gastric ulcers. Esophagus was normal but empirically was dilated to 18 F.Biopsies showed no abnormalities. Gastric bx show reactive gastropathy .    H pylori breath test negative.  prilosec 2 times a day before meals, adhering to life style changes for gerd     CHIEF COMPLAINT:  Diagnosed of cough variant asthma  HISTORY OF PRESENT ILLNESS   Current weight 212 Recommend weight loss Patient diagnosed with diabetes on Mounjaro   The workup for her cough has not been revealing at this time suggesting a possible cough variant asthma related  to underlying anxiety and stressful job with exposure to some irritants  CT chest does NOT reveal any significant lung abnormalities Will use albuterol  only as needed  No exacerbation at this time No evidence of heart failure at this time No evidence or signs of infection at this time No respiratory distress No fevers, chills, nausea, vomiting, diarrhea No evidence of lower extremity edema No evidence hemoptysis     PAST MEDICAL HISTORY   Past Medical History:  Diagnosis Date   Asthma    cough variant   Bursitis of left hip    Diverticulitis    h/o   Dysplastic nevus 09/03/2021   right lateral knee, moderate   Endometriosis    Fibromyalgia    GERD (gastroesophageal reflux disease)    Headache    History of chicken pox    History of colonoscopy 11/04 and April 06   History of laparoscopy 12/04 and 12/07   History of laparoscopy 01/29/2006   Dr. Adaline Endometriosis   History of trichomoniasis 09/01/2018   Hx of migraines    Hx: UTI (urinary tract infection)    Interstitial cystitis 01/03/2004   hydrodistilation   Jaundice    h/o   Plantar fasciitis, bilateral    PONV (postoperative nausea and vomiting)    Pott's disease    Not official but working on getting the diagnosis     SURGICAL HISTORY   Past Surgical History:  Procedure Laterality Date   CYSTOSCOPY WITH HYDRODISTENSION AND BIOPSY     DIAGNOSTIC LAPAROSCOPY  02/16/2003  ESOPHAGOGASTRODUODENOSCOPY (EGD) WITH PROPOFOL  N/A 09/30/2018   Procedure: ESOPHAGOGASTRODUODENOSCOPY (EGD) WITH PROPOFOL ;  Surgeon: Janalyn Keene NOVAK, MD;  Location: [facility] ENDOSCOPY;  Service: Endoscopy;  Laterality: N/A;   ESOPHAGOGASTRODUODENOSCOPY (EGD) WITH PROPOFOL  N/A 02/03/2022   Procedure: ESOPHAGOGASTRODUODENOSCOPY (EGD) WITH PROPOFOL ;  Surgeon: Therisa Bi, MD;  Location: [facility] ENDOSCOPY;  Service: Gastroenterology;  Laterality: N/A;   SIGMOIDOSCOPY  05/26/10     FAMILY HISTORY   Family History  Problem  Relation Age of Onset   Fibromyalgia Mother    Migraines Mother    Cancer Mother 51       breast   Breast cancer Mother 14   Hypertension Father    Hyperlipidemia Father    Prostate cancer Father    Ehlers-Danlos syndrome Sister    Fibromyalgia Sister    Thyroid  cancer Sister    Diabetes Maternal Grandmother    Arthritis Maternal Grandmother    Kidney disease Maternal Grandmother    Heart attack Maternal Grandfather    Heart disease Maternal Grandfather    Hypertension Maternal Grandfather    Mental illness Maternal Grandfather    Cancer Paternal Grandmother        Liver cancer/lung cancer   Diabetes Paternal Grandmother    Heart attack Paternal Grandfather    Cancer Paternal Grandfather        Pancreatic/ Bladder Cancer/prostate   Arthritis Paternal Grandfather    Heart disease Paternal Grandfather    Ovarian cancer Neg Hx    Colon cancer Neg Hx      SOCIAL HISTORY   Social History   Tobacco Use   Smoking status: Never    Passive exposure: Past   Smokeless tobacco: Never  Vaping Use   Vaping status: Never Used  Substance Use Topics   Alcohol use: Yes    Comment: rarely   Drug use: No     MEDICATIONS    Home Medication:  Current Outpatient Rx   Order #: [id number] Class: Normal   Order #: [id number] Class: Normal   Order #: [id number] Class: Normal   Order #: [id number] Class: Historical Med   Order #: [id number] Class: Normal   Order #: [id number] Class: Normal   Order #: [id number] Class: Historical Med   Order #: [id number] Class: Historical Med   Order #: [id number] Class: Normal   Order #: [id number] Class: Normal   Order #: [id number] Class: Historical Med   Order #: [id number] Class: Normal   Order #: [id number] Class: Historical Med   Order #: [id number] Class: Normal   Order #: [id number] Class: Normal   Order #: [id number] Class: Normal   Order #: [id number] Class: Normal   Order #: [id number] Class: Normal   Order #: [id number] Class: Normal   Order #: [id number] Class:  Normal   Order #: [id number] Class: Normal   Order #: [id number] Class: Normal   Order #: [id number] Class: Normal    Current Medication:  Current Outpatient Medications:    albuterol  (VENTOLIN  HFA) 108 (90 Base) MCG/ACT inhaler, Inhale 2 puffs into the lungs every 4 (four) hours as needed for wheezing or shortness of breath., Disp: 18 each, Rfl: 0   amitriptyline  (ELAVIL ) 50 MG tablet, TAKE 1 TABLET(50 MG) BY MOUTH AT BEDTIME, Disp: 90 tablet, Rfl: 1   Atogepant  (QULIPTA ) 60 MG TABS, Take 1 tablet (60 mg total) by mouth daily., Disp: 30 tablet, Rfl: 5   Cholecalciferol (VITAMIN D  PO), Take 5,000 Int'l Units by mouth once a week., Disp: , Rfl:    Clindamycin -Benzoyl Per, Refr, gel, APPLY TOPICALLY TO THE AFFECTED AREA EVERY DAY, Disp: 45 g, Rfl: 2   cyclobenzaprine  (FLEXERIL )  10 MG tablet, Take 1 tablet (10 mg total) by mouth at bedtime., Disp: 90 tablet, Rfl: 3   diclofenac  (CATAFLAM ) 50 MG tablet, Take 50 mg by mouth daily., Disp: , Rfl:    diclofenac  sodium (VOLTAREN) 1 % GEL, Apply 4 g topically 3 (three) times daily as needed., Disp: , Rfl:    divalproex  (DEPAKOTE ) 500 MG DR tablet, TAKE 1 TABLET(500 MG) BY MOUTH EVERY DAY, Disp: 90 tablet, Rfl: 1   DULoxetine  (CYMBALTA ) 30 MG capsule, Take 3 capsules (90 mg total) by mouth daily., Disp: 270 capsule, Rfl: 1   EPINEPHrine 0.3 mg/0.3 mL IJ SOAJ injection, Inject into the muscle., Disp: , Rfl:    fluticasone  (FLONASE ) 50 MCG/ACT nasal spray, Place 2 sprays into both nostrils daily., Disp: 16 g, Rfl: 6   gentamicin cream (GARAMYCIN) 0.1 %, Apply 1 application topically as needed (for nose)., Disp: , Rfl:    ketoconazole  (NIZORAL ) 2 % cream, APPLY TO CREASE OF NOSE TWICE DAILY AS NEEDED FOR FLAKE AND ITCH, Disp: 60 g, Rfl: 1   loratadine  (CLARITIN ) 10 MG tablet, TAKE 1 TABLET(10 MG) BY MOUTH DAILY, Disp: 90 tablet, Rfl: 3   medroxyPROGESTERone  (DEPO-PROVERA ) 150 MG/ML injection, Inject 1 mL (150 mg total) into the muscle every 3 (three) months.,  Disp: 1 mL, Rfl: 3   omeprazole  (PRILOSEC) 20 MG capsule, Take 1 capsule (20 mg total) by mouth daily., Disp: 90 capsule, Rfl: 3   ondansetron  (ZOFRAN ) 4 MG tablet, Take 1 tablet (4 mg total) by mouth every 6 (six) hours., Disp: 30 tablet, Rfl: 11   SUMAtriptan  (IMITREX ) 100 MG tablet, Take 1 tablet (100 mg total) by mouth as needed for migraine. May repeat in 2 hours if headache persists or recurs., Disp: 10 tablet, Rfl: 11   tirzepatide  (MOUNJARO ) 5 MG/0.5ML Pen, Inject 5 mg into the skin once a week., Disp: 6 mL, Rfl: 0   Ubrogepant  (UBRELVY ) 100 MG TABS, Take 1 tablet (100 mg total) by mouth every 2 (two) hours as needed. Maximum 200mg  a day., Disp: 16 tablet, Rfl: 11   valACYclovir  (VALTREX ) 500 MG tablet, Take 1 tablet (500 mg total) by mouth daily., Disp: 30 tablet, Rfl: 5   zolpidem  (AMBIEN  CR) 6.25 MG CR tablet, Take 1 tablet (6.25 mg total) by mouth at bedtime as needed for sleep., Disp: 30 tablet, Rfl: 5  Current Facility-Administered Medications:    medroxyPROGESTERone  (DEPO-PROVERA ) injection 150 mg, 150 mg, Intramuscular, Q90 days, Eldonna Suzen Octave, MD, 150 mg at 12/15/21 1621   medroxyPROGESTERone  (DEPO-PROVERA ) injection 150 mg, 150 mg, Intramuscular, Q90 days, Anyanwu, Ugonna A, MD, 150 mg at 03/10/22 1605   medroxyPROGESTERone  Acetate SUSY 150 mg, 150 mg, Intramuscular, Q90 days, Izell Harari, MD, 150 mg at 12/08/23 1345    ALLERGIES   Coffee bean extract  [coffea arabica], Other, and Trazodone and nefazodone   BP 120/80   Pulse 99   Temp 98.8 F (37.1 C)   Ht 5' 4 (1.626 m)   Wt 217 lb 3.2 oz (98.5 kg)   LMP  (LMP Unknown)   SpO2 97%   BMI 37.28 kg/m    Physical Examination:  General Appearance: No distress  EYES EOM intact.   NECK Supple, No JVD Pulmonary: normal breath sounds, No wheezing.  CardiovascularNormal S1,S2.  No m/r/g.   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  At this time home sleep study , pulmonary function testing , chest x-ray , CT of the chest does not show any underlying abnormalities   Cough variant asthma May consider uncontrolled reflux along with cough variant asthma as etiology Continue albuterol  as needed   GERD follow-up with GI continue PPI BID Recommend follow-up with GI  Avoid Allergens and Irritants Avoid secondhand smoke Avoid SICK contacts Recommend  Masking  when appropriate Recommend Keep up-to-date with vaccinations  Obesity -recommend significant weight loss -recommend changing diet  Deconditioned state -Recommend increased daily activity and exercise  ENT Follow-up as scheduled     MEDICATION ADJUSTMENTS/LABS AND TESTS ORDERED: Avoid secondhand smoke Avoid SICK contacts Recommend  Masking  when appropriate Recommend Keep up-to-date with vaccinations Continue to use albuterol  inhaler as needed Continue weight loss journey Albuterol  as needed Recommend follow-up with GI    MEDICATION ADJUSTMENTS/LABS AND TESTS ORDERED:    CURRENT MEDICATIONS REVIEWED AT LENGTH WITH PATIENT TODAY   Patient  satisfied with Plan of action and management. All questions answered   Follow up 1 year   I spent a total of 40 minutes dedicated to the care of this patient on the date of this encounter to include pre-visit review of records, face-to-face time with the patient discussing conditions above, post visit ordering of testing, clinical documentation with the electronic health record, making appropriate referrals as documented, and communicating necessary information to the patient's healthcare team.    The Patient requires high complexity decision making for assessment and support, frequent evaluation and titration of therapies, application of advanced monitoring technologies and extensive  interpretation of multiple databases.  Patient satisfied with Plan of action and management. All questions answered    Nickolas Alm Cellar, M.D.  [facility] Pulmonary & Critical Care Medicine  Medical Director [facility] [place]

## 2023-12-29 NOTE — Patient Instructions (Signed)
 Congratulations on your weight loss!!  Continue to use albuterol  as needed  Avoid Allergens and Irritants Avoid secondhand smoke Avoid SICK contacts Recommend  Masking  when appropriate Recommend Keep up-to-date with vaccinations

## 2023-12-30 ENCOUNTER — Other Ambulatory Visit: Payer: Self-pay | Admitting: Nurse Practitioner

## 2024-01-11 ENCOUNTER — Other Ambulatory Visit (HOSPITAL_COMMUNITY): Payer: Self-pay

## 2024-01-27 ENCOUNTER — Other Ambulatory Visit: Payer: Self-pay

## 2024-01-27 DIAGNOSIS — J309 Allergic rhinitis, unspecified: Secondary | ICD-10-CM

## 2024-01-27 MED ORDER — LORATADINE 10 MG PO TABS: 10.0000 mg | ORAL_TABLET | Freq: Every day | ORAL | 3 refills | Status: AC | PRN

## 2024-02-07 ENCOUNTER — Telehealth: Admitting: Physician Assistant

## 2024-02-07 DIAGNOSIS — R3989 Other symptoms and signs involving the genitourinary system: Secondary | ICD-10-CM | POA: Diagnosis not present

## 2024-02-07 MED ORDER — NITROFURANTOIN MONOHYD MACRO 100 MG PO CAPS: 100.0000 mg | ORAL_CAPSULE | Freq: Two times a day (BID) | ORAL | 0 refills | Status: AC

## 2024-02-07 NOTE — Progress Notes (Signed)

## 2024-02-23 ENCOUNTER — Ambulatory Visit

## 2024-02-29 ENCOUNTER — Telehealth: Payer: Self-pay

## 2024-02-29 ENCOUNTER — Encounter: Payer: Self-pay | Admitting: Adult Health

## 2024-02-29 NOTE — Telephone Encounter (Signed)
 Patient didn't answer, left a message stating I was trying to get verification for the pharmacy.

## 2024-02-29 NOTE — Telephone Encounter (Signed)
 Called patient to verify where she wanted the quilpta sent. PA was for [facility], but paper refill request was from [facility].

## 2024-03-01 NOTE — Telephone Encounter (Signed)
 Pt called  back returning  call   Pt stated Medication Atogepant  (QULIPTA ) 60 MG TABS  is  to be sent to    [facility] Address: [address] [place], [place], [place] [address] [phone]

## 2024-03-02 ENCOUNTER — Other Ambulatory Visit: Payer: Self-pay

## 2024-03-02 ENCOUNTER — Ambulatory Visit

## 2024-03-02 DIAGNOSIS — G43009 Migraine without aura, not intractable, without status migrainosus: Secondary | ICD-10-CM

## 2024-03-02 MED ORDER — QULIPTA 60 MG PO TABS: 60.0000 mg | ORAL_TABLET | Freq: Every day | ORAL | 2 refills | Status: DC

## 2024-03-08 ENCOUNTER — Ambulatory Visit: Admitting: *Deleted

## 2024-03-08 DIAGNOSIS — Z3042 Encounter for surveillance of injectable contraceptive: Secondary | ICD-10-CM

## 2024-03-08 NOTE — Progress Notes (Signed)
 Date last pap: 10/21/23. Last Depo-Provera : 12/08/23. Side Effects if any: none. Serum HCG indicated? N/a. Depo-Provera  150 mg IM given by: S.Lavayah Vita, [place] in [place]. Pt tolerated well. Next appointment due 4/8-4/22/26.   Administrations This Visit     medroxyPROGESTERone  (DEPO-PROVERA ) injection 150 mg     Admin Date 03/08/2024 Action Given Dose 150 mg Route Intramuscular Documented By Jerrye Elvie BIRCH, CMA

## 2024-03-08 NOTE — Telephone Encounter (Signed)
 Pt confirmed yes, to using [facility]

## 2024-03-09 ENCOUNTER — Encounter: Payer: Self-pay | Admitting: Nurse Practitioner

## 2024-03-09 ENCOUNTER — Other Ambulatory Visit: Payer: Self-pay

## 2024-03-09 DIAGNOSIS — G43009 Migraine without aura, not intractable, without status migrainosus: Secondary | ICD-10-CM

## 2024-03-09 DIAGNOSIS — E119 Type 2 diabetes mellitus without complications: Secondary | ICD-10-CM

## 2024-03-09 MED ORDER — TIRZEPATIDE 5 MG/0.5ML ~~LOC~~ SOAJ: 5.0000 mg | SUBCUTANEOUS | 0 refills | Status: AC

## 2024-03-09 MED ORDER — QULIPTA 60 MG PO TABS: 60.0000 mg | ORAL_TABLET | Freq: Every day | ORAL | 2 refills | Status: AC

## 2024-04-07 ENCOUNTER — Ambulatory Visit: Admitting: Nurse Practitioner

## 2024-05-24 ENCOUNTER — Ambulatory Visit: Payer: Self-pay | Admitting: Rheumatology
